# Patient Record
Sex: Female | Born: 1965 | Race: Black or African American | Hispanic: No | Marital: Single | Smoking: Former smoker
Health system: Southern US, Community
[De-identification: ages and names within clinical notes are randomized; demographics above are authoritative.]

## PROBLEM LIST (undated history)

## (undated) DIAGNOSIS — G4730 Sleep apnea, unspecified: Secondary | ICD-10-CM

## (undated) DIAGNOSIS — I272 Pulmonary hypertension, unspecified: Secondary | ICD-10-CM

## (undated) DIAGNOSIS — Z803 Family history of malignant neoplasm of breast: Secondary | ICD-10-CM

## (undated) DIAGNOSIS — I10 Essential (primary) hypertension: Secondary | ICD-10-CM

## (undated) DIAGNOSIS — F32A Depression, unspecified: Secondary | ICD-10-CM

## (undated) DIAGNOSIS — C801 Malignant (primary) neoplasm, unspecified: Secondary | ICD-10-CM

## (undated) DIAGNOSIS — F419 Anxiety disorder, unspecified: Secondary | ICD-10-CM

## (undated) DIAGNOSIS — J449 Chronic obstructive pulmonary disease, unspecified: Secondary | ICD-10-CM

## (undated) DIAGNOSIS — S83249A Other tear of medial meniscus, current injury, unspecified knee, initial encounter: Secondary | ICD-10-CM

## (undated) DIAGNOSIS — Z87828 Personal history of other (healed) physical injury and trauma: Secondary | ICD-10-CM

## (undated) DIAGNOSIS — D8685 Sarcoid myocarditis: Secondary | ICD-10-CM

## (undated) DIAGNOSIS — M1990 Unspecified osteoarthritis, unspecified site: Secondary | ICD-10-CM

## (undated) DIAGNOSIS — R0600 Dyspnea, unspecified: Secondary | ICD-10-CM

## (undated) DIAGNOSIS — Z780 Asymptomatic menopausal state: Secondary | ICD-10-CM

## (undated) DIAGNOSIS — Z800 Family history of malignant neoplasm of digestive organs: Secondary | ICD-10-CM

## (undated) DIAGNOSIS — I509 Heart failure, unspecified: Secondary | ICD-10-CM

## (undated) DIAGNOSIS — M797 Fibromyalgia: Secondary | ICD-10-CM

## (undated) DIAGNOSIS — F319 Bipolar disorder, unspecified: Secondary | ICD-10-CM

## (undated) DIAGNOSIS — I219 Acute myocardial infarction, unspecified: Secondary | ICD-10-CM

## (undated) HISTORY — DX: Pulmonary hypertension, unspecified: I27.20

## (undated) HISTORY — DX: Anxiety disorder, unspecified: F41.9

## (undated) HISTORY — PX: OTHER SURGICAL HISTORY: SHX169

## (undated) HISTORY — DX: Family history of malignant neoplasm of breast: Z80.3

## (undated) HISTORY — DX: Bipolar disorder, unspecified: F31.9

## (undated) HISTORY — DX: Depression, unspecified: F32.A

## (undated) HISTORY — DX: Essential (primary) hypertension: I10

## (undated) HISTORY — PX: TUBAL LIGATION: SHX77

## (undated) HISTORY — DX: Sarcoid myocarditis: D86.85

## (undated) HISTORY — DX: Family history of malignant neoplasm of digestive organs: Z80.0

## (undated) HISTORY — DX: Other tear of medial meniscus, current injury, unspecified knee, initial encounter: S83.249A

## (undated) HISTORY — PX: LUNG BIOPSY: SHX232

## (undated) HISTORY — DX: Fibromyalgia: M79.7

## (undated) HISTORY — PX: CARDIAC CATHETERIZATION: SHX172

## (undated) HISTORY — DX: Asymptomatic menopausal state: Z78.0

## (undated) HISTORY — DX: Personal history of other (healed) physical injury and trauma: Z87.828

## (undated) HISTORY — PX: UPPER GI ENDOSCOPY: SHX6162

---

## 2010-01-07 DIAGNOSIS — I272 Pulmonary hypertension, unspecified: Secondary | ICD-10-CM | POA: Insufficient documentation

## 2016-02-16 DIAGNOSIS — F319 Bipolar disorder, unspecified: Secondary | ICD-10-CM | POA: Insufficient documentation

## 2020-06-07 DIAGNOSIS — R002 Palpitations: Secondary | ICD-10-CM | POA: Diagnosis not present

## 2020-06-07 DIAGNOSIS — Z95818 Presence of other cardiac implants and grafts: Secondary | ICD-10-CM | POA: Diagnosis not present

## 2020-06-16 DIAGNOSIS — I272 Pulmonary hypertension, unspecified: Secondary | ICD-10-CM | POA: Diagnosis not present

## 2020-06-16 DIAGNOSIS — Z862 Personal history of diseases of the blood and blood-forming organs and certain disorders involving the immune mechanism: Secondary | ICD-10-CM | POA: Diagnosis not present

## 2020-06-16 DIAGNOSIS — M069 Rheumatoid arthritis, unspecified: Secondary | ICD-10-CM | POA: Diagnosis not present

## 2020-06-16 DIAGNOSIS — E559 Vitamin D deficiency, unspecified: Secondary | ICD-10-CM | POA: Diagnosis not present

## 2020-06-16 DIAGNOSIS — Z1159 Encounter for screening for other viral diseases: Secondary | ICD-10-CM | POA: Diagnosis not present

## 2020-06-16 DIAGNOSIS — Z87898 Personal history of other specified conditions: Secondary | ICD-10-CM | POA: Diagnosis not present

## 2020-06-16 DIAGNOSIS — Z79899 Other long term (current) drug therapy: Secondary | ICD-10-CM | POA: Diagnosis not present

## 2020-06-16 DIAGNOSIS — Z0000 Encounter for general adult medical examination without abnormal findings: Secondary | ICD-10-CM | POA: Diagnosis not present

## 2020-08-03 ENCOUNTER — Telehealth: Payer: Self-pay

## 2020-08-03 NOTE — Telephone Encounter (Signed)
Progress notes on file sent from: [facility], phone no. [phone]. Copy of referral order sent to scheduling.

## 2020-08-09 ENCOUNTER — Institutional Professional Consult (permissible substitution): Payer: Medicare HMO | Admitting: Internal Medicine

## 2020-08-09 DIAGNOSIS — R002 Palpitations: Secondary | ICD-10-CM | POA: Diagnosis not present

## 2020-08-09 DIAGNOSIS — Z95818 Presence of other cardiac implants and grafts: Secondary | ICD-10-CM | POA: Diagnosis not present

## 2020-08-11 DIAGNOSIS — Z113 Encounter for screening for infections with a predominantly sexual mode of transmission: Secondary | ICD-10-CM | POA: Diagnosis not present

## 2020-08-11 DIAGNOSIS — Z1211 Encounter for screening for malignant neoplasm of colon: Secondary | ICD-10-CM | POA: Diagnosis not present

## 2020-08-11 DIAGNOSIS — R6889 Other general symptoms and signs: Secondary | ICD-10-CM | POA: Diagnosis not present

## 2020-08-11 DIAGNOSIS — Z01411 Encounter for gynecological examination (general) (routine) with abnormal findings: Secondary | ICD-10-CM | POA: Diagnosis not present

## 2020-08-11 DIAGNOSIS — Z6841 Body Mass Index (BMI) 40.0 and over, adult: Secondary | ICD-10-CM | POA: Diagnosis not present

## 2020-08-11 DIAGNOSIS — Z124 Encounter for screening for malignant neoplasm of cervix: Secondary | ICD-10-CM | POA: Diagnosis not present

## 2020-08-11 DIAGNOSIS — Z1231 Encounter for screening mammogram for malignant neoplasm of breast: Secondary | ICD-10-CM | POA: Diagnosis not present

## 2020-08-11 DIAGNOSIS — Z01419 Encounter for gynecological examination (general) (routine) without abnormal findings: Secondary | ICD-10-CM | POA: Diagnosis not present

## 2020-08-16 DIAGNOSIS — G4730 Sleep apnea, unspecified: Secondary | ICD-10-CM | POA: Diagnosis not present

## 2020-08-16 DIAGNOSIS — M5459 Other low back pain: Secondary | ICD-10-CM | POA: Diagnosis not present

## 2020-08-16 DIAGNOSIS — M25569 Pain in unspecified knee: Secondary | ICD-10-CM | POA: Diagnosis not present

## 2020-08-16 DIAGNOSIS — M2550 Pain in unspecified joint: Secondary | ICD-10-CM | POA: Diagnosis not present

## 2020-08-16 DIAGNOSIS — M797 Fibromyalgia: Secondary | ICD-10-CM | POA: Diagnosis not present

## 2020-08-16 DIAGNOSIS — D869 Sarcoidosis, unspecified: Secondary | ICD-10-CM | POA: Diagnosis not present

## 2020-08-16 DIAGNOSIS — M542 Cervicalgia: Secondary | ICD-10-CM | POA: Diagnosis not present

## 2020-08-16 DIAGNOSIS — R2689 Other abnormalities of gait and mobility: Secondary | ICD-10-CM | POA: Diagnosis not present

## 2020-08-16 DIAGNOSIS — J449 Chronic obstructive pulmonary disease, unspecified: Secondary | ICD-10-CM | POA: Diagnosis not present

## 2020-08-24 DIAGNOSIS — R2689 Other abnormalities of gait and mobility: Secondary | ICD-10-CM | POA: Diagnosis not present

## 2020-08-24 DIAGNOSIS — J449 Chronic obstructive pulmonary disease, unspecified: Secondary | ICD-10-CM | POA: Diagnosis not present

## 2020-09-01 ENCOUNTER — Institutional Professional Consult (permissible substitution): Payer: Medicare HMO | Admitting: Pulmonary Disease

## 2020-09-01 NOTE — Progress Notes (Deleted)
Synopsis: Referred in July 2022 for Sarcoidosis, self-referral  Subjective:   PATIENT ID: Julie Cruz GENDER: female DOB: August 09, 1965, MRN: [id number]   HPI  No chief complaint on file.   Julie Cruz is a 55 year old woman,   No past medical history on file.   No family history on file.   Social History   Socioeconomic History   Marital status: Not on file    Spouse name: Not on file   Number of children: Not on file   Years of education: Not on file   Highest education level: Not on file  Occupational History   Not on file  Tobacco Use   Smoking status: Not on file   Smokeless tobacco: Not on file  Substance and Sexual Activity   Alcohol use: Not on file   Drug use: Not on file   Sexual activity: Not on file  Other Topics Concern   Not on file  Social History Narrative   Not on file   Social Determinants of Health   Financial Resource Strain: Not on file  Food Insecurity: Not on file  Transportation Needs: Not on file  Physical Activity: Not on file  Stress: Not on file  Social Connections: Not on file  Intimate Partner Violence: Not on file     Not on File   No outpatient medications prior to visit.   No facility-administered medications prior to visit.    Review of Systems  Constitutional:  Negative for chills, fever, malaise/fatigue and weight loss.  HENT:  Negative for congestion, sinus pain and sore throat.   Eyes: Negative.   Respiratory:  Negative for cough, hemoptysis, sputum production, shortness of breath and wheezing.   Cardiovascular:  Negative for chest pain, palpitations, orthopnea, claudication and leg swelling.  Gastrointestinal:  Negative for abdominal pain, heartburn, nausea and vomiting.  Genitourinary: Negative.   Musculoskeletal:  Negative for joint pain and myalgias.  Skin:  Negative for rash.  Neurological:  Negative for weakness.  Endo/Heme/Allergies: Negative.   Psychiatric/Behavioral: Negative.        Objective:  There were no vitals filed for this visit.   Physical Exam Constitutional:      General: She is not in acute distress.    Appearance: She is not ill-appearing.  HENT:     Head: Normocephalic and atraumatic.  Eyes:     General: No scleral icterus.    Conjunctiva/sclera: Conjunctivae normal.     Pupils: Pupils are equal, round, and reactive to light.  Cardiovascular:     Rate and Rhythm: Normal rate and regular rhythm.     Pulses: Normal pulses.     Heart sounds: Normal heart sounds. No murmur heard. Pulmonary:     Effort: Pulmonary effort is normal.     Breath sounds: Normal breath sounds. No wheezing, rhonchi or rales.  Abdominal:     General: Bowel sounds are normal.     Palpations: Abdomen is soft.  Musculoskeletal:     Right lower leg: No edema.     Left lower leg: No edema.  Lymphadenopathy:     Cervical: No cervical adenopathy.  Skin:    General: Skin is warm and dry.  Neurological:     General: No focal deficit present.     Mental Status: She is alert.  Psychiatric:        Mood and Affect: Mood normal.        Behavior: Behavior normal.  Thought Content: Thought content normal.        Judgment: Judgment normal.      CBC No results found for: WBC, RBC, HGB, HCT, PLT, MCV, MCH, MCHC, RDW, LYMPHSABS, MONOABS, EOSABS, BASOSABS   Chest imaging:  PFT: No flowsheet data found.  Labs:  Path:  Echo:  Heart Catheterization:       Assessment & Plan:   No diagnosis found.  Discussion: ***   No current outpatient medications on file.

## 2020-09-09 DIAGNOSIS — Z95818 Presence of other cardiac implants and grafts: Secondary | ICD-10-CM | POA: Diagnosis not present

## 2020-09-09 DIAGNOSIS — R002 Palpitations: Secondary | ICD-10-CM | POA: Diagnosis not present

## 2020-09-14 ENCOUNTER — Other Ambulatory Visit: Payer: Self-pay

## 2020-09-15 ENCOUNTER — Ambulatory Visit: Payer: Medicare HMO | Admitting: Cardiology

## 2020-09-15 ENCOUNTER — Encounter: Payer: Self-pay | Admitting: Cardiology

## 2020-09-15 ENCOUNTER — Other Ambulatory Visit: Payer: Self-pay

## 2020-09-15 VITALS — BP 118/80 | HR 83 | Ht 71.0 in | Wt 366.0 lb

## 2020-09-15 DIAGNOSIS — R0609 Other forms of dyspnea: Secondary | ICD-10-CM

## 2020-09-15 DIAGNOSIS — D869 Sarcoidosis, unspecified: Secondary | ICD-10-CM | POA: Diagnosis not present

## 2020-09-15 DIAGNOSIS — R55 Syncope and collapse: Secondary | ICD-10-CM | POA: Diagnosis not present

## 2020-09-15 DIAGNOSIS — Z95818 Presence of other cardiac implants and grafts: Secondary | ICD-10-CM | POA: Insufficient documentation

## 2020-09-15 DIAGNOSIS — R0600 Dyspnea, unspecified: Secondary | ICD-10-CM

## 2020-09-15 NOTE — Patient Instructions (Signed)
Medication Instructions:  Your physician recommends that you continue on your current medications as directed. Please refer to the Current Medication list given to you today.  *If you need a refill on your cardiac medications before your next appointment, please call your pharmacy*   Lab Work: None If you have labs (blood work) drawn today and your tests are completely normal, you will receive your results only by: MyChart Message (if you have MyChart) OR A paper copy in the mail If you have any lab test that is abnormal or we need to change your treatment, we will call you to review the results.   Testing/Procedures: Your physician has requested that you have an echocardiogram. Echocardiography is a painless test that uses sound waves to create images of your heart. It provides your doctor with information about the size and shape of your heart and how well your heart's chambers and valves are working. This procedure takes approximately one hour. There are no restrictions for this procedure.    Follow-Up: At [facility], you and your health needs are our priority.  As part of our continuing mission to provide you with exceptional heart care, we have created designated Provider Care Teams.  These Care Teams include your primary Cardiologist (physician) and Advanced Practice Providers (APPs -  Physician Assistants and Nurse Practitioners) who all work together to provide you with the care you need, when you need it.  We recommend signing up for the patient portal called "MyChart".  Sign up information is provided on this After Visit Summary.  MyChart is used to connect with patients for Virtual Visits (Telemedicine).  Patients are able to view lab/test results, encounter notes, upcoming appointments, etc.  Non-urgent messages can be sent to your provider as well.   To learn more about what you can do with MyChart, go to [email or web address].    Your next appointment:   2  month(s)  The format for your next appointment:   In Person  Provider:   Robert Krasowski, MD   Other Instructions  Echocardiogram An echocardiogram is a test that uses sound waves (ultrasound) to produce images of the heart. Images from an echocardiogram can provide important information about: Heart size and shape. The size and thickness and movement of your heart's walls. Heart muscle function and strength. Heart valve function or if you have stenosis. Stenosis is when the heart valves are too narrow. If blood is flowing backward through the heart valves (regurgitation). A tumor or infectious growth around the heart valves. Areas of heart muscle that are not working well because of poor blood flow or injury from a heart attack. Aneurysm detection. An aneurysm is a weak or damaged part of an artery wall. The wall bulges out from the normal force of blood pumping through the body. Tell a health care provider about: Any allergies you have. All medicines you are taking, including vitamins, herbs, eye drops, creams, and over-the-counter medicines. Any blood disorders you have. Any surgeries you have had. Any medical conditions you have. Whether you are pregnant or may be pregnant. What are the risks? Generally, this is a safe test. However, problems may occur, including an allergic reaction to dye (contrast) that may be used during the test. What happens before the test? No specific preparation is needed. You may eat and drink normally. What happens during the test?  You will take off your clothes from the waist up and put on a hospital gown. Electrodes or electrocardiogram (ECG)patches may be placed   on your chest. The electrodes or patches are then connected to a device that monitors your heart rate and rhythm. You will lie down on a table for an ultrasound exam. A gel will be applied to your chest to help sound waves pass through your skin. A handheld device, called a  transducer, will be pressed against your chest and moved over your heart. The transducer produces sound waves that travel to your heart and bounce back (or "echo" back) to the transducer. These sound waves will be captured in real-time and changed into images of your heart that can be viewed on a video monitor. The images will be recorded on a computer and reviewed by your health care provider. You may be asked to change positions or hold your breath for a short time. This makes it easier to get different views or better views of your heart. In some cases, you may receive contrast through an IV in one of your veins. This can improve the quality of the pictures from your heart. The procedure may vary among health care providers and hospitals. What can I expect after the test? You may return to your normal, everyday life, including diet, activities, and medicines, unless your health care provider tells you not to do that. Follow these instructions at home: It is up to you to get the results of your test. Ask your health care provider, or the department that is doing the test, when your results will be ready. Keep all follow-up visits. This is important. Summary An echocardiogram is a test that uses sound waves (ultrasound) to produce images of the heart. Images from an echocardiogram can provide important information about the size and shape of your heart, heart muscle function, heart valve function, and other possible heart problems. You do not need to do anything to prepare before this test. You may eat and drink normally. After the echocardiogram is completed, you may return to your normal, everyday life, unless your health care provider tells you not to do that. This information is not intended to replace advice given to you by your health care provider. Make sure you discuss any questions you have with your health care provider. Document Revised: 10/07/2019 Document Reviewed: 10/07/2019 Elsevier  Patient Education  2022 Elsevier Inc.   

## 2020-09-15 NOTE — Progress Notes (Signed)
Cardiology Consultation:    Date:  09/15/2020   ID:  Julie Cruz, DOB 08-19-1965, MRN [id number]  PCP:  Leilani Able, MD  Cardiologist:  Gypsy Balsam, MD   Referring MD: Eliezer Lofts, MD   Chief Complaint  Patient presents with   Loss of Consciousness   Chest Pain    History of Present Illness:    Julie Cruz is a 55 y.o. female who is being seen today for the evaluation of sarcoidosis, cardiac involvement at the request of Eliezer Lofts, MD. she is a lady who recently relocated from [place].  She was taking care of by [facility].  Her diagnoses include sarcoidosis, there is a pulmonary involvement as well as eye involvement of this disease.  She has been followed by cardiology from [place] I do have a pet scan from 2020 showing no activity within the heart, I do have also report of cardiac catheterization from 2019 showing no obstructive disease.  She does have implantable loop recorder because of episode of syncope.  It looks like this is a [employer] device.  She would like to be established as a patient in my practice.  She did have a lot of complaints she complained of being tired exhausted short of breath.  She complained of having multiple episode of syncope she said for years she has been passing out and this is one of the reason why implantable loop recorder has been inserted however it looks like so far no significant arrhythmia identified.  She said syncope typically happen when she tried to get up quickly and walk.  It never happened when she laid down does not happen too often when she is sitting down.  She never injured herself, she usually feels that this sensation is coming takes couple seconds to a minute before eventually she ended up passing.  She described also to have atypical chest pain not related to exercise sharp stabbing. She does not exercise on the regular basis She does not smoke She is not on any special diet  Past  Medical History:  Diagnosis Date   Acute medial meniscus tear    Anxiety    Bipolar 1 disorder (HCC)    Depression    Fibromyalgia    H/O spinal cord injury    T, L ans CSpine   Hypertension    Menopause    Pulmonary hypertension (HCC)    Sarcoid, cardiac       Current Medications: Current Meds  Medication Sig   acetaminophen (TYLENOL) 650 MG CR tablet Take 650 mg by mouth every 8 (eight) hours as needed for pain.   albuterol (VENTOLIN HFA) 108 (90 Base) MCG/ACT inhaler Inhale 2 puffs into the lungs every 6 (six) hours as needed for wheezing or shortness of breath.   aspirin 325 MG tablet Take 325 mg by mouth daily.   Brimonidine Tartrate (ALPHAGAN P OP) Apply 5 mLs to eye daily at 6 (six) AM.   clonazePAM (KLONOPIN) 0.5 MG tablet Take 0.5 mg by mouth daily.   cyclobenzaprine (FLEXERIL) 10 MG tablet Take 10 mg by mouth 3 (three) times daily as needed for muscle spasms.   hydrochlorothiazide (HYDRODIURIL) 50 MG tablet Take 50 mg by mouth daily.   lisinopril (ZESTRIL) 40 MG tablet Take 40 mg by mouth daily.   methocarbamol (ROBAXIN) 750 MG tablet Take 750 mg by mouth every 8 (eight) hours as needed for muscle spasms.   naproxen (NAPROSYN) 500  MG tablet Take 500 mg by mouth daily.   pantoprazole (PROTONIX) 40 MG tablet Take 40 mg by mouth daily.   potassium chloride (KLOR-CON) 10 MEQ tablet Take 20 mEq by mouth daily.   prednisoLONE acetate (PRED FORTE) 1 % ophthalmic suspension Place 1 drop into both eyes as directed. 1-2 daily   sertraline (ZOLOFT) 100 MG tablet Take 200 mg by mouth daily.   traZODone (DESYREL) 50 MG tablet Take 50 mg by mouth at bedtime.   verapamil (VERELAN PM) 360 MG 24 hr capsule Take 360 mg by mouth at bedtime.     Allergies:   Patient has no known allergies.   Social History   Socioeconomic History   Marital status: Single    Spouse name: Not on file   Number of children: Not on file   Years of education: Not on file   Highest education level: Not  on file  Occupational History   Not on file  Tobacco Use   Smoking status: Former    Types: Cigarettes   Smokeless tobacco: Never  Vaping Use   Vaping Use: Never used  Substance and Sexual Activity   Alcohol use: Yes    Alcohol/week: 2.0 standard drinks    Types: 2 Glasses of wine per week    Comment: Daily   Drug use: Never   Sexual activity: Not Currently  Other Topics Concern   Not on file  Social History Narrative   Not on file   Social Determinants of Health   Financial Resource Strain: Not on file  Food Insecurity: Not on file  Transportation Needs: Not on file  Physical Activity: Not on file  Stress: Not on file  Social Connections: Not on file     Family History: The patient's family history includes Asthma in her mother; Cancer in her father; Depression in her brother and mother; HIV/AIDS in her brother; Heart attack in her brother; Hyperlipidemia in her mother; Rheum arthritis in her mother; Thyroid disease in her mother. ROS:   Please see the history of present illness.    All 14 point review of systems negative except as described per history of present illness.  EKGs/Labs/Other Studies Reviewed:    The following studies were reviewed today: I did review her PET scan that she presented to me done in 2020 showing no activity within the heart. I did review cardiac catheterization done in 2019 showed nonobstructive disease  EKG:  EKG is  ordered today.  The ekg ordered today demonstrates normal sinus rhythm normal P interval QT interval corrected is 493 which is prolonged.  Recent Labs: No results found for requested labs within last 8760 hours.  Recent Lipid Panel No results found for: CHOL, TRIG, HDL, CHOLHDL, VLDL, LDLCALC, LDLDIRECT  Physical Exam:    VS:  BP 118/80 (BP Location: Right Arm, Patient Position: Sitting)   Pulse 83   Ht 5\' 11"  (1.803 m)   Wt (!) 366 lb (166 kg)   SpO2 93%   BMI 51.05 kg/m     Wt Readings from Last 3 Encounters:   09/15/20 (!) 366 lb (166 kg)     GEN:  Well nourished, well developed in no acute distress HEENT: Normal NECK: No JVD; No carotid bruits LYMPHATICS: No lymphadenopathy CARDIAC: RRR, no murmurs, no rubs, no gallops RESPIRATORY:  Clear to auscultation without rales, wheezing or rhonchi  ABDOMEN: Soft, non-tender, non-distended MUSCULOSKELETAL:  No edema; No deformity  SKIN: Warm and dry NEUROLOGIC:  Alert and oriented x  3 PSYCHIATRIC:  Normal affect   ASSESSMENT:    1. DOE (dyspnea on exertion)   2. Sarcoidosis   3. Status post placement of implantable loop recorder   4. Syncope and collapse   5. Dyspnea on exertion   6. Morbid obesity (HCC)    PLAN:    In order of problems listed above:  Dyspnea on exertion multifactorial, pulmonary sarcoidosis, morbid obesity, but immobility all contribute to this problem.  I will ask her to have an echocardiogram to assess left ventricle ejection fraction. Sarcoidosis.  She is being scheduled to follow-up with pulmonary.  She also got some eye involvement she does have involvement with eye doctor.  She also carries diagnosis of sleep apnea.  That should be addressed by pulmonary team. Multiple syncope she does have implantable loop recorder so far did not reveal any significant arrhythmia however this is according to the patient I will contact her cardiologist from [facility] and try to get her record, will also schedule her to see our EP team for interrogation of her device.  This is already 53 years old device so she may be running out of battery.  I am concerned about her prolongation of QT interval noted on the EKG.  She takes some medications that can contribute to prolongation of QT and that include trazodone, Zoloft.  Those medication can cause prolongation of QT interval in certain situations.  Luckily again no arrhythmia is identified but of course types of syncope are very concerning. Morbid obesity: A problem.  She understand that she  is trying to work on losing weight   Medication Adjustments/Labs and Tests Ordered: Current medicines are reviewed at length with the patient today.  Concerns regarding medicines are outlined above.  Orders Placed This Encounter  Procedures   EKG 12-Lead   ECHOCARDIOGRAM COMPLETE   No orders of the defined types were placed in this encounter.   Signed, Georgeanna Lea, MD, [facility]. 09/15/2020 12:59 PM    [facility]

## 2020-09-22 DIAGNOSIS — R0602 Shortness of breath: Secondary | ICD-10-CM | POA: Diagnosis not present

## 2020-09-22 DIAGNOSIS — R6889 Other general symptoms and signs: Secondary | ICD-10-CM | POA: Diagnosis not present

## 2020-09-22 DIAGNOSIS — D869 Sarcoidosis, unspecified: Secondary | ICD-10-CM | POA: Diagnosis not present

## 2020-09-22 DIAGNOSIS — Z1211 Encounter for screening for malignant neoplasm of colon: Secondary | ICD-10-CM | POA: Diagnosis not present

## 2020-09-22 DIAGNOSIS — R079 Chest pain, unspecified: Secondary | ICD-10-CM | POA: Diagnosis not present

## 2020-09-22 DIAGNOSIS — K219 Gastro-esophageal reflux disease without esophagitis: Secondary | ICD-10-CM | POA: Diagnosis not present

## 2020-09-23 DIAGNOSIS — J449 Chronic obstructive pulmonary disease, unspecified: Secondary | ICD-10-CM | POA: Diagnosis not present

## 2020-09-23 DIAGNOSIS — R2689 Other abnormalities of gait and mobility: Secondary | ICD-10-CM | POA: Diagnosis not present

## 2020-09-28 ENCOUNTER — Other Ambulatory Visit: Payer: Self-pay

## 2020-09-28 ENCOUNTER — Ambulatory Visit (HOSPITAL_COMMUNITY): Payer: Medicare HMO | Attending: Cardiology

## 2020-09-28 DIAGNOSIS — R0600 Dyspnea, unspecified: Secondary | ICD-10-CM | POA: Insufficient documentation

## 2020-09-28 DIAGNOSIS — R6889 Other general symptoms and signs: Secondary | ICD-10-CM | POA: Diagnosis not present

## 2020-09-28 DIAGNOSIS — R0609 Other forms of dyspnea: Secondary | ICD-10-CM

## 2020-09-28 LAB — ECHOCARDIOGRAM COMPLETE
Area-P 1/2: 3.48 cm2
S' Lateral: 2.3 cm

## 2020-09-28 MED ORDER — PERFLUTREN LIPID MICROSPHERE
1.0000 mL | INTRAVENOUS | Status: AC | PRN
  Administered 2020-09-28: 3 mL via INTRAVENOUS

## 2020-09-29 ENCOUNTER — Other Ambulatory Visit: Payer: Self-pay | Admitting: Gastroenterology

## 2020-09-29 ENCOUNTER — Telehealth: Payer: Self-pay

## 2020-09-29 NOTE — Telephone Encounter (Signed)
   [facility] Pre-operative Risk Assessment    Patient Name: Julie Cruz  DOB: Dec 01, 1965 MRN: [id number]  HEARTCARE STAFF:  - IMPORTANT!!!!!! Under Visit Info/Reason for Call, type in Other and utilize the format Clearance MM/DD/YY or Clearance TBD. Do not use dashes or single digits. - Please review there is not already an duplicate clearance open for this procedure. - If request is for dental extraction, please clarify the # of teeth to be extracted. - If the patient is currently at the dentist's office, call Pre-Op Callback Staff (MA/nurse) to input urgent request.  - If the patient is not currently in the dentist office, please route to the Pre-Op pool.  Request for surgical clearance:  What type of surgery is being performed?  COLONOSCOPY / When is this surgery scheduled?  10/15/2020  What type of clearance is required (medical clearance vs. Pharmacy clearance to hold med vs. Both)? Both  Are there any medications that need to be held prior to surgery and how long? NA  Practice name and name of physician performing surgery? [facility] [place] Dr Beryle Beams, MD  What is the office phone number? [phone]   7.   What is the office fax number? [phone]  8.   Anesthesia type (None, local, MAC, general) ? Propofol   Toni Arthurs 09/29/2020, 2:16 PM  _________________________________________________________________   (provider comments below)

## 2020-09-29 NOTE — Telephone Encounter (Signed)
   Name: Julie Cruz  DOB: 11-Feb-1966  MRN: [id number]   Primary Cardiologist: None  Chart reviewed as part of pre-operative protocol coverage. Patient was contacted 09/29/2020 in reference to pre-operative risk assessment for pending surgery as outlined below.  BERNIECE ABID was last seen on 09/15/2020 by Dr. Bing Matter.  Since that day, LACHRISHA ZIEBARTH has been stable without significant chest pain.  Her baseline dyspnea on exertion is unchanged.  She has a history of sarcoidosis.  Previous cardiac catheterization in 2019 did not reveal significant coronary artery disease.  Recent echocardiogram performed on 09/28/2020 was also reassuring.  Therefore, based on ACC/AHA guidelines, the patient would be at acceptable risk for the planned procedure without further cardiovascular testing.   The patient was advised that if she develops new symptoms prior to surgery to contact our office to arrange for a follow-up visit, and she verbalized understanding.  I will route this recommendation to the requesting party via Epic fax function and remove from pre-op pool. Please call with questions.  [place], [place] 09/29/2020, 4:05 PM

## 2020-09-29 NOTE — Telephone Encounter (Signed)
Information to request medical records from Dr. Bethann Punches Electrophysiologist and Pulmonologist Dr. Miquel Dunn give to Julie Cruz to complete the request   EP doctor phone: [phone] fax#[phone] att [name]  Pulmonologist phone: [phone]  fax# [phone] att [name]

## 2020-10-06 ENCOUNTER — Ambulatory Visit (INDEPENDENT_AMBULATORY_CARE_PROVIDER_SITE_OTHER): Payer: Medicare HMO | Admitting: Pulmonary Disease

## 2020-10-06 ENCOUNTER — Other Ambulatory Visit: Payer: Self-pay

## 2020-10-06 ENCOUNTER — Encounter: Payer: Self-pay | Admitting: Pulmonary Disease

## 2020-10-06 ENCOUNTER — Telehealth: Payer: Self-pay | Admitting: Pulmonary Disease

## 2020-10-06 VITALS — BP 128/86 | HR 85 | Ht 71.0 in | Wt 362.0 lb

## 2020-10-06 DIAGNOSIS — D869 Sarcoidosis, unspecified: Secondary | ICD-10-CM | POA: Diagnosis not present

## 2020-10-06 DIAGNOSIS — R0602 Shortness of breath: Secondary | ICD-10-CM | POA: Diagnosis not present

## 2020-10-06 MED ORDER — ALBUTEROL SULFATE HFA 108 (90 BASE) MCG/ACT IN AERS
2.0000 | INHALATION_SPRAY | Freq: Four times a day (QID) | RESPIRATORY_TRACT | 3 refills | Status: DC | PRN
Start: 2020-10-06 — End: 2023-03-07

## 2020-10-06 NOTE — Progress Notes (Signed)
f  Synopsis: Referred in August 2022 for Sarcoidosis   Subjective:   PATIENT ID: Julie Cruz GENDER: female DOB: 1965-04-17, MRN: [id number]   HPI  Chief Complaint  Patient presents with   Consult    Self referral for sarcoidosis. Was diagnosed in 2007 in [place]. States she has noticed some increased SOB especially with exertion. Also has a history of OSA. Has a cpap machine but has not used it since it was under recall.     Julie Cruz is a 55 year old woman, former smoker with hypertension, pulmonary hypertension, fibromyalgia and sarcoidosis who is referred to pulmonary clinic for evaluation of sarcoidosis.  She reports being diagnosed with sarcoidosis at age 55 via lung biopsy in 2007 at [facility] in [place].  She was initially on prednisone for treatment but then due to side effects has stopped this therapy and has been weary of using methotrexate so she has not required systemic therapy over the last few years.  She denies progressive dyspnea since deciding not to have systemic treatment of her sarcoidosis. She is currently using albuterol twice daily for shortness of breath.  She was previously on Advair but did not like this inhaler.   She has history of obstructive sleep apnea and has a CPAP machine but has not used it in months as it is one of the machines included in the recent recall.  She has gained 20 to 40 pounds over the last couple of years.  Past Medical History:  Diagnosis Date   Acute medial meniscus tear    Anxiety    Bipolar 1 disorder (HCC)    Depression    Fibromyalgia    H/O spinal cord injury    T, L ans CSpine   Hypertension    Menopause    Pulmonary hypertension (HCC)    Sarcoid, cardiac      Family History  Problem Relation Age of Onset   Hyperlipidemia Mother    Asthma Mother    Thyroid disease Mother    Depression Mother    Rheum arthritis Mother    Cancer Father    Heart attack Brother    Depression Brother     HIV/AIDS Brother      Social History   Socioeconomic History   Marital status: Single    Spouse name: Not on file   Number of children: Not on file   Years of education: Not on file   Highest education level: Not on file  Occupational History   Not on file  Tobacco Use   Smoking status: Former    Types: Cigarettes   Smokeless tobacco: Never  Vaping Use   Vaping Use: Never used  Substance and Sexual Activity   Alcohol use: Yes    Alcohol/week: 2.0 standard drinks    Types: 2 Glasses of wine per week    Comment: Daily   Drug use: Never   Sexual activity: Not Currently  Other Topics Concern   Not on file  Social History Narrative   Not on file   Social Determinants of Health   Financial Resource Strain: Not on file  Food Insecurity: Not on file  Transportation Needs: Not on file  Physical Activity: Not on file  Stress: Not on file  Social Connections: Not on file  Intimate Partner Violence: Not on file     No Known Allergies   Outpatient Medications Prior to Visit  Medication Sig Dispense Refill   acetaminophen (TYLENOL) 650  MG CR tablet Take 650 mg by mouth every 8 (eight) hours as needed for pain.     albuterol (VENTOLIN HFA) 108 (90 Base) MCG/ACT inhaler Inhale 2 puffs into the lungs every 6 (six) hours as needed for wheezing or shortness of breath.     aspirin 325 MG tablet Take 325 mg by mouth daily.     Brimonidine Tartrate (ALPHAGAN P OP) Apply 5 mLs to eye daily at 6 (six) AM.     clonazePAM (KLONOPIN) 0.5 MG tablet Take 0.5 mg by mouth daily.     cyclobenzaprine (FLEXERIL) 10 MG tablet Take 10 mg by mouth 3 (three) times daily as needed for muscle spasms.     hydrochlorothiazide (HYDRODIURIL) 50 MG tablet Take 50 mg by mouth daily.     ketorolac (TORADOL) 30 MG/ML injection Inject 30 mg into the vein once.     lisinopril (ZESTRIL) 40 MG tablet Take 40 mg by mouth daily.     methocarbamol (ROBAXIN) 750 MG tablet Take 750 mg by mouth every 8 (eight) hours as  needed for muscle spasms.     naproxen (NAPROSYN) 500 MG tablet Take 500 mg by mouth daily.     pantoprazole (PROTONIX) 40 MG tablet Take 40 mg by mouth daily.     potassium chloride (KLOR-CON) 10 MEQ tablet Take 20 mEq by mouth daily.     prednisoLONE acetate (PRED FORTE) 1 % ophthalmic suspension Place 1 drop into both eyes as directed. 1-2 daily     sertraline (ZOLOFT) 100 MG tablet Take 200 mg by mouth daily.     traZODone (DESYREL) 50 MG tablet Take 50 mg by mouth at bedtime.     verapamil (VERELAN PM) 360 MG 24 hr capsule Take 360 mg by mouth at bedtime.     No facility-administered medications prior to visit.    Review of Systems  Constitutional:  Negative for chills, fever, malaise/fatigue and weight loss.  HENT:  Negative for congestion, sinus pain and sore throat.   Eyes: Negative.   Respiratory:  Positive for cough and shortness of breath. Negative for hemoptysis, sputum production and wheezing.   Cardiovascular:  Negative for chest pain, palpitations, orthopnea, claudication and leg swelling.  Gastrointestinal:  Negative for abdominal pain, heartburn, nausea and vomiting.  Genitourinary: Negative.   Musculoskeletal:  Positive for back pain and joint pain. Negative for myalgias.  Skin:  Negative for rash.  Neurological:  Negative for weakness.  Endo/Heme/Allergies: Negative.   Psychiatric/Behavioral: Negative.     Objective:   Vitals:   10/06/20 1132  BP: 128/86  Pulse: 85  SpO2: 98%  Weight: (!) 362 lb (164.2 kg)  Height: 5\' 11"  (1.803 m)   Physical Exam Constitutional:      General: She is not in acute distress.    Appearance: She is obese. She is not ill-appearing.  HENT:     Head: Normocephalic and atraumatic.     Nose: Nose normal.     Mouth/Throat:     Mouth: Mucous membranes are moist.  Eyes:     General: No scleral icterus.    Conjunctiva/sclera: Conjunctivae normal.     Pupils: Pupils are equal, round, and reactive to light.  Cardiovascular:      Rate and Rhythm: Normal rate and regular rhythm.     Pulses: Normal pulses.     Heart sounds: Normal heart sounds. No murmur heard. Pulmonary:     Effort: Pulmonary effort is normal.     Breath sounds: Decreased breath  sounds present. No wheezing, rhonchi or rales.  Abdominal:     General: Bowel sounds are normal.     Palpations: Abdomen is soft.  Musculoskeletal:     Right lower leg: No edema.     Left lower leg: No edema.  Lymphadenopathy:     Cervical: No cervical adenopathy.  Skin:    General: Skin is warm and dry.  Neurological:     General: No focal deficit present.     Mental Status: She is alert.  Psychiatric:        Mood and Affect: Mood normal.        Behavior: Behavior normal.        Thought Content: Thought content normal.        Judgment: Judgment normal.    CBC No results found for: WBC, RBC, HGB, HCT, PLT, MCV, MCH, MCHC, RDW, LYMPHSABS, MONOABS, EOSABS, BASOSABS   Chest imaging: MRI Cardiac 02/07/2019 - report reviewed from records from [place] Multiple conglomerated lymph nodes within the superior prevascular, right paratracheal and bilateral hilar, precarinal and subcarinal lymph nodes measuring 3.8 x 2.8cm para tracheal nodes. Bilateral multiple subpectoral and axillary lymph nodes measuring up to 1.1 x 0.8cm. New bilateral hypermetabolic diffuse groundglass and subcentimeter nodular opacities.   PFT: No flowsheet data found.  Echo 09/28/20: EF 70-75%. Mild LVH. Grade I diastolic dysfunction. RV systolic function is normal. RV size is normal. LA is mildly dilated.   EKG 09/15/20: Normal sinus rhythm, PR 160, QRS 94, Qtc 493  LHC 04/24/2017 - from [place] records LVEDP mildly elevated LV systolic function is normal  Assessment & Plan:   Sarcoidosis - Plan: Pulmonary Function Test, CT Chest High Resolution, Ambulatory referral to Ophthalmology, CANCELED: CT Chest High Resolution  Shortness of breath - Plan: CT Chest High Resolution  Discussion: Julie Cruz is a 55 year old woman, former smoker with hypertension, pulmonary hypertension, fibromyalgia and sarcoidosis who is referred to pulmonary clinic for evaluation of sarcoidosis.  She has reported history of sarcoidosis with confirmed lung biopsy from 2007 at [facility] in [place].  We will request records from her former pulmonologist.  On cardiac MRI from 2020 she does have notable paratracheal, hilar, precarinal and subcarinal along with axillary lymph nodes that are enlarged.  She also had bilateral diffuse groundglass and subcentimeter nodular opacities noted.  From 09/15/2020 showed normal sinus rhythm with normal intervals.  We will check a high-resolution CT scan based on her previous cardiac MRI findings.  We will also schedule her for pulmonary function testing.  We will refer her to ophthalmology for further assessment of her vision.  No plans for systemic therapy of her sarcoidosis at this time.  She can continue albuterol as needed.  Will refer her to our sleep medicine team for further evaluation and management of her obstructive sleep apnea.  We will refer her to medical weight loss management clinic for assistance with her obesity.  Follow-up in 3 months.  Melody Comas, MD [facility] Office: [phone]   Current Outpatient Medications:    acetaminophen (TYLENOL) 650 MG CR tablet, Take 650 mg by mouth every 8 (eight) hours as needed for pain., Disp: , Rfl:    albuterol (VENTOLIN HFA) 108 (90 Base) MCG/ACT inhaler, Inhale 2 puffs into the lungs every 6 (six) hours as needed for wheezing or shortness of breath., Disp: , Rfl:    aspirin 325 MG tablet, Take 325 mg by mouth daily., Disp: , Rfl:  Brimonidine Tartrate (ALPHAGAN P OP), Apply 5 mLs to eye daily at 6 (six) AM., Disp: , Rfl:    clonazePAM (KLONOPIN) 0.5 MG tablet, Take 0.5 mg by mouth daily., Disp: , Rfl:    cyclobenzaprine (FLEXERIL) 10 MG tablet, Take 10 mg by mouth 3  (three) times daily as needed for muscle spasms., Disp: , Rfl:    hydrochlorothiazide (HYDRODIURIL) 50 MG tablet, Take 50 mg by mouth daily., Disp: , Rfl:    ketorolac (TORADOL) 30 MG/ML injection, Inject 30 mg into the vein once., Disp: , Rfl:    lisinopril (ZESTRIL) 40 MG tablet, Take 40 mg by mouth daily., Disp: , Rfl:    methocarbamol (ROBAXIN) 750 MG tablet, Take 750 mg by mouth every 8 (eight) hours as needed for muscle spasms., Disp: , Rfl:    naproxen (NAPROSYN) 500 MG tablet, Take 500 mg by mouth daily., Disp: , Rfl:    pantoprazole (PROTONIX) 40 MG tablet, Take 40 mg by mouth daily., Disp: , Rfl:    potassium chloride (KLOR-CON) 10 MEQ tablet, Take 20 mEq by mouth daily., Disp: , Rfl:    prednisoLONE acetate (PRED FORTE) 1 % ophthalmic suspension, Place 1 drop into both eyes as directed. 1-2 daily, Disp: , Rfl:    sertraline (ZOLOFT) 100 MG tablet, Take 200 mg by mouth daily., Disp: , Rfl:    traZODone (DESYREL) 50 MG tablet, Take 50 mg by mouth at bedtime., Disp: , Rfl:    verapamil (VERELAN PM) 360 MG 24 hr capsule, Take 360 mg by mouth at bedtime., Disp: , Rfl:

## 2020-10-06 NOTE — Telephone Encounter (Signed)
Call made to patient, confirmed DOB. Patient states she was here today and requested that we send in refill of albuterol inhaler. Confirmed pharmacy, refill sent. Patient states she is going to find out what the nebulizer medication she use to use is and she will contact us once she has this information.   Nothing further needed at this time.

## 2020-10-06 NOTE — Patient Instructions (Addendum)
We will place a referral to our medical weight loss management clinic  We will check pulmonary function testing and a CT chest scan to further evaluate your shortness of breath.  We will request records from your former pulmonologist.   Continue albuterol as needed.  We will have you schedule an appointment with our sleep specialists for follow up of your sleep apnea.   We will refer you to ophthalmology

## 2020-10-07 ENCOUNTER — Encounter: Payer: Self-pay | Admitting: Pulmonary Disease

## 2020-10-07 ENCOUNTER — Telehealth: Payer: Self-pay | Admitting: Pulmonary Disease

## 2020-10-07 NOTE — Telephone Encounter (Signed)
Pt stated that for the medication; Duoneb 2.5 MG/ 3 mL solution she said that the pulmonologist from [facility], [facility]; Dr. Sherilyn Banker; must be attention [facility]; fax number [phone]. She signed a release form and she said that the nurse is taking care of it she said that the records should be able to received.   Pls regard; [phone]

## 2020-10-07 NOTE — Telephone Encounter (Signed)
JD please advise if you are ok with sending in the Duoneb 2.5/16ml that the pt was given by her pulmonologist in [place].  She is needing this refilled but it was not mentioned in her last OV note and it is not on her med list.     CP do you have her release form that she said she filled out and signed at her last OV?  Thanks

## 2020-10-08 ENCOUNTER — Telehealth: Payer: Self-pay | Admitting: Cardiology

## 2020-10-08 NOTE — Telephone Encounter (Signed)
Patient is requesting for results for her last test to be mail to her. Please advise

## 2020-10-08 NOTE — Telephone Encounter (Signed)
Ok to send in prescription for duoneb nebulizer solution.  Thanks, Cletis Athens

## 2020-10-08 NOTE — Telephone Encounter (Signed)
Spoke to patient just now and let her know that we could print these for her if she were to come by and sign a release for these or she could get on MyChart and print them herself. She states she will get on MyChart today.    Encouraged patient to call back with any questions or concerns.

## 2020-10-11 ENCOUNTER — Encounter (HOSPITAL_COMMUNITY): Payer: Self-pay | Admitting: Gastroenterology

## 2020-10-11 DIAGNOSIS — R002 Palpitations: Secondary | ICD-10-CM | POA: Diagnosis not present

## 2020-10-11 DIAGNOSIS — Z95818 Presence of other cardiac implants and grafts: Secondary | ICD-10-CM | POA: Diagnosis not present

## 2020-10-11 MED ORDER — IPRATROPIUM-ALBUTEROL 0.5-2.5 (3) MG/3ML IN SOLN: 3.0000 mL | Freq: Four times a day (QID) | RESPIRATORY_TRACT | 6 refills | Status: DC | PRN

## 2020-10-11 NOTE — Telephone Encounter (Signed)
Duoneb has been sent to the pharmacy.  Nothing further is needed.

## 2020-10-14 ENCOUNTER — Other Ambulatory Visit: Payer: Medicare HMO

## 2020-10-14 NOTE — Anesthesia Preprocedure Evaluation (Addendum)
Anesthesia Evaluation  Patient identified by MRN, date of birth, ID band Patient awake    Reviewed: Allergy & Precautions, NPO status , Patient's Chart, lab work & pertinent test results  Airway Mallampati: III  TM Distance: >3 FB Neck ROM: Full    Dental  (+) Teeth Intact   Pulmonary asthma ,  COPD inhaler, former smoker,    Pulmonary exam normal        Cardiovascular hypertension, Pt. on medications  Rhythm:Regular Rate:Normal  sarcoidosis   Neuro/Psych Anxiety Depression Bipolar Disorder negative neurological ROS     GI/Hepatic Neg liver ROS, GERD  Medicated,screening   Endo/Other  negative endocrine ROS  Renal/GU negative Renal ROS  negative genitourinary   Musculoskeletal  (+) Fibromyalgia -  Abdominal (+) + obese,  Abdomen: soft.    Peds  Hematology negative hematology ROS (+)   Anesthesia Other Findings   Reproductive/Obstetrics                            Anesthesia Physical Anesthesia Plan  ASA: 3  Anesthesia Plan: MAC   Post-op Pain Management:    Induction: Intravenous  PONV Risk Score and Plan: Ondansetron and Propofol infusion  Airway Management Planned: Simple Face Mask, Natural Airway and Nasal Cannula  Additional Equipment: None  Intra-op Plan:   Post-operative Plan:   Informed Consent: I have reviewed the patients History and Physical, chart, labs and discussed the procedure including the risks, benefits and alternatives for the proposed anesthesia with the patient or authorized representative who has indicated his/her understanding and acceptance.     Dental advisory given  Plan Discussed with: CRNA  Anesthesia Plan Comments:        Anesthesia Quick Evaluation

## 2020-10-15 ENCOUNTER — Encounter (HOSPITAL_COMMUNITY): Payer: Self-pay | Admitting: Gastroenterology

## 2020-10-15 ENCOUNTER — Ambulatory Visit (HOSPITAL_COMMUNITY): Payer: Medicare HMO | Admitting: Anesthesiology

## 2020-10-15 ENCOUNTER — Other Ambulatory Visit: Payer: Self-pay

## 2020-10-15 ENCOUNTER — Encounter (HOSPITAL_COMMUNITY)
Admission: RE | Disposition: A | Discharge status: Home / Self Care | Payer: Self-pay | Source: Home / Self Care | Attending: Gastroenterology

## 2020-10-15 ENCOUNTER — Ambulatory Visit (HOSPITAL_COMMUNITY)
Admission: RE | Admit: 2020-10-15 | Discharge: 2020-10-15 | Disposition: A | Discharge status: Home / Self Care | Payer: Medicare HMO | Attending: Gastroenterology | Admitting: Gastroenterology

## 2020-10-15 DIAGNOSIS — Z1211 Encounter for screening for malignant neoplasm of colon: Secondary | ICD-10-CM | POA: Diagnosis not present

## 2020-10-15 DIAGNOSIS — I10 Essential (primary) hypertension: Secondary | ICD-10-CM | POA: Diagnosis not present

## 2020-10-15 DIAGNOSIS — I252 Old myocardial infarction: Secondary | ICD-10-CM | POA: Insufficient documentation

## 2020-10-15 DIAGNOSIS — F418 Other specified anxiety disorders: Secondary | ICD-10-CM | POA: Diagnosis not present

## 2020-10-15 DIAGNOSIS — K219 Gastro-esophageal reflux disease without esophagitis: Secondary | ICD-10-CM | POA: Insufficient documentation

## 2020-10-15 DIAGNOSIS — Z87891 Personal history of nicotine dependence: Secondary | ICD-10-CM | POA: Insufficient documentation

## 2020-10-15 DIAGNOSIS — Z79899 Other long term (current) drug therapy: Secondary | ICD-10-CM | POA: Insufficient documentation

## 2020-10-15 DIAGNOSIS — F319 Bipolar disorder, unspecified: Secondary | ICD-10-CM | POA: Diagnosis not present

## 2020-10-15 HISTORY — PX: COLONOSCOPY WITH PROPOFOL: SHX5780

## 2020-10-15 SURGERY — COLONOSCOPY WITH PROPOFOL
Anesthesia: Monitor Anesthesia Care

## 2020-10-15 MED ORDER — DEXMEDETOMIDINE (PRECEDEX) IN NS 20 MCG/5ML (4 MCG/ML) IV SYRINGE
PREFILLED_SYRINGE | INTRAVENOUS | Status: AC
  Filled 2020-10-15: qty 5

## 2020-10-15 MED ORDER — PROPOFOL 500 MG/50ML IV EMUL
INTRAVENOUS | Status: DC | PRN
  Administered 2020-10-15: 150 ug/kg/min via INTRAVENOUS

## 2020-10-15 MED ORDER — PROPOFOL 1000 MG/100ML IV EMUL
INTRAVENOUS | Status: AC
  Filled 2020-10-15: qty 100

## 2020-10-15 MED ORDER — LACTATED RINGERS IV SOLN
INTRAVENOUS | Status: AC | PRN
  Administered 2020-10-15: 1000 mL via INTRAVENOUS

## 2020-10-15 MED ORDER — GLYCOPYRROLATE 0.2 MG/ML IJ SOLN
INTRAMUSCULAR | Status: DC | PRN
  Administered 2020-10-15 (×2): .1 mg via INTRAVENOUS

## 2020-10-15 MED ORDER — LIDOCAINE HCL (CARDIAC) PF 100 MG/5ML IV SOSY
PREFILLED_SYRINGE | INTRAVENOUS | Status: DC | PRN
  Administered 2020-10-15: 100 mg via INTRAVENOUS

## 2020-10-15 SURGICAL SUPPLY — 21 items

## 2020-10-15 NOTE — Transfer of Care (Signed)
Immediate Anesthesia Transfer of Care Note  Patient: Julie Cruz  Procedure(s) Performed: COLONOSCOPY WITH PROPOFOL  Patient Location: PACU  Anesthesia Type:MAC  Level of Consciousness: drowsy and patient cooperative  Airway & Oxygen Therapy: Patient Spontanous Breathing and Patient connected to face mask oxygen  Post-op Assessment: Report given to RN and Post -op Vital signs reviewed and stable  Post vital signs: Reviewed and stable  Last Vitals:  Vitals Value Taken Time  BP    Temp    Pulse 97 10/15/20 0813  Resp 17 10/15/20 0813  SpO2 100 % 10/15/20 0813  Vitals shown include unvalidated device data.  Last Pain:  Vitals:   10/15/20 0720  TempSrc: Temporal  PainSc: 5          Complications: No notable events documented.

## 2020-10-15 NOTE — H&P (Signed)
Julie Cruz HPI: At this time the patient denies any problems with nausea, vomiting, fevers, chills, abdominal pain, diarrhea, hematochezia, melena, or dysphagia. The patient denies any known family history of colon cancers.  At times she will have some constipation.  She uses pantoprazole for her GERD.  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  A loop recorder was implanted in the past and she is followed by Cardiology.  On a routine basis she will have chest pain and  SOB.   Past Medical History:  Diagnosis Date   Acute medial meniscus tear    Anxiety    Bipolar 1 disorder (HCC)    Depression    Fibromyalgia    H/O spinal cord injury    T, L ans CSpine   Hypertension    Menopause    Pulmonary hypertension (HCC)    Sarcoid, cardiac     Past Surgical History:  Procedure Laterality Date   C Section     Catherization      x2   Etopic  pregnacy     LUNG BIOPSY     TUBAL LIGATION      Family History  Problem Relation Age of Onset   Hyperlipidemia Mother    Asthma Mother    Thyroid disease Mother    Depression Mother    Rheum arthritis Mother    Cancer Father    Heart attack Brother    Depression Brother    HIV/AIDS Brother     Social History:  reports that she has quit smoking. Her smoking use included cigarettes. She has never used smokeless tobacco. She reports current alcohol use of about 2.0 standard drinks per week. She reports that she does not use drugs.  Allergies: No Known Allergies  Medications: Scheduled: Continuous:  No results found for this or any previous visit (from the past 24 hour(s)).   No results found.  ROS:  As stated above in the HPI otherwise negative.  Height 5\' 11"  (1.803 m), weight (!) 162.4 kg.    PE: Gen: NAD, Alert and Oriented HEENT:  [place]/AT, EOMI Neck: Supple, no LAD Lungs: CTA Bilaterally CV: RRR without M/G/R ABD: Soft, NTND, +BS Ext: No C/C/E  Assessment/Plan: 1) Screening colonoscopy.  Julie Cruz  D 10/15/2020, 7:06 AM

## 2020-10-15 NOTE — Anesthesia Postprocedure Evaluation (Signed)
Anesthesia Post Note  Patient: Julie Cruz  Procedure(s) Performed: COLONOSCOPY WITH PROPOFOL     Patient location during evaluation: Endoscopy Anesthesia Type: MAC Level of consciousness: awake and alert Pain management: pain level controlled Vital Signs Assessment: post-procedure vital signs reviewed and stable Respiratory status: spontaneous breathing, nonlabored ventilation, respiratory function stable and patient connected to nasal cannula oxygen Cardiovascular status: stable and blood pressure returned to baseline Postop Assessment: no apparent nausea or vomiting Anesthetic complications: no   No notable events documented.  Last Vitals:  Vitals:   10/15/20 0820 10/15/20 0830  BP: 108/61 129/81  Pulse: 94 86  Resp: (!) 26 14  Temp:    SpO2: 96% 94%    Last Pain:  Vitals:   10/15/20 0830  TempSrc:   PainSc: 0-No pain                 [name] P Kiri Hinderliter

## 2020-10-15 NOTE — Op Note (Signed)
[facility] Patient Name: Julie Cruz Procedure Date: 10/15/2020 MRN: [id number] Attending MD: Jeani Hawking , MD Date of Birth: Nov 07, 1965 CSN: [id number] Age: 55 Admit Type: Outpatient Procedure:                Colonoscopy Indications:              Screening for colorectal malignant neoplasm Providers:                Jeani Hawking, MD, Tillie Fantasia, RN, Michele Mcalpine Technician Referring MD:              Medicines:                Propofol per Anesthesia Complications:            No immediate complications. Estimated Blood Loss:     Estimated blood loss: none. Procedure:                Pre-Anesthesia Assessment:                           - Prior to the procedure, a History and Physical                            was performed, and patient medications and                            allergies were reviewed. The patient's tolerance of                            previous anesthesia was also reviewed. The risks                            and benefits of the procedure and the sedation                            options and risks were discussed with the patient.                            All questions were answered, and informed consent                            was obtained. Prior Anticoagulants: The patient has                            taken no previous anticoagulant or antiplatelet                            agents. ASA Grade Assessment: III - A patient with                            severe systemic disease. After reviewing the risks  and benefits, the patient was deemed in                            satisfactory condition to undergo the procedure.                           - Sedation was administered by an anesthesia                            professional. Deep sedation was attained.                           After obtaining informed consent, the colonoscope                            was passed under  direct vision. Throughout the                            procedure, the patient's blood pressure, pulse, and                            oxygen saturations were monitored continuously. The                            CF-HQ190L ([id number]) Olympus colonoscope was                            introduced through the anus and advanced to the the                            cecum, identified by appendiceal orifice and                            ileocecal valve. The patient tolerated the                            procedure well. The quality of the bowel                            preparation was good. The ileocecal valve,                            appendiceal orifice, and rectum were photographed.                            The colonoscopy was somewhat difficult due to                            significant looping. Successful completion of the                            procedure was aided by using manual pressure and  straightening and shortening the scope to obtain                            bowel loop reduction. Scope In: 7:46:36 AM Scope Out: 8:08:14 AM Scope Withdrawal Time: 0 hours 13 minutes 43 seconds  Total Procedure Duration: 0 hours 21 minutes 38 seconds  Findings:      The entire examined colon appeared normal. Impression:               - The entire examined colon is normal.                           - No specimens collected. Moderate Sedation:      Not Applicable - Patient had care per Anesthesia. Recommendation:           - Patient has a contact number available for                            emergencies. The signs and symptoms of potential                            delayed complications were discussed with the                            patient. Return to normal activities tomorrow.                            Written discharge instructions were provided to the                            patient.                           - Resume previous diet.                            - Continue present medications.                           - Repeat colonoscopy in 10 years for screening                            purposes. Procedure Code(s):        --- Professional ---                           [phone], Colonoscopy, flexible; diagnostic, including                            collection of specimen(s) by brushing or washing,                            when performed (separate procedure) Diagnosis Code(s):        --- Professional ---                           Z12.11, Encounter for screening for malignant  neoplasm of colon CPT copyright 2019 [employer] Association. All rights reserved. The codes documented in this report are preliminary and upon coder review may  be revised to meet current compliance requirements. Jeani Hawking, MD Jeani Hawking, MD 10/15/2020 8:19:49 AM This report has been signed electronically. Number of Addenda: 0

## 2020-10-15 NOTE — Discharge Instructions (Signed)

## 2020-10-16 ENCOUNTER — Telehealth (HOSPITAL_COMMUNITY): Payer: Self-pay | Admitting: Physician Assistant

## 2020-10-16 NOTE — Telephone Encounter (Signed)
Dry cough and wheezing since yesterday's colonoscopy.  Denies pain, nausea pre or post procedure.  Received propofol for sedation.  Is not in severe distress.   She has a pulmonologist  so I  suggested she place call to pulmonary team for advice.

## 2020-10-18 ENCOUNTER — Encounter (HOSPITAL_COMMUNITY): Payer: Self-pay | Admitting: Gastroenterology

## 2020-10-19 ENCOUNTER — Other Ambulatory Visit: Payer: Self-pay

## 2020-10-19 ENCOUNTER — Other Ambulatory Visit: Payer: Medicare HMO

## 2020-10-19 ENCOUNTER — Ambulatory Visit (INDEPENDENT_AMBULATORY_CARE_PROVIDER_SITE_OTHER)
Admission: RE | Admit: 2020-10-19 | Discharge: 2020-10-19 | Disposition: A | Discharge status: Home / Self Care | Payer: Medicare HMO | Source: Ambulatory Visit | Attending: Pulmonary Disease | Admitting: Pulmonary Disease

## 2020-10-19 DIAGNOSIS — D869 Sarcoidosis, unspecified: Secondary | ICD-10-CM | POA: Diagnosis not present

## 2020-10-19 DIAGNOSIS — R0602 Shortness of breath: Secondary | ICD-10-CM

## 2020-10-19 DIAGNOSIS — R6889 Other general symptoms and signs: Secondary | ICD-10-CM | POA: Diagnosis not present

## 2020-10-24 DIAGNOSIS — R2689 Other abnormalities of gait and mobility: Secondary | ICD-10-CM | POA: Diagnosis not present

## 2020-10-24 DIAGNOSIS — J449 Chronic obstructive pulmonary disease, unspecified: Secondary | ICD-10-CM | POA: Diagnosis not present

## 2020-10-25 DIAGNOSIS — G4730 Sleep apnea, unspecified: Secondary | ICD-10-CM | POA: Diagnosis not present

## 2020-10-25 DIAGNOSIS — R635 Abnormal weight gain: Secondary | ICD-10-CM | POA: Diagnosis not present

## 2020-10-25 DIAGNOSIS — D869 Sarcoidosis, unspecified: Secondary | ICD-10-CM | POA: Diagnosis not present

## 2020-10-25 DIAGNOSIS — H409 Unspecified glaucoma: Secondary | ICD-10-CM | POA: Diagnosis not present

## 2020-10-25 DIAGNOSIS — J449 Chronic obstructive pulmonary disease, unspecified: Secondary | ICD-10-CM | POA: Diagnosis not present

## 2020-10-25 DIAGNOSIS — F419 Anxiety disorder, unspecified: Secondary | ICD-10-CM | POA: Diagnosis not present

## 2020-10-25 DIAGNOSIS — M797 Fibromyalgia: Secondary | ICD-10-CM | POA: Diagnosis not present

## 2020-10-25 DIAGNOSIS — Z13220 Encounter for screening for lipoid disorders: Secondary | ICD-10-CM | POA: Diagnosis not present

## 2020-10-25 DIAGNOSIS — M5459 Other low back pain: Secondary | ICD-10-CM | POA: Diagnosis not present

## 2020-10-25 DIAGNOSIS — F329 Major depressive disorder, single episode, unspecified: Secondary | ICD-10-CM | POA: Diagnosis not present

## 2020-10-25 DIAGNOSIS — Z13228 Encounter for screening for other metabolic disorders: Secondary | ICD-10-CM | POA: Diagnosis not present

## 2020-11-10 ENCOUNTER — Telehealth: Payer: Self-pay | Admitting: Cardiology

## 2020-11-10 ENCOUNTER — Telehealth: Payer: Self-pay | Admitting: Pulmonary Disease

## 2020-11-10 DIAGNOSIS — Z95818 Presence of other cardiac implants and grafts: Secondary | ICD-10-CM

## 2020-11-10 NOTE — Telephone Encounter (Signed)
Patient called and was requesting a referral from Dr. Tomie China to one of our EP to monitor her pacemaker transmissions

## 2020-11-10 NOTE — Telephone Encounter (Signed)
Will forward to [facility] to follow up on this.

## 2020-11-10 NOTE — Telephone Encounter (Signed)
Patient calling to get results from CT scan.   Dr. Francine Graven please advise  A separate T/O has been sent to [facility] to follow up on the patients opthalmology referral.

## 2020-11-11 ENCOUNTER — Ambulatory Visit (INDEPENDENT_AMBULATORY_CARE_PROVIDER_SITE_OTHER): Payer: Medicare HMO

## 2020-11-11 DIAGNOSIS — R55 Syncope and collapse: Secondary | ICD-10-CM

## 2020-11-11 LAB — CUP PACEART REMOTE DEVICE CHECK
Date Time Interrogation Session: 20220914232718
Implantable Pulse Generator Implant Date: 20190226

## 2020-11-11 MED ORDER — BUDESONIDE-FORMOTEROL FUMARATE 160-4.5 MCG/ACT IN AERO: 2.0000 | INHALATION_SPRAY | Freq: Two times a day (BID) | RESPIRATORY_TRACT | 6 refills | Status: DC

## 2020-11-11 NOTE — Telephone Encounter (Signed)
I called and spoke with patient regarding Dr. Francine Graven recs on CT. Patient verbalized understanding and wants to try Symbicort. Patient has tried Advair and did not like how it made her feel. She also asked about referral to eye doctor and looks like it went to Dr. Lanna Poche. I informed patient that some offices are behind on referral and she can try to call their office to make an appt. She also asked about referral to weight loss clinic. I did not see a referral in for that so I went ahead and sent one in. I informed patient to call if she did not like symbicort and can try something else. Patient verbalized understanding, nothing further needed.

## 2020-11-11 NOTE — Telephone Encounter (Addendum)
[facility] & spoke to [place].  She states was just given list of patient's to call from supervisor & she wasn't given demographics but she is going to ask for them but asked if I would go ahead and fax to her.  Her fax # is [phone].  She states she will call pt today.  I faxed demographics and insurance cards.  Nothing further needed.

## 2020-11-11 NOTE — Telephone Encounter (Signed)
Patient's CT Chest scan is consistent with her previous findings on chest imaging with enlarged mediastinal lypmh nodes related to her diagnosis of sarcoidosis.   There are also findings of air trapping on the expiration phase of the scan which is indicative of small airways disease such as asthma.   Please send in prescription for ICS/LABA inhaler such as symbicort 160-4.59mcg 2 puffs twice daily, dulera 200-1mcg 2 puffs twice daily, adivair 230-23mcg 2 puffs twice daily or Breo ellipta 1 puff daily. Whichever is covered by her insurance. We will monitor for any improvement in her breathing symptoms.  Thanks, Melody Comas, MD [facility] Office: [phone]   See Amion for personal pager PCCM on call pager [phone] until 7pm. Please call Elink 7p-7a. [phone]

## 2020-11-15 NOTE — Telephone Encounter (Signed)
Spoke to the patient just now and let her know Dr. Krasowski's recommendations. She verbalizes understanding and thanks me for the call back.  

## 2020-11-16 ENCOUNTER — Ambulatory Visit: Payer: Medicare HMO | Admitting: Pulmonary Disease

## 2020-11-18 NOTE — Progress Notes (Signed)
Carelink Summary Report / Loop Recorder 

## 2020-11-24 DIAGNOSIS — R2689 Other abnormalities of gait and mobility: Secondary | ICD-10-CM | POA: Diagnosis not present

## 2020-11-24 DIAGNOSIS — J449 Chronic obstructive pulmonary disease, unspecified: Secondary | ICD-10-CM | POA: Diagnosis not present

## 2020-12-13 ENCOUNTER — Ambulatory Visit (INDEPENDENT_AMBULATORY_CARE_PROVIDER_SITE_OTHER): Payer: Medicare HMO

## 2020-12-13 DIAGNOSIS — R55 Syncope and collapse: Secondary | ICD-10-CM | POA: Diagnosis not present

## 2020-12-15 ENCOUNTER — Encounter: Payer: Self-pay | Admitting: Pulmonary Disease

## 2020-12-15 ENCOUNTER — Ambulatory Visit (INDEPENDENT_AMBULATORY_CARE_PROVIDER_SITE_OTHER): Payer: Medicare HMO | Admitting: Pulmonary Disease

## 2020-12-15 ENCOUNTER — Other Ambulatory Visit: Payer: Self-pay

## 2020-12-15 VITALS — BP 110/96 | HR 92 | Temp 98.3°F | Ht 70.0 in | Wt 365.2 lb

## 2020-12-15 DIAGNOSIS — D869 Sarcoidosis, unspecified: Secondary | ICD-10-CM | POA: Diagnosis not present

## 2020-12-15 DIAGNOSIS — G4733 Obstructive sleep apnea (adult) (pediatric): Secondary | ICD-10-CM

## 2020-12-15 DIAGNOSIS — Z23 Encounter for immunization: Secondary | ICD-10-CM

## 2020-12-15 DIAGNOSIS — R6889 Other general symptoms and signs: Secondary | ICD-10-CM | POA: Diagnosis not present

## 2020-12-15 LAB — PULMONARY FUNCTION TEST
DL/VA % pred: 128 %
DL/VA: 5.23 ml/min/mmHg/L
DLCO unc % pred: 81 %
DLCO unc: 21.12 ml/min/mmHg
FEF 25-75 Post: 1.07 L/sec
FEF 25-75 Pre: 1.25 L/sec
FEF2575-%Change-Post: -13 %
FEF2575-%Pred-Post: 37 %
FEF2575-%Pred-Pre: 43 %
FEV1-%Change-Post: -4 %
FEV1-%Pred-Post: 47 %
FEV1-%Pred-Pre: 49 %
FEV1-Post: 1.39 L
FEV1-Pre: 1.45 L
FEV1FVC-%Change-Post: 4 %
FEV1FVC-%Pred-Pre: 97 %
FEV6-%Change-Post: -12 %
FEV6-%Pred-Post: 44 %
FEV6-%Pred-Pre: 51 %
FEV6-Post: 1.6 L
FEV6-Pre: 1.83 L
FEV6FVC-%Change-Post: -4 %
FEV6FVC-%Pred-Post: 98 %
FEV6FVC-%Pred-Pre: 102 %
FVC-%Change-Post: -8 %
FVC-%Pred-Post: 45 %
FVC-%Pred-Pre: 50 %
FVC-Post: 1.68 L
FVC-Pre: 1.83 L
Post FEV1/FVC ratio: 83 %
Post FEV6/FVC ratio: 96 %
Pre FEV1/FVC ratio: 79 %
Pre FEV6/FVC Ratio: 100 %
RV % pred: 92 %
RV: 2.04 L
TLC % pred: 76 %
TLC: 4.68 L

## 2020-12-15 LAB — CUP PACEART REMOTE DEVICE CHECK
Date Time Interrogation Session: 20221017232855
Implantable Pulse Generator Implant Date: 20190226

## 2020-12-15 NOTE — Progress Notes (Signed)
PFT done today. 

## 2020-12-15 NOTE — Patient Instructions (Signed)
We will give you the pneumonia shot today  Schedule for in lab split-night study for obstructive sleep apnea  Record release for previous sleep study  I will see you in 2 to 3 months  You may reinitiate your machine use  Call with significant concerns  Try and develop a regular routine to your sleep habits

## 2020-12-15 NOTE — Progress Notes (Signed)
Julie Cruz    [id number]    1965/09/05  Primary Care Physician:Reese, Zadie Cleverly, MD  Referring Physician: Lin Landsman, [place],  [place] [id number]  Chief complaint:   Patient being seen for obstructive sleep apnea  HPI:  Diagnosis obstructive sleep apnea-likely severe a few years back She stopped using CPAP with the recall  She does have daytime sleepiness Snoring, dryness of her mouth in the morning  Sleep habit is poor Very variable sleep time goes to bed between 1 AM and 5 AM Usually requires trazodone to help her sleep Few awakenings Very variable waking up time  Admits to dryness of the mouth in the mornings Admits to occasional headaches Occasional night sweats Memory is poor  Both parents snored  She was very compliant with CPAP use and was benefiting from CPAP use Was not using any special cleaners, was not using so clean or any other devices Soapy water and hangs it out to dry   History of sarcoidosis, history of pulmonary hypertension History of fibromyalgia Reformed smoker  Had biopsy done in 2007 Was treated with methotrexate in the past  Has gained about 20 to 30 pounds  History of bipolar disorder  Outpatient Encounter Medications as of 12/15/2020  Medication Sig   acetaminophen (TYLENOL) 650 MG CR tablet Take 650 mg by mouth every 8 (eight) hours as needed for pain.   brimonidine (ALPHAGAN P) 0.1 % SOLN Place 1 drop into both eyes in the morning and at bedtime.   clonazePAM (KLONOPIN) 0.5 MG tablet Take 0.5 mg by mouth daily.   cyclobenzaprine (FLEXERIL) 10 MG tablet Take 10-20 mg by mouth 3 (three) times daily as needed for muscle spasms.   hydrochlorothiazide (HYDRODIURIL) 50 MG tablet Take 50 mg by mouth in the morning.   ipratropium-albuterol (DUONEB) 0.5-2.5 (3) MG/3ML SOLN Take 3 mLs by nebulization every 6 (six) hours as needed.   lisinopril (ZESTRIL) 40 MG tablet Take 40 mg by mouth in the morning.    Menthol (ICY HOT) 5 % PTCH Place 1 patch onto the skin daily as needed (pain.).   Menthol, Topical Analgesic, (ICY HOT EX) Apply 1 application topically 3 (three) times daily as needed (pain.).   methocarbamol (ROBAXIN) 750 MG tablet Take 750 mg by mouth every 8 (eight) hours as needed for muscle spasms.   naproxen (NAPROSYN) 500 MG tablet Take 1,000 mg by mouth daily as needed (pain.).   pantoprazole (PROTONIX) 40 MG tablet Take 40 mg by mouth in the morning.   potassium chloride (KLOR-CON) 10 MEQ tablet Take 20 mEq by mouth in the morning.   prednisoLONE acetate (PRED FORTE) 1 % ophthalmic suspension Place 1 drop into both eyes See admin instructions. Instill 1 drop into right eye scheduled twice daily for inflammation/pain & instil 1 drop into left eye up to twice daily if needed for redness/inflammation/pain.   sertraline (ZOLOFT) 100 MG tablet Take 200 mg by mouth in the morning.   traZODone (DESYREL) 50 MG tablet Take 50 mg by mouth at bedtime.   verapamil (VERELAN PM) 360 MG 24 hr capsule Take 360 mg by mouth in the morning.   albuterol (VENTOLIN HFA) 108 (90 Base) MCG/ACT inhaler Inhale 2 puffs into the lungs every 6 (six) hours as needed for wheezing or shortness of breath. (Patient taking differently: Inhale 2 puffs into the lungs See admin instructions. Inhale 2 puffs scheduled twice daily & inhale every 6 hours if  needed for wheezing/shortness of breath)   aspirin 325 MG tablet Take 325 mg by mouth daily as needed (pain/chest tightness). (Patient not taking: Reported on 12/15/2020)   budesonide-formoterol (SYMBICORT) 160-4.5 MCG/ACT inhaler Inhale 2 puffs into the lungs in the morning and at bedtime. (Patient not taking: Reported on 12/15/2020)   ketorolac (TORADOL) 10 MG tablet    SUPREP BOWEL PREP KIT 17.5-3.13-1.6 GM/177ML SOLN Take 354 mLs by mouth as directed. (Patient not taking: Reported on 12/15/2020)   No facility-administered encounter medications on file as of 12/15/2020.     Allergies as of 12/15/2020   (No Known Allergies)    Past Medical History:  Diagnosis Date   Acute medial meniscus tear    Anxiety    Bipolar 1 disorder (HCC)    Depression    Fibromyalgia    H/O spinal cord injury    T, L ans CSpine   Hypertension    Menopause    Pulmonary hypertension ([place])    Sarcoid, cardiac     Past Surgical History:  Procedure Laterality Date   C Section     Catherization      x2   COLONOSCOPY WITH PROPOFOL N/A 10/15/2020   Procedure: COLONOSCOPY WITH PROPOFOL;  Surgeon: Carol Ada, MD;  Location: [facility];  Service: Endoscopy;  Laterality: N/A;   Etopic  pregnacy     LUNG BIOPSY     TUBAL LIGATION      Family History  Problem Relation Age of Onset   Hyperlipidemia Mother    Asthma Mother    Thyroid disease Mother    Depression Mother    Rheum arthritis Mother    Cancer Father    Heart attack Brother    Depression Brother    HIV/AIDS Brother     Social History   Socioeconomic History   Marital status: Single    Spouse name: Not on file   Number of children: Not on file   Years of education: Not on file   Highest education level: Not on file  Occupational History   Not on file  Tobacco Use   Smoking status: Former    Types: Cigarettes   Smokeless tobacco: Never  Vaping Use   Vaping Use: Never used  Substance and Sexual Activity   Alcohol use: Yes    Alcohol/week: 2.0 standard drinks    Types: 2 Glasses of wine per week    Comment: Daily   Drug use: Never   Sexual activity: Not Currently  Other Topics Concern   Not on file  Social History Narrative   Not on file   Social Determinants of Health   Financial Resource Strain: Not on file  Food Insecurity: Not on file  Transportation Needs: Not on file  Physical Activity: Not on file  Stress: Not on file  Social Connections: Not on file  Intimate Partner Violence: Not on file    Review of Systems  Constitutional:  Positive for fatigue.  Respiratory:   Positive for apnea.   Psychiatric/Behavioral:  Positive for sleep disturbance.    Vitals:   12/15/20 1613  BP: (!) 110/96  Pulse: 92  Temp: 98.3 F (36.8 C)  SpO2: 95%     Physical Exam Constitutional:      Appearance: She is obese.  HENT:     Head: Normocephalic and atraumatic.     Nose: No congestion.     Mouth/Throat:     Mouth: Mucous membranes are moist.     Comments: Mallampati  4, crowded oropharynx, macroglossia Eyes:     Pupils: Pupils are equal, round, and reactive to light.  Cardiovascular:     Rate and Rhythm: Normal rate and regular rhythm.     Heart sounds: No murmur heard.   No friction rub.  Pulmonary:     Effort: No respiratory distress.     Breath sounds: No stridor. No wheezing or rhonchi.  Musculoskeletal:     Cervical back: No rigidity or tenderness.  Neurological:     Mental Status: She is alert.  Psychiatric:        Mood and Affect: Mood normal.   No flowsheet data found.   Data Reviewed: Previous study not available  Assessment:  History of obstructive sleep apnea -Not currently on CPAP therapy  I did discuss resuming CPAP therapy with her current device -She feels it was not well-tolerated the last few times she tried to use it  She will be scheduled for a split-night study to assess severity of sleep disordered breathing as she has gained significant weight since her last study  Importance of better sleep routine discussed  History of sarcoidosis  History of pulmonary hypertension  Plan/Recommendations: Schedule patient for an in lab split-night study  Graded exercise as tolerated  Encouraged weight loss efforts  Tentative follow-up in about 3 months  We will try and obtain some records regarding previous study  20 valent pneumovax administration today   Sherrilyn Rist MD [facility] 12/15/2020, 4:38 PM  CC: Lin Landsman, MD

## 2020-12-22 NOTE — Progress Notes (Signed)
Carelink Summary Report / Loop Recorder 

## 2020-12-24 ENCOUNTER — Telehealth: Payer: Self-pay

## 2020-12-24 DIAGNOSIS — R2689 Other abnormalities of gait and mobility: Secondary | ICD-10-CM | POA: Diagnosis not present

## 2020-12-24 DIAGNOSIS — J449 Chronic obstructive pulmonary disease, unspecified: Secondary | ICD-10-CM | POA: Diagnosis not present

## 2020-12-24 NOTE — Telephone Encounter (Signed)
Linq alert received, ILR has reached RRT.    Spoke with pt, she has an appt on 01/11/21 to determine if new ILR is needed.    Per notes, 01/11/21 is a new patient consult, it is not clear that ILR replacement is going be discussed.  Will forward to Dr. Elberta Fortis to advise.    It appears ILR placed 04/24/17 for palpitations and syncope.

## 2020-12-30 DIAGNOSIS — R6889 Other general symptoms and signs: Secondary | ICD-10-CM | POA: Diagnosis not present

## 2021-01-04 ENCOUNTER — Ambulatory Visit: Payer: Medicare HMO | Admitting: Student

## 2021-01-11 ENCOUNTER — Encounter: Payer: Self-pay | Admitting: Cardiology

## 2021-01-11 ENCOUNTER — Ambulatory Visit: Payer: Medicare HMO | Admitting: Cardiology

## 2021-01-11 ENCOUNTER — Other Ambulatory Visit: Payer: Self-pay

## 2021-01-11 VITALS — BP 148/80 | HR 78 | Resp 18 | Ht 71.0 in | Wt 370.0 lb

## 2021-01-11 DIAGNOSIS — R55 Syncope and collapse: Secondary | ICD-10-CM | POA: Diagnosis not present

## 2021-01-11 DIAGNOSIS — R6889 Other general symptoms and signs: Secondary | ICD-10-CM | POA: Diagnosis not present

## 2021-01-11 DIAGNOSIS — R0602 Shortness of breath: Secondary | ICD-10-CM

## 2021-01-11 LAB — TROPONIN T: Troponin T (Highly Sensitive): 8 ng/L (ref 0–14)

## 2021-01-11 NOTE — Patient Instructions (Addendum)
Medication Instructions:  Your physician recommends that you continue on your current medications as directed. Please refer to the Current Medication list given to you today.  *If you need a refill on your cardiac medications before your next appointment, please call your pharmacy*   Lab Work: Today: BNP, BMET & troponin If you have labs (blood work) drawn today and your tests are completely normal, you will receive your results only by: MyChart Message (if you have MyChart) OR A paper copy in the mail If you have any lab test that is abnormal or we need to change your treatment, we will call you to review the results.   Testing/Procedures: None ordered   Follow-Up: At [facility], you and your health needs are our priority.  As part of our continuing mission to provide you with exceptional heart care, we have created designated Provider Care Teams.  These Care Teams include your primary Cardiologist (physician) and Advanced Practice Providers (APPs -  Physician Assistants and Nurse Practitioners) who all work together to provide you with the care you need, when you need it.   Your next appointment:   To be  determined after have reviewed records from [facility]  The format for your next appointment:   In Person  Provider:   Loman Brooklyn, MD  You have been referred to medical weight loss management   Thank you for choosing [facility]!!   Dory Horn, RN [phone]   Other Instructions

## 2021-01-11 NOTE — Progress Notes (Signed)
Electrophysiology Office Note   Date:  01/11/2021   ID:  Julie Julie, DOB 1965/04/01, MRN [id number]  PCP:  Lin Landsman, MD  Cardiologist:  Agustin Cree Primary Electrophysiologist:  Daisia Slomski Meredith Leeds, MD    Chief Complaint: Julie Julie monitor   History of Present Illness: Julie Julie is a 55 y.o. female who is being seen today for the evaluation of palpitations at the request of Park Liter, MD. Presenting today for electrophysiology evaluation.  She has a history of sarcoidosis with cardiac involvement, hypertension, pulmonary hypertension, bipolar 1.  She relocated from [facility].  Her cardiologist was at [facility].  She had a PET scan in 2020 that showed no cardiac activity.  She had a heart catheterization in 2019 with no obstructive disease.  She had a Linq monitor implanted due to an episode of syncope.  She presents today with a myriad of complaints.  Approximately 1 week ago, she developed significant shortness of breath and pain in her left chest.  She states that taking an extra verapamil, lisinopril, and drinking a glass of red wine would improve this.  She also stated that she had quite a bit of anxiety around that time.  Her main issue at that time was shortness of breath.  She also has a Linq monitor implanted.  The device is reached RRT.  She would potentially like it explanted and is asking for another device to be implanted.  Unfortunately, we do not have much information on the indication for the device.   Past Medical History:  Diagnosis Date   Acute medial meniscus tear    Anxiety    Bipolar 1 disorder (HCC)    Depression    Fibromyalgia    H/O spinal cord injury    T, L ans CSpine   Hypertension    Menopause    Pulmonary hypertension (HCC)    Sarcoid, cardiac    Past Surgical History:  Procedure Laterality Date   C Section     Catherization      x2   COLONOSCOPY WITH PROPOFOL N/A 10/15/2020   Procedure: COLONOSCOPY WITH PROPOFOL;   Surgeon: Carol Ada, MD;  Location: [facility];  Service: Endoscopy;  Laterality: N/A;   Etopic  pregnacy     LUNG BIOPSY     TUBAL LIGATION       Current Outpatient Medications  Medication Sig Dispense Refill   acetaminophen (TYLENOL) 650 MG CR tablet Take 650 mg by mouth every 8 (eight) hours as needed for pain.     albuterol (VENTOLIN HFA) 108 (90 Base) MCG/ACT inhaler Inhale 2 puffs into the lungs every 6 (six) hours as needed for wheezing or shortness of breath. (Patient taking differently: Inhale 2 puffs into the lungs See admin instructions. Inhale 2 puffs scheduled twice daily & inhale every 6 hours if needed for wheezing/shortness of breath) 18 g 3   aspirin 325 MG tablet Take 325 mg by mouth daily as needed (pain/chest tightness).     brimonidine (ALPHAGAN P) 0.1 % SOLN Place 1 drop into both eyes in the morning and at bedtime.     budesonide-formoterol (SYMBICORT) 160-4.5 MCG/ACT inhaler Inhale 2 puffs into the lungs in the morning and at bedtime. 1 each 6   clonazePAM (KLONOPIN) 0.5 MG tablet Take 0.5 mg by mouth daily.     cyclobenzaprine (FLEXERIL) 10 MG tablet Take 10-20 mg by mouth 3 (three) times daily as needed for muscle spasms.     hydrochlorothiazide (HYDRODIURIL)  50 MG tablet Take 50 mg by mouth in the morning.     ipratropium-albuterol (DUONEB) 0.5-2.5 (3) MG/3ML SOLN Take 3 mLs by nebulization every 6 (six) hours as needed. 360 mL 6   ketorolac (TORADOL) 10 MG tablet      lisinopril (ZESTRIL) 40 MG tablet Take 40 mg by mouth in the morning.     Menthol (ICY HOT) 5 % PTCH Place 1 patch onto the skin daily as needed (pain.).     Menthol, Topical Analgesic, (ICY HOT EX) Apply 1 application topically 3 (three) times daily as needed (pain.).     methocarbamol (ROBAXIN) 750 MG tablet Take 750 mg by mouth every 8 (eight) hours as needed for muscle spasms. PRN     naproxen (NAPROSYN) 500 MG tablet Take 1,000 mg by mouth daily as needed (pain.).     pantoprazole (PROTONIX)  40 MG tablet Take 40 mg by mouth in the morning.     potassium chloride (KLOR-CON) 10 MEQ tablet Take 20 mEq by mouth in the morning.     prednisoLONE acetate (PRED FORTE) 1 % ophthalmic suspension Place 1 drop into both eyes See admin instructions. Instill 1 drop into right eye scheduled twice daily for inflammation/pain & instil 1 drop into left eye up to twice daily if needed for redness/inflammation/pain.     sertraline (ZOLOFT) 100 MG tablet Take 200 mg by mouth in the morning.     SUPREP BOWEL PREP KIT 17.5-3.13-1.6 GM/177ML SOLN Take 354 mLs by mouth as directed.     traZODone (DESYREL) 50 MG tablet Take 50 mg by mouth at bedtime.     verapamil (VERELAN PM) 360 MG 24 hr capsule Take 360 mg by mouth in the morning.     No current facility-administered medications for this visit.    Allergies:   Patient has no known allergies.   Social History:  The patient  reports that she has quit smoking. Her smoking use included cigarettes. She has never used smokeless tobacco. She reports current alcohol use of about 2.0 standard drinks per week. She reports that she does not use drugs.   Family History:  The patient's family history includes Asthma in her mother; Cancer in her father; Depression in her brother and mother; HIV/AIDS in her brother; Heart attack in her brother; Hyperlipidemia in her mother; Rheum arthritis in her mother; Thyroid disease in her mother.    ROS:  Please see the history of present illness.   Otherwise, review of systems is positive for none.   All other systems are reviewed and negative.    PHYSICAL EXAM: VS:  BP (!) 148/80   Pulse 78   Resp 18   Ht $R'5\' 11"'iv$  (1.803 m)   Wt (!) 370 lb (167.8 kg)   SpO2 96%   BMI 51.60 kg/m  , BMI Body mass index is 51.6 kg/m. GEN: Well nourished, well developed, in no acute distress  HEENT: normal  Neck: no JVD, carotid bruits, or masses Cardiac: RRR; no murmurs, rubs, or gallops,no edema  Respiratory:  clear to auscultation  bilaterally, normal work of breathing GI: soft, nontender, nondistended, + BS MS: no deformity or atrophy  Skin: warm and dry, device pocket is well healed Neuro:  Strength and sensation are intact Psych: euthymic mood, full affect  EKG:  EKG is ordered today. Personal review of the ekg ordered shows sinus rhythm, rate 78  Device interrogation is reviewed today in detail.  See PaceArt for details.   Recent  Labs: No results found for requested labs within last 8760 hours.    Lipid Panel  No results found for: CHOL, TRIG, HDL, CHOLHDL, VLDL, LDLCALC, LDLDIRECT   Wt Readings from Last 3 Encounters:  01/11/21 (!) 370 lb (167.8 kg)  12/15/20 (!) 365 lb 3.2 oz (165.7 kg)  10/12/20 (!) 358 lb (162.4 kg)      Other studies Reviewed: Additional studies/ records that were reviewed today include: TTE 09/28/20  Review of the above records today demonstrates:   1. Left ventricular ejection fraction, by estimation, is 70 to 75%. The  left ventricle has hyperdynamic function. The left ventricle has no  regional wall motion abnormalities. There is mild left ventricular  hypertrophy. Left ventricular diastolic  parameters are consistent with Grade I diastolic dysfunction (impaired  relaxation). There is midcavity systolic obliteration.   2. Right ventricular systolic function is normal. The right ventricular  size is normal.   3. Left atrial size was mildly dilated.   4. The mitral valve is normal in structure. No evidence of mitral valve  regurgitation. No evidence of mitral stenosis.   5. The aortic valve is grossly normal. Aortic valve regurgitation is not  visualized. No aortic stenosis is present.   6. Aortic dilatation noted. There is borderline dilatation of the aortic  root, measuring 39 mm.   7. The inferior vena cava is normal in size with greater than 50%  respiratory variability, suggesting right atrial pressure of 3 mmHg.    ASSESSMENT AND PLAN:  1.  Syncope: Status post  [employer] Linq monitor.  Battery has been at RRT since October 2022.  We Consandra Laske work on getting records from [facility] as to the indication of the device implant.  Patient is wanting potentially a reimplantation, though I am unsure if that is completely necessary.  We Ronnae Kaser wait for her further information prior to making a decision.  2.  Sarcoidosis: She has follow-up scheduled with pulmonary medicine.  3.  Morbid obesity: Patient is trying to lose weight.  Diet and exercise encouraged  4.  Obstructive sleep apnea: CPAP compliance.  5.  Shortness of breath: Likely multifactorial.  It is unclear to me as a cause of her shortness of breath.  Anzal Bartnick order a BMP, BNP, and troponin to rule out coronary issues.   Case discussed with primary cardiology  Current medicines are reviewed at length with the patient today.   The patient does not have concerns regarding her medicines.  The following changes were made today:  none  Labs/ tests ordered today include:  Orders Placed This Encounter  Procedures   Pro b natriuretic peptide (BNP)   Basic metabolic panel   Troponin T   Amb Ref to Medical Weight Management   EKG 12-Lead     Disposition:   FU with Jasmeen Fritsch 6 months  Signed, Garnetta Fedrick Meredith Leeds, MD  01/11/2021 12:02 PM     [place] [address] [phone] (office) [phone] (fax)

## 2021-01-12 LAB — BASIC METABOLIC PANEL
BUN/Creatinine Ratio: 16 (ref 9–23)
BUN: 15 mg/dL (ref 6–24)
CO2: 28 mmol/L (ref 20–29)
Calcium: 9.3 mg/dL (ref 8.7–10.2)
Chloride: 99 mmol/L (ref 96–106)
Creatinine, Ser: 0.96 mg/dL (ref 0.57–1.00)
Glucose: 103 mg/dL — ABNORMAL HIGH (ref 70–99)
Potassium: 4 mmol/L (ref 3.5–5.2)
Sodium: 141 mmol/L (ref 134–144)
eGFR: 71 mL/min/{1.73_m2} (ref >59)

## 2021-01-12 LAB — PRO B NATRIURETIC PEPTIDE: NT-Pro BNP: 26 pg/mL (ref 0–249)

## 2021-01-13 ENCOUNTER — Ambulatory Visit (INDEPENDENT_AMBULATORY_CARE_PROVIDER_SITE_OTHER): Payer: Medicare HMO

## 2021-01-13 DIAGNOSIS — R55 Syncope and collapse: Secondary | ICD-10-CM

## 2021-01-17 LAB — CUP PACEART REMOTE DEVICE CHECK
Date Time Interrogation Session: 20221119223205
Implantable Pulse Generator Implant Date: 20190226

## 2021-01-18 ENCOUNTER — Telehealth: Payer: Self-pay | Admitting: Cardiology

## 2021-01-18 NOTE — Telephone Encounter (Signed)
Returned pt call.  Pt informed of lab result. Informed that I have not received any records from previous EP physician. She will reach out to their office and thanks me for return call

## 2021-01-18 NOTE — Telephone Encounter (Signed)
Pt is following up on the Lab work from 12/11/20   Pt's arm is still purple from when she had the blood work.   Pt is following up on Medical Release paperwork to [facility]. [address] Dr. Bethann Punches

## 2021-01-24 NOTE — Progress Notes (Signed)
Carelink Summary Report / Loop Recorder 

## 2021-02-01 ENCOUNTER — Encounter (HOSPITAL_BASED_OUTPATIENT_CLINIC_OR_DEPARTMENT_OTHER): Payer: Medicare HMO | Admitting: Pulmonary Disease

## 2021-02-08 DIAGNOSIS — J069 Acute upper respiratory infection, unspecified: Secondary | ICD-10-CM | POA: Diagnosis not present

## 2021-02-08 DIAGNOSIS — D869 Sarcoidosis, unspecified: Secondary | ICD-10-CM | POA: Diagnosis not present

## 2021-02-08 DIAGNOSIS — Z20828 Contact with and (suspected) exposure to other viral communicable diseases: Secondary | ICD-10-CM | POA: Diagnosis not present

## 2021-02-08 DIAGNOSIS — R6889 Other general symptoms and signs: Secondary | ICD-10-CM | POA: Diagnosis not present

## 2021-02-08 DIAGNOSIS — J449 Chronic obstructive pulmonary disease, unspecified: Secondary | ICD-10-CM | POA: Diagnosis not present

## 2021-02-08 DIAGNOSIS — R635 Abnormal weight gain: Secondary | ICD-10-CM | POA: Diagnosis not present

## 2021-02-08 DIAGNOSIS — G4730 Sleep apnea, unspecified: Secondary | ICD-10-CM | POA: Diagnosis not present

## 2021-02-16 ENCOUNTER — Telehealth: Payer: Self-pay | Admitting: Cardiology

## 2021-02-16 ENCOUNTER — Telehealth: Payer: Self-pay | Admitting: Pulmonary Disease

## 2021-02-16 DIAGNOSIS — U071 COVID-19: Secondary | ICD-10-CM

## 2021-02-16 MED ORDER — PREDNISONE 10 MG PO TABS: ORAL_TABLET | ORAL | 0 refills | Status: AC

## 2021-02-16 NOTE — Telephone Encounter (Signed)
Spoke to the patient just now and let her know that she needs to contact her PCP in regards to COVID-19/Congestion. She is unhappy as she says her PCP cant do a chest x-ray in the office. I advised that this still needed to be addressed with them. I advised that she could also go to urgent care for the x-ray if she would like. She verbalizes understanding.

## 2021-02-16 NOTE — Telephone Encounter (Signed)
Called and spoke with pt who states she has had a cough since 12/7 when she later found out that she had covid. Pt said she was tested for covid 12/13 and the results came back positive 12/15. Pt said that she has had a cough ever since.  Asked pt if she had tried any OTC meds and pt said that she has been doing a lot of home remedies to see if it would help with her cough but said due to her chronic sarcoid and all the meds she is on, she does not know of any OTC meds that she can take.  Pt said that she has been using  her symbicort inhaler as prescribed, has had to use her rescue inhaler at least 3 times a day. Pt has not had to use her nebulizer any yet but said she was going to use it tonight to see if it would help.  Asked pt if she has had a temp and she said that she has not actually checked her temp does not feel like she has one but said that she has been sweating a lot recently.  Along with pt's cough which is causing a burning sensation in her lungs, she said that she also has had worsening back pain.  Pt wants to know what could be recommended and if she might need to get either a cxr or have a CT performed. Dr. Francine Graven, please advise.

## 2021-02-16 NOTE — Addendum Note (Signed)
Addended by: Melody Comas on: 02/16/2021 04:40 PM   Modules accepted: Orders

## 2021-02-16 NOTE — Telephone Encounter (Signed)
Called and spoke with pt letting her know the recs per JD. While speaking with pt about the prednisone taper that had been sent in, she started questioning why the pred taper had been sent in as she said her MD in [place] used to prescribe methotrexate.  Stated to her due to her having the cough and other symptoms since she had covid is why JD prescribed the pred taper. Stated to her this taper is not something that she would be on long-term as when she got to the last pill, she would be done with the Rx. Stated to her if she wanted to discuss methotrexate with JD at an OV with him that she could and she verbalized understanding. Pt wanted to go ahead and have an OV scheduled so have scheduled pt an OV with JD 12/29. Nothing further needed.

## 2021-02-16 NOTE — Telephone Encounter (Signed)
Patient states she was recently diagnosed with COVID on 12/15.  She has really bad congestion, she feels it all in her chest. She would like to be seen next week, but I couldn't find anything until March, she wants to be seen before then.

## 2021-02-16 NOTE — Telephone Encounter (Signed)
Sent in steroid taper  40mg  x 3 days 30mg  x 3 days 20mg  x 3 days 10mg  x 3 days  Thanks, JD

## 2021-02-24 ENCOUNTER — Encounter: Payer: Self-pay | Admitting: Pulmonary Disease

## 2021-02-24 ENCOUNTER — Ambulatory Visit (INDEPENDENT_AMBULATORY_CARE_PROVIDER_SITE_OTHER): Payer: Medicare HMO | Admitting: Pulmonary Disease

## 2021-02-24 ENCOUNTER — Other Ambulatory Visit: Payer: Self-pay

## 2021-02-24 DIAGNOSIS — R6889 Other general symptoms and signs: Secondary | ICD-10-CM | POA: Diagnosis not present

## 2021-02-24 NOTE — Progress Notes (Signed)
f  Synopsis: Referred in August 2022 for Sarcoidosis   Subjective:   PATIENT ID: Julie Cruz GENDER: female DOB: [birth year], MRN: [id number]  HPI  Chief Complaint  Patient presents with   Follow-up    F/U after having COVID about 2 weeks ago. Has had a productive cough ever since.    Julie Cruz is a 55 year old woman, former smoker with hypertension, pulmonary hypertension, fibromyalgia and sarcoidosis who returns to pulmonary clinic for follow up of sarcoidosis.  She tested positive for covid 19 on 12/13 and was placed on 12 day prednisone taper on 12/21 for her symptoms of cough and dyspnea. She is much improved at this time. She is using albuterol as needed for her wheezing or dyspnea and using it on a daily basis. She has not started the Symbicort inhaler that was sent in for her after the HRCT Chest was performed showing small airways disease.   PFTs from 10/22 show a restrictive defect with normal diffusion capacity. She is scheduled for split night sleep study 03/15/20. She has ophthalmology visit next week.   She brought in papers from [place]. [place] where she was prescribed methotrexate for sarcoidosis, uveitis and polyarthropathy. She was seen by ophthalmology and rheumatology.   OV 10/06/20 She reports being diagnosed with sarcoidosis at age 55 via lung biopsy in 2007 at [facility] in [place].  She was initially on prednisone for treatment but then due to side effects has stopped this therapy and has been weary of using methotrexate so she has not required systemic therapy over the last few years.  She denies progressive dyspnea since deciding not to have systemic treatment of her sarcoidosis. She is currently using albuterol twice daily for shortness of breath.  She was previously on Advair but did not like this inhaler.   She has history of obstructive sleep apnea and has a CPAP machine but has not used it in months as it is one of the machines included in the  recent recall.  She has gained 20 to 40 pounds over the last couple of years.  Past Medical History:  Diagnosis Date   Acute medial meniscus tear    Anxiety    Bipolar 1 disorder (HCC)    Depression    Fibromyalgia    H/O spinal cord injury    T, L ans CSpine   Hypertension    Menopause    Pulmonary hypertension (HCC)    Sarcoid, cardiac      Family History  Problem Relation Age of Onset   Hyperlipidemia Mother    Asthma Mother    Thyroid disease Mother    Depression Mother    Rheum arthritis Mother    Cancer Father    Heart attack Brother    Depression Brother    HIV/AIDS Brother      Social History   Socioeconomic History   Marital status: Single    Spouse name: Not on file   Number of children: Not on file   Years of education: Not on file   Highest education level: Not on file  Occupational History   Not on file  Tobacco Use   Smoking status: Former    Types: Cigarettes   Smokeless tobacco: Never  Vaping Use   Vaping Use: Never used  Substance and Sexual Activity   Alcohol use: Yes    Alcohol/week: 2.0 standard drinks    Types: 2 Glasses of wine per week    Comment: Daily  Drug use: Never   Sexual activity: Not Currently  Other Topics Concern   Not on file  Social History Narrative   Not on file   Social Determinants of Health   Financial Resource Strain: Not on file  Food Insecurity: Not on file  Transportation Needs: Not on file  Physical Activity: Not on file  Stress: Not on file  Social Connections: Not on file  Intimate Partner Violence: Not on file     No Known Allergies   Outpatient Medications Prior to Visit  Medication Sig Dispense Refill   acetaminophen (TYLENOL) 650 MG CR tablet Take 650 mg by mouth every 8 (eight) hours as needed for pain.     aspirin 325 MG tablet Take 325 mg by mouth daily as needed (pain/chest tightness).     brimonidine (ALPHAGAN P) 0.1 % SOLN Place 1 drop into both eyes in the morning and at bedtime.      clonazePAM (KLONOPIN) 0.5 MG tablet Take 0.5 mg by mouth daily.     cyclobenzaprine (FLEXERIL) 10 MG tablet Take 10-20 mg by mouth 3 (three) times daily as needed for muscle spasms.     hydrochlorothiazide (HYDRODIURIL) 50 MG tablet Take 50 mg by mouth in the morning.     ipratropium-albuterol (DUONEB) 0.5-2.5 (3) MG/3ML SOLN Take 3 mLs by nebulization every 6 (six) hours as needed. 360 mL 6   ketorolac (TORADOL) 10 MG tablet      lisinopril (ZESTRIL) 40 MG tablet Take 40 mg by mouth in the morning.     Menthol (ICY HOT) 5 % PTCH Place 1 patch onto the skin daily as needed (pain.).     Menthol, Topical Analgesic, (ICY HOT EX) Apply 1 application topically 3 (three) times daily as needed (pain.).     methocarbamol (ROBAXIN) 750 MG tablet Take 750 mg by mouth every 8 (eight) hours as needed for muscle spasms. PRN     naproxen (NAPROSYN) 500 MG tablet Take 1,000 mg by mouth daily as needed (pain.).     pantoprazole (PROTONIX) 40 MG tablet Take 40 mg by mouth in the morning.     potassium chloride (KLOR-CON) 10 MEQ tablet Take 20 mEq by mouth in the morning.     prednisoLONE acetate (PRED FORTE) 1 % ophthalmic suspension Place 1 drop into both eyes See admin instructions. Instill 1 drop into right eye scheduled twice daily for inflammation/pain & instil 1 drop into left eye up to twice daily if needed for redness/inflammation/pain.     predniSONE (DELTASONE) 10 MG tablet Take 4 tablets (40 mg total) by mouth daily with breakfast for 3 days, THEN 3 tablets (30 mg total) daily with breakfast for 3 days, THEN 2 tablets (20 mg total) daily with breakfast for 3 days, THEN 1 tablet (10 mg total) daily with breakfast for 3 days. 30 tablet 0   sertraline (ZOLOFT) 100 MG tablet Take 200 mg by mouth in the morning.     SUPREP BOWEL PREP KIT 17.5-3.13-1.6 GM/177ML SOLN Take 354 mLs by mouth as directed.     traZODone (DESYREL) 50 MG tablet Take 50 mg by mouth at bedtime.     verapamil (VERELAN PM) 360 MG 24 hr  capsule Take 360 mg by mouth in the morning.     budesonide-formoterol (SYMBICORT) 160-4.5 MCG/ACT inhaler Inhale 2 puffs into the lungs in the morning and at bedtime. 1 each 6   albuterol (VENTOLIN HFA) 108 (90 Base) MCG/ACT inhaler Inhale 2 puffs into the lungs every  6 (six) hours as needed for wheezing or shortness of breath. (Patient taking differently: Inhale 2 puffs into the lungs See admin instructions. Inhale 2 puffs scheduled twice daily & inhale every 6 hours if needed for wheezing/shortness of breath) 18 g 3   No facility-administered medications prior to visit.    Review of Systems  Constitutional:  Negative for chills, fever, malaise/fatigue and weight loss.  HENT:  Negative for congestion, sinus pain and sore throat.   Eyes:        +changes in vision  Respiratory:  Positive for cough and shortness of breath. Negative for hemoptysis, sputum production and wheezing.   Cardiovascular:  Negative for chest pain, palpitations, orthopnea, claudication and leg swelling.  Gastrointestinal:  Negative for abdominal pain, heartburn, nausea and vomiting.  Genitourinary: Negative.   Musculoskeletal:  Positive for back pain and joint pain. Negative for myalgias.  Skin:  Negative for rash.  Neurological:  Negative for weakness.  Endo/Heme/Allergies: Negative.   Psychiatric/Behavioral: Negative.     Objective:   Vitals:   02/24/21 1210  BP: 130/74  Pulse: 83  SpO2: 97%  Weight: (!) 375 lb (170.1 kg)  Height: $Remove'5\' 11"'dVYgYKk$  (1.803 m)   Physical Exam Constitutional:      General: She is not in acute distress.    Appearance: She is obese. She is not ill-appearing.  HENT:     Head: Normocephalic and atraumatic.     Nose: Nose normal.     Mouth/Throat:     Mouth: Mucous membranes are moist.  Eyes:     General: No scleral icterus.    Conjunctiva/sclera: Conjunctivae normal.     Pupils: Pupils are equal, round, and reactive to light.  Cardiovascular:     Rate and Rhythm: Normal rate and  regular rhythm.     Pulses: Normal pulses.     Heart sounds: Normal heart sounds. No murmur heard. Pulmonary:     Effort: Pulmonary effort is normal.     Breath sounds: Decreased breath sounds present. No wheezing, rhonchi or rales.  Abdominal:     General: Bowel sounds are normal.     Palpations: Abdomen is soft.  Musculoskeletal:     Right lower leg: No edema.     Left lower leg: No edema.  Lymphadenopathy:     Cervical: No cervical adenopathy.  Skin:    General: Skin is warm and dry.  Neurological:     General: No focal deficit present.     Mental Status: She is alert.  Psychiatric:        Mood and Affect: Mood normal.        Behavior: Behavior normal.        Thought Content: Thought content normal.        Judgment: Judgment normal.    CBC No results found for: WBC, RBC, HGB, HCT, PLT, MCV, MCH, MCHC, RDW, LYMPHSABS, MONOABS, EOSABS, BASOSABS  Chest imaging: HRCT Chest 10/19/20 1. Examination of the lungs is somewhat limited by body habitus and related photopenia. Within this limitation, there is mild, bandlike bibasilar scarring and or partial atelectasis without specific findings of pulmonary parenchymal sarcoidosis or other fibrotic interstitial lung disease. 2. Numerous prominent mediastinal and hilar lymph nodes, nonspecific although potentially in keeping with nodal sarcoidosis. 3. Lobular air trapping on expiratory phase imaging, suggestive of small airways disease. 4. Enlargement of the main pulmonary artery, as can be seen in pulmonary hypertension.  MRI Cardiac 02/07/2019 - report reviewed from records from [place] Multiple conglomerated  lymph nodes within the superior prevascular, right paratracheal and bilateral hilar, precarinal and subcarinal lymph nodes measuring 3.8 x 2.8cm para tracheal nodes. Bilateral multiple subpectoral and axillary lymph nodes measuring up to 1.1 x 0.8cm. New bilateral hypermetabolic diffuse groundglass and subcentimeter nodular  opacities.   PFT: PFT Results Latest Ref Rng & Units 12/15/2020  FVC-Pre L 1.83  FVC-Predicted Pre % 50  FVC-Post L 1.68  FVC-Predicted Post % 45  Pre FEV1/FVC % % 79  Post FEV1/FCV % % 83  FEV1-Pre L 1.45  FEV1-Predicted Pre % 49  FEV1-Post L 1.39  DLCO uncorrected ml/min/mmHg 21.12  DLCO UNC% % 81  DLVA Predicted % 128  TLC L 4.68  TLC % Predicted % 76  RV % Predicted % 92  PFT 2022: Mild restrictive defect present  Echo 09/28/20: EF 70-75%. Mild LVH. Grade I diastolic dysfunction. RV systolic function is normal. RV size is normal. LA is mildly dilated.   EKG 09/15/20: Normal sinus rhythm, PR 160, QRS 94, Qtc 493  LHC 04/24/2017 - from [place] records LVEDP mildly elevated LV systolic function is normal  Assessment & Plan:   Morbid obesity ([place]) - Plan: Amb ref to Medical Nutrition Therapy-MNT  Discussion: Julie Cruz is a 55 year old woman, former smoker with hypertension, pulmonary hypertension, fibromyalgia and sarcoidosis who returns to pulmonary clinic for follow up of sarcoidosis.  She has history of sarcoidosis with confirmed lung biopsy from 2007 at [facility] in [place]. She was previously treated with high dose steroids and then recommended to start methotrexate for involvement of uveitis and polyarthropathy by rheumatology. She did not start this treatment due to concerns of side effects.   She has ophthalmology visit in the next week and we will follow up evaluation and possible need for methotrexate based on their findings.  HRCT Chest with prominent hilar and mediastinal lymph nodes but no significant parenchymal involvement of sarcoid.   She is to use symbicort inhaler 2 puffs twice daily and albuterol as needed for her reactive airways symptoms.   We will refer her to a nutritionist for further recommendations on weight loss.   She is being followed by Dr. Ander Slade in Sleep clinic and has split night test coming up on 03/15/21.  Follow-up in 4  months.  Freda Jackson, MD [facility] Office: [phone]   Current Outpatient Medications:    acetaminophen (TYLENOL) 650 MG CR tablet, Take 650 mg by mouth every 8 (eight) hours as needed for pain., Disp: , Rfl:    aspirin 325 MG tablet, Take 325 mg by mouth daily as needed (pain/chest tightness)., Disp: , Rfl:    brimonidine (ALPHAGAN P) 0.1 % SOLN, Place 1 drop into both eyes in the morning and at bedtime., Disp: , Rfl:    clonazePAM (KLONOPIN) 0.5 MG tablet, Take 0.5 mg by mouth daily., Disp: , Rfl:    cyclobenzaprine (FLEXERIL) 10 MG tablet, Take 10-20 mg by mouth 3 (three) times daily as needed for muscle spasms., Disp: , Rfl:    hydrochlorothiazide (HYDRODIURIL) 50 MG tablet, Take 50 mg by mouth in the morning., Disp: , Rfl:    ipratropium-albuterol (DUONEB) 0.5-2.5 (3) MG/3ML SOLN, Take 3 mLs by nebulization every 6 (six) hours as needed., Disp: 360 mL, Rfl: 6   ketorolac (TORADOL) 10 MG tablet, , Disp: , Rfl:    lisinopril (ZESTRIL) 40 MG tablet, Take 40 mg by mouth in the morning., Disp: , Rfl:    Menthol (ICY  HOT) 5 % PTCH, Place 1 patch onto the skin daily as needed (pain.)., Disp: , Rfl:    Menthol, Topical Analgesic, (ICY HOT EX), Apply 1 application topically 3 (three) times daily as needed (pain.)., Disp: , Rfl:    methocarbamol (ROBAXIN) 750 MG tablet, Take 750 mg by mouth every 8 (eight) hours as needed for muscle spasms. PRN, Disp: , Rfl:    naproxen (NAPROSYN) 500 MG tablet, Take 1,000 mg by mouth daily as needed (pain.)., Disp: , Rfl:    pantoprazole (PROTONIX) 40 MG tablet, Take 40 mg by mouth in the morning., Disp: , Rfl:    potassium chloride (KLOR-CON) 10 MEQ tablet, Take 20 mEq by mouth in the morning., Disp: , Rfl:    prednisoLONE acetate (PRED FORTE) 1 % ophthalmic suspension, Place 1 drop into both eyes See admin instructions. Instill 1 drop into right eye scheduled twice daily for inflammation/pain & instil 1 drop into left eye up to  twice daily if needed for redness/inflammation/pain., Disp: , Rfl:    predniSONE (DELTASONE) 10 MG tablet, Take 4 tablets (40 mg total) by mouth daily with breakfast for 3 days, THEN 3 tablets (30 mg total) daily with breakfast for 3 days, THEN 2 tablets (20 mg total) daily with breakfast for 3 days, THEN 1 tablet (10 mg total) daily with breakfast for 3 days., Disp: 30 tablet, Rfl: 0   sertraline (ZOLOFT) 100 MG tablet, Take 200 mg by mouth in the morning., Disp: , Rfl:    SUPREP BOWEL PREP KIT 17.5-3.13-1.6 GM/177ML SOLN, Take 354 mLs by mouth as directed., Disp: , Rfl:    traZODone (DESYREL) 50 MG tablet, Take 50 mg by mouth at bedtime., Disp: , Rfl:    verapamil (VERELAN PM) 360 MG 24 hr capsule, Take 360 mg by mouth in the morning., Disp: , Rfl:    albuterol (VENTOLIN HFA) 108 (90 Base) MCG/ACT inhaler, Inhale 2 puffs into the lungs every 6 (six) hours as needed for wheezing or shortness of breath. (Patient taking differently: Inhale 2 puffs into the lungs See admin instructions. Inhale 2 puffs scheduled twice daily & inhale every 6 hours if needed for wheezing/shortness of breath), Disp: 18 g, Rfl: 3

## 2021-02-24 NOTE — Patient Instructions (Signed)
Please let us know what the ophthalmologist recommends regarding your vision and involvement of sarcoidosis.   We can place referral to rheumatology if needed.  Use symbicort 2 puffs twice daily  Finish steroid taper after covid 19 infection  Use albuterol inhaler 1-2 puffs every 4-6 hours as needed.

## 2021-03-01 DIAGNOSIS — H40023 Open angle with borderline findings, high risk, bilateral: Secondary | ICD-10-CM | POA: Diagnosis not present

## 2021-03-01 DIAGNOSIS — H35011 Changes in retinal vascular appearance, right eye: Secondary | ICD-10-CM | POA: Diagnosis not present

## 2021-03-01 DIAGNOSIS — D869 Sarcoidosis, unspecified: Secondary | ICD-10-CM | POA: Diagnosis not present

## 2021-03-01 DIAGNOSIS — H04123 Dry eye syndrome of bilateral lacrimal glands: Secondary | ICD-10-CM | POA: Diagnosis not present

## 2021-03-01 DIAGNOSIS — R6889 Other general symptoms and signs: Secondary | ICD-10-CM | POA: Diagnosis not present

## 2021-03-01 DIAGNOSIS — F319 Bipolar disorder, unspecified: Secondary | ICD-10-CM | POA: Diagnosis not present

## 2021-03-08 DIAGNOSIS — H409 Unspecified glaucoma: Secondary | ICD-10-CM | POA: Diagnosis not present

## 2021-03-08 DIAGNOSIS — D869 Sarcoidosis, unspecified: Secondary | ICD-10-CM | POA: Diagnosis not present

## 2021-03-08 DIAGNOSIS — G4730 Sleep apnea, unspecified: Secondary | ICD-10-CM | POA: Diagnosis not present

## 2021-03-08 DIAGNOSIS — R2689 Other abnormalities of gait and mobility: Secondary | ICD-10-CM | POA: Diagnosis not present

## 2021-03-08 DIAGNOSIS — M797 Fibromyalgia: Secondary | ICD-10-CM | POA: Diagnosis not present

## 2021-03-15 ENCOUNTER — Ambulatory Visit (HOSPITAL_BASED_OUTPATIENT_CLINIC_OR_DEPARTMENT_OTHER): Payer: Medicare HMO | Attending: Pulmonary Disease | Admitting: Pulmonary Disease

## 2021-03-15 ENCOUNTER — Other Ambulatory Visit: Payer: Self-pay

## 2021-03-15 DIAGNOSIS — R0683 Snoring: Secondary | ICD-10-CM | POA: Diagnosis not present

## 2021-03-15 DIAGNOSIS — I493 Ventricular premature depolarization: Secondary | ICD-10-CM | POA: Diagnosis not present

## 2021-03-15 DIAGNOSIS — G4733 Obstructive sleep apnea (adult) (pediatric): Secondary | ICD-10-CM | POA: Insufficient documentation

## 2021-03-15 DIAGNOSIS — R6889 Other general symptoms and signs: Secondary | ICD-10-CM | POA: Diagnosis not present

## 2021-03-16 DIAGNOSIS — R6889 Other general symptoms and signs: Secondary | ICD-10-CM | POA: Diagnosis not present

## 2021-03-20 ENCOUNTER — Telehealth: Payer: Self-pay | Admitting: Pulmonary Disease

## 2021-03-20 DIAGNOSIS — G4733 Obstructive sleep apnea (adult) (pediatric): Secondary | ICD-10-CM

## 2021-03-20 NOTE — Telephone Encounter (Signed)
Call patient  Sleep study result  Date of study: 03/15/2021  Impression: Moderate obstructive sleep apnea with moderate oxygen desaturations  Recommendation: DME referral  Recommend BiPAP therapy for moderate obstructive sleep apnea  - Trial of BiPAP therapy on 21/17 cm H2O with a Medium size Fisher&Paykel Full Face Mask Simplus mask and heated humidification with 2L oxygen piped in to system - Follow-up overnight oximetry in 2 to 4 weeks with BiPAP and oxygen supplementation in place to ascertain adequate oxygenation  Encourage weight loss measures  Follow-up in the office 4 to 6 weeks following initiation of treatment

## 2021-03-20 NOTE — Procedures (Signed)
POLYSOMNOGRAPHY  Last, First: Julie DavidsonHeadley, Julie Cruz MRN: [id number] Gender: Female Age (years): 89 Weight (lbs): 362 DOB: [birth year] BMI: 50 Primary Care: No PCP Epworth Score: 10 Referring: Tomma LightningAdewale A Sho Salguero MD Technician: Shelah LewandowskyGregory, Kenyon Interpreting: Tomma LightningAdewale A Marquel Pottenger MD Study Type: Split Night BiPAP Ordered Study Type: Split Night CPAP Study date: 03/15/2021 Location: [place] CLINICAL INFORMATION Julie Cruz is a 56 year old Female and was referred to the sleep center for evaluation of G47.33 OSA: Adult and Pediatric (327.23). Indications include Excessive Daytime Sleepiness, Fatigue, Hypertension, Morning Headaches, Obesity, OSA, Sleep walking/talking/parasomnias, Snoring, Witnessed Apneas.  MEDICATIONS Patient self administered medications include: TRAZODONE. Medications administered during study include No sleep medicine administered.  SLEEP STUDY TECHNIQUE The patient underwent an attended overnight level one polysomnography titration to assess the effects of BIPAP therapy. The following variables were monitored: EEG (C4-A1, C3-A2, O1-A2, O2-A1), EOG, submental and leg EMG, ECG, oxyhemoglobin saturation by pulse oximetry, thoracic and abdominal respiratory effort belts, nasal/oral airflow by pressure sensor, body position sensor and snoring sensor. BIPAP pressure was titrated to eliminate apneas, hypopneas and oxygen desaturation.Hypopneas were scored per AASM definition IB (4% desaturation)  The NPSG portion of the study ended at 1:31:49 AM. The BiPAP titration was initiated at 1:36:14 AM AM with the BIPAP portion of the study ending at 5:24:05 AM.  TECHNICIAN COMMENTS Comments added by Technician: None Comments added by Scorer: N/A SLEEP ARCHITECTURE The recording time for the entire night was 396.9 minutes.  The diagnostic portion was initiated at 10:47:13 PM and terminated at 1:31:49 AM. The time in bed was 164.6 minutes. EEG confirmed total sleep time was 141.5  minutes yielding a sleep efficiency of 86.0%%. Sleep onset after lights out was 16.9 minutes with a REM latency of N/A minutes. The patient spent 11.0%% of the night in stage N1 sleep, 89.0% % in stage N2 sleep, 0.0%% in stage N3 and 0% in REM. The Arousal Index was 12.3/hour.  The titration portion was initiated at 1:36:14 AM and terminated at 5:24:05 AM. The time in bed was 227.8 minutes. EEG confirmed total sleep time was 209.0 minutes yielding a sleep efficiency of 91.7%%. Sleep onset after BiPAP initiation was 0.9 minutes with a REM latency of 71.5 minutes. The patient spent 7.9%% of the night in stage N1 sleep, 77.8%% in stage N2 sleep, 0.0%% in stage N3 and 14.4% in REM. The Arousal Index was 12.9/hour. RESPIRATORY PARAMETERS During the diagnostic portion, there were a total of 74 respiratory disturbances recorded; 1 apneas ( 1 obstructive, 0 mixed, 0 central), 59 hypopneas and 14 RERAs. The apnea/hypopnea index 25.4 was events/hour and the RDI was 31.4 events/hour. The central sleep apnea index was 0.0 events/hour. The REM AHI was N/A/h and NREM AHI was 25.4/h. The REM RDI was 0.0/h and NREM RDI was 31.4/h. The supine AHI was 24.3/h, and the non supine AHI was 37.2/h; supine during 90.9%% of sleep. The supine RDI was 28.5/h, and the non supine RDI was 60.38/h. Respiratory disturbances were associated with oxygen desaturation down to a nadir of 77.0 % during sleep. The mean oxygen saturation during the study was 85.7%. The cumulative time under 88% oxygen saturation was 134.7 minutes.  During the titration portion, the apnea/hypopnea index (AHI) was 15.5 events/hour and the RDI was 16.9 events/hour. The central sleep apnea index was events/hour. The most appropriate setting of BiPAP was IPAP/EPAP 25/21 cm H2O. At this setting, the sleep efficiency was 99% and the patient was supine for 100%. The AHI was 0 events per hour(with  0 central events). Oxygen nadir was 88.0 . LEG MOVEMENT DATA The periodic  limb movement index was 0.0/hour with an associated arousal index of /hour. CARDIAC DATA The underlying cardiac rhythm was most consistent with sinus rhythm. Mean heart rate was 71.3 during diagnostic portion and 69.7 during titration portion of study. Additional rhythm abnormalities include PVCs.  IMPRESSIONS - Electrocardiographic data showed presence of PVCs. - The patient snored with moderate snoring volume. - Moderate Obstructive Sleep apnea(OSA) Optimal pressure attained. - No Significant Central Sleep Apnea (CSA) - Severe Oxygen Desaturation - No significant periodic leg movements(PLMs) during sleep. However, no significant associated arousals. - Normal sleep efficiency, normal primary sleep latency, long REM sleep latency and no slow wave latency.  DIAGNOSIS - Obstructive Sleep Apnea (G47.33)  RECOMMENDATIONS - Trial of BiPAP therapy on 21/17 cm H2O with a Medium size Fisher&Paykel Full Face Mask Simplus mask and heated humidification with 2L oxygen piped in to system - Follow-up overnight oximetry in 2 to 4 weeks with BiPAP and oxygen supplementation in place to ascertain adequate oxygenation - Avoid alcohol, sedatives and other CNS depressants that may worsen sleep apnea and disrupt normal sleep architecture. - Sleep hygiene should be reviewed to assess factors that may improve sleep quality. - Weight management and regular exercise should be initiated or continued. - Follow-up as previously scheduled  [Electronically signed] 03/20/2021 12:56 PM  Virl Diamond MD NPI: [id number]

## 2021-03-23 NOTE — Telephone Encounter (Signed)
I have called the patient and she is agreeable to the Bipap and she was concerned about doing the ONO 4 weeks after the set up for Bipap. Per the patient she is agreeable to start the new orders.

## 2021-03-24 ENCOUNTER — Telehealth: Payer: Self-pay | Admitting: Cardiology

## 2021-03-24 NOTE — Telephone Encounter (Signed)
Patient states that she calling in to see when is the chip for her machine going to get implanted in her. Please advise

## 2021-03-25 NOTE — Telephone Encounter (Signed)
Spoke with the pt  She states that she got a call an hour ago from [place] that they are setting her up for BIPAP  She is aware that she needs ONO done 4 wks after set up  She states that she wants to let JD know that she has noticed tingling and numbness in her hands and right leg  I advised her that I will let MD be aware, and that she should give her PCP a call to let them know  Pt verbalized understanding Sending to Dr Erin Fulling as Juluis Rainier

## 2021-03-25 NOTE — Telephone Encounter (Signed)
Spoke with patient.  She is referring to Linq implant.  The one she has is at [facility].  At last OV 01/11/21, it was discussed about whether the device needs to be replaced or just removed.  Per pt Dr. Elberta Fortis indicated he would like to review [facility] records in order to determine if device should be replaced.  Pt is wondering where we are in that process.  Advised I would forward message to Dr. Elberta Fortis and his nurse.

## 2021-03-25 NOTE — Telephone Encounter (Signed)
Will follow up with medical records (Requested records on 01/11/2021)

## 2021-03-30 ENCOUNTER — Telehealth: Payer: Self-pay | Admitting: Pulmonary Disease

## 2021-03-30 DIAGNOSIS — D869 Sarcoidosis, unspecified: Secondary | ICD-10-CM

## 2021-03-30 NOTE — Telephone Encounter (Signed)
Called [facility] and they were unable to tell me what the pt is needing  They advised that she may need to place order for new supplies    Called the pt and she states all she is needing is order for neb filters  I have placed order for this and she states nothing further needed

## 2021-03-31 ENCOUNTER — Telehealth: Payer: Self-pay | Admitting: Pulmonary Disease

## 2021-03-31 DIAGNOSIS — J683 Other acute and subacute respiratory conditions due to chemicals, gases, fumes and vapors: Secondary | ICD-10-CM

## 2021-03-31 NOTE — Telephone Encounter (Signed)
Reactive airways disease will do.  JD

## 2021-03-31 NOTE — Telephone Encounter (Signed)
Order was placed 03/30/21 for the nebulizer and supplies with diagnosis sarcoidosis. [facility] needs a different diagnosis instead of the sarcoidosis diagnosis. Dr. Francine Gravenewald, please advise on this. I have the order pended that we need to send to [facility] once we have a different diagnosis.

## 2021-03-31 NOTE — Telephone Encounter (Signed)
New order sent with reactive airways dz

## 2021-04-05 ENCOUNTER — Telehealth: Payer: Self-pay | Admitting: Pulmonary Disease

## 2021-04-05 DIAGNOSIS — G4733 Obstructive sleep apnea (adult) (pediatric): Secondary | ICD-10-CM

## 2021-04-05 NOTE — Telephone Encounter (Signed)
Order for O2 has been placed. [facility] is aware.   Nothing further needed.

## 2021-04-05 NOTE — Telephone Encounter (Signed)
Called and spoke with Julie Cruz at [facility]. She stated that they received the order for the bipap but not for the 2L to be bled into the bipap.   Dr. Francine Graven, are you ok with signing the order for O2 since AO will not be available for a few weeks?

## 2021-04-05 NOTE — Telephone Encounter (Signed)
Yes, that is ok to place O2 order under my name.  Thanks, JD

## 2021-04-07 DIAGNOSIS — R6889 Other general symptoms and signs: Secondary | ICD-10-CM | POA: Diagnosis not present

## 2021-04-07 DIAGNOSIS — H40023 Open angle with borderline findings, high risk, bilateral: Secondary | ICD-10-CM | POA: Diagnosis not present

## 2021-04-07 DIAGNOSIS — H04123 Dry eye syndrome of bilateral lacrimal glands: Secondary | ICD-10-CM | POA: Diagnosis not present

## 2021-04-08 ENCOUNTER — Telehealth: Payer: Self-pay | Admitting: Cardiology

## 2021-04-08 NOTE — Telephone Encounter (Signed)
Patient c/o Palpitations:  High priority if patient c/o lightheadedness, shortness of breath, or chest pain  How long have you had palpitations/irregular HR/ Afib? Are you having the symptoms now? 2 weeks ago and no  Are you currently experiencing lightheadedness, SOB or CP? When she gets up she has to catch herself if not her head is spinning and on and underneath her left breast chest pain  Do you have a history of afib (atrial fibrillation) or irregular heart rhythm? Not sure  Have you checked your BP or HR? (document readings if available): no  Are you experiencing any other symptoms? 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

## 2021-04-08 NOTE — Telephone Encounter (Signed)
Pt has has increase in palpitations since she came down with covid in early December of last year. Occasional sharp pains under her arm. HA but contributes not having her cpap machine yet She was also given strong steroids for wheezing.  She would like to know where we are with things and have we received records.  Pt aware we have not but that I would personally call/deal with getting these records myself next week.  Aware I will follow up with her to inform her of where we are with getting records. Aware that it will be couple weeks before [facility] can review/advise. Patient verbalized understanding and agreeable to plan.

## 2021-04-11 NOTE — Telephone Encounter (Signed)
Pt aware I spoke with Medical Records at [facility], [place]/Dr. Bethann Punches office [phone]). Pt aware our MR will emial her release form to resign and we can request records again. [facility] stated they should be able to get [name] the records this week possibly. Our medical records is emailing pt release today.

## 2021-04-12 ENCOUNTER — Other Ambulatory Visit: Payer: Self-pay | Admitting: Cardiology

## 2021-04-12 ENCOUNTER — Encounter: Payer: Self-pay | Admitting: Pulmonary Disease

## 2021-04-12 ENCOUNTER — Telehealth: Payer: Self-pay | Admitting: *Deleted

## 2021-04-12 ENCOUNTER — Ambulatory Visit (INDEPENDENT_AMBULATORY_CARE_PROVIDER_SITE_OTHER): Payer: Medicare HMO | Admitting: Pulmonary Disease

## 2021-04-12 ENCOUNTER — Other Ambulatory Visit: Payer: Self-pay

## 2021-04-12 VITALS — BP 130/80 | HR 82 | Ht 71.0 in | Wt 357.0 lb

## 2021-04-12 DIAGNOSIS — D869 Sarcoidosis, unspecified: Secondary | ICD-10-CM | POA: Diagnosis not present

## 2021-04-12 DIAGNOSIS — J683 Other acute and subacute respiratory conditions due to chemicals, gases, fumes and vapors: Secondary | ICD-10-CM

## 2021-04-12 DIAGNOSIS — G4733 Obstructive sleep apnea (adult) (pediatric): Secondary | ICD-10-CM | POA: Diagnosis not present

## 2021-04-12 DIAGNOSIS — I272 Pulmonary hypertension, unspecified: Secondary | ICD-10-CM | POA: Diagnosis not present

## 2021-04-12 DIAGNOSIS — F319 Bipolar disorder, unspecified: Secondary | ICD-10-CM | POA: Diagnosis not present

## 2021-04-12 MED ORDER — STIOLTO RESPIMAT 2.5-2.5 MCG/ACT IN AERS: 2.0000 | INHALATION_SPRAY | Freq: Every day | RESPIRATORY_TRACT | 0 refills | Status: DC

## 2021-04-12 NOTE — Telephone Encounter (Signed)
I have the pt on the line reaching out in regards to an email you sent... please advise

## 2021-04-12 NOTE — Progress Notes (Signed)
f  Synopsis: Referred in August 2022 for Sarcoidosis   Subjective:   PATIENT ID: Julie Cruz GENDER: female DOB: 1965/05/26, MRN: [id number]  HPI  Chief Complaint  Patient presents with   Follow-up    F/U after having COVID in December 2022. Increased SOB, wheezing and fatigue.    [name] is a 56 year old woman, former smoker with hypertension, pulmonary hypertension, fibromyalgia and sarcoidosis who returns to pulmonary clinic for follow up of sarcoidosis.  She has not started her symbicort inhaler as she is concerned about the side effects. She is using albuterol 2 to 4 times per day for shortness of breath, cough or wheezing. She recently had ophthalmology evaluation and they did not note inflammatory involvement related to sarcoidosis. She does have vision loss in her right eye due to retina issues and she is also being treated for glaucoma.   She was contacted by a nutritionist after last visit but this is not covered by her insurance. She has been referred to the weight loss clinis.  OV 02/24/21 She tested positive for covid 19 on 12/13 and was placed on 12 day prednisone taper on 12/21 for her symptoms of cough and dyspnea. She is much improved at this time. She is using albuterol as needed for her wheezing or dyspnea and using it on a daily basis. She has not started the Symbicort inhaler that was sent in for her after the HRCT Chest was performed showing small airways disease.   PFTs from 10/22 show a restrictive defect with normal diffusion capacity. She is scheduled for split night sleep study 03/15/20. She has ophthalmology visit next week.   She brought in papers from [place]. [place] where she was prescribed methotrexate for sarcoidosis, uveitis and polyarthropathy. She was seen by ophthalmology and rheumatology.   OV 10/06/20 She reports being diagnosed with sarcoidosis at age 63 via lung biopsy in 2007 at [facility] in [place].  She was initially on  prednisone for treatment but then due to side effects has stopped this therapy and has been weary of using methotrexate so she has not required systemic therapy over the last few years.  She denies progressive dyspnea since deciding not to have systemic treatment of her sarcoidosis. She is currently using albuterol twice daily for shortness of breath.  She was previously on Advair but did not like this inhaler.   She has history of obstructive sleep apnea and has a CPAP machine but has not used it in months as it is one of the machines included in the recent recall.  She has gained 20 to 40 pounds over the last couple of years.  Past Medical History:  Diagnosis Date   Acute medial meniscus tear    Anxiety    Bipolar 1 disorder (HCC)    Depression    Fibromyalgia    H/O spinal cord injury    T, L ans CSpine   Hypertension    Menopause    Pulmonary hypertension (HCC)    Sarcoid, cardiac      Family History  Problem Relation Age of Onset   Hyperlipidemia Mother    Asthma Mother    Thyroid disease Mother    Depression Mother    Rheum arthritis Mother    Cancer Father    Heart attack Brother    Depression Brother    HIV/AIDS Brother      Social History   Socioeconomic History   Marital status: Single  Spouse name: Not on file   Number of children: Not on file   Years of education: Not on file   Highest education level: Not on file  Occupational History   Not on file  Tobacco Use   Smoking status: Former    Types: Cigarettes   Smokeless tobacco: Never  Vaping Use   Vaping Use: Never used  Substance and Sexual Activity   Alcohol use: Yes    Alcohol/week: 2.0 standard drinks    Types: 2 Glasses of wine per week    Comment: Daily   Drug use: Never   Sexual activity: Not Currently  Other Topics Concern   Not on file  Social History Narrative   Not on file   Social Determinants of Health   Financial Resource Strain: Not on file  Food Insecurity: Not on file   Transportation Needs: Not on file  Physical Activity: Not on file  Stress: Not on file  Social Connections: Not on file  Intimate Partner Violence: Not on file     No Known Allergies   Outpatient Medications Prior to Visit  Medication Sig Dispense Refill   acetaminophen (TYLENOL) 650 MG CR tablet Take 650 mg by mouth every 8 (eight) hours as needed for pain.     aspirin 325 MG tablet Take 325 mg by mouth daily as needed (pain/chest tightness).     brimonidine (ALPHAGAN P) 0.1 % SOLN Place 1 drop into both eyes in the morning and at bedtime.     clonazePAM (KLONOPIN) 0.5 MG tablet Take 0.5 mg by mouth daily.     cyclobenzaprine (FLEXERIL) 10 MG tablet Take 10-20 mg by mouth 3 (three) times daily as needed for muscle spasms.     dorzolamide (TRUSOPT) 2 % ophthalmic solution      hydrochlorothiazide (HYDRODIURIL) 50 MG tablet Take 50 mg by mouth in the morning.     ipratropium-albuterol (DUONEB) 0.5-2.5 (3) MG/3ML SOLN Take 3 mLs by nebulization every 6 (six) hours as needed. 360 mL 6   ketorolac (TORADOL) 10 MG tablet      lisinopril (ZESTRIL) 40 MG tablet Take 40 mg by mouth in the morning.     Menthol (ICY HOT) 5 % PTCH Place 1 patch onto the skin daily as needed (pain.).     Menthol, Topical Analgesic, (ICY HOT EX) Apply 1 application topically 3 (three) times daily as needed (pain.).     methocarbamol (ROBAXIN) 750 MG tablet Take 750 mg by mouth every 8 (eight) hours as needed for muscle spasms. PRN     naproxen (NAPROSYN) 500 MG tablet Take 1,000 mg by mouth daily as needed (pain.).     pantoprazole (PROTONIX) 40 MG tablet Take 40 mg by mouth in the morning.     potassium chloride (KLOR-CON) 10 MEQ tablet Take 20 mEq by mouth in the morning.     prednisoLONE acetate (PRED FORTE) 1 % ophthalmic suspension Place 1 drop into both eyes See admin instructions. Instill 1 drop into right eye scheduled twice daily for inflammation/pain & instil 1 drop into left eye up to twice daily if needed  for redness/inflammation/pain.     sertraline (ZOLOFT) 100 MG tablet Take 200 mg by mouth in the morning.     traZODone (DESYREL) 50 MG tablet Take 50 mg by mouth at bedtime.     verapamil (VERELAN PM) 360 MG 24 hr capsule Take 360 mg by mouth in the morning.     albuterol (VENTOLIN HFA) 108 (90 Base)  MCG/ACT inhaler Inhale 2 puffs into the lungs every 6 (six) hours as needed for wheezing or shortness of breath. (Patient taking differently: Inhale 2 puffs into the lungs See admin instructions. Inhale 2 puffs scheduled twice daily & inhale every 6 hours if needed for wheezing/shortness of breath) 18 g 3   brimonidine (ALPHAGAN) 0.2 % ophthalmic solution      SUPREP BOWEL PREP KIT 17.5-3.13-1.6 GM/177ML SOLN Take 354 mLs by mouth as directed.     No facility-administered medications prior to visit.    Review of Systems  Constitutional:  Negative for chills, fever, malaise/fatigue and weight loss.  HENT:  Negative for congestion, sinus pain and sore throat.   Eyes:        +changes in vision  Respiratory:  Positive for cough and shortness of breath. Negative for hemoptysis, sputum production and wheezing.   Cardiovascular:  Negative for chest pain, palpitations, orthopnea, claudication and leg swelling.  Gastrointestinal:  Negative for abdominal pain, heartburn, nausea and vomiting.  Genitourinary: Negative.   Musculoskeletal:  Positive for back pain and joint pain. Negative for myalgias.  Skin:  Negative for rash.  Neurological:  Negative for weakness.  Endo/Heme/Allergies: Negative.   Psychiatric/Behavioral: Negative.     Objective:   Vitals:   04/12/21 1506  BP: 130/80  Pulse: 82  SpO2: 95%  Weight: (!) 357 lb (161.9 kg)  Height: $Remove'5\' 11"'jaYSUQN$  (1.803 m)   Physical Exam Constitutional:      General: She is not in acute distress.    Appearance: She is obese. She is not ill-appearing.  HENT:     Head: Normocephalic and atraumatic.     Nose: Nose normal.     Mouth/Throat:     Mouth:  Mucous membranes are moist.  Eyes:     General: No scleral icterus.    Conjunctiva/sclera: Conjunctivae normal.     Pupils: Pupils are equal, round, and reactive to light.  Cardiovascular:     Rate and Rhythm: Normal rate and regular rhythm.     Pulses: Normal pulses.     Heart sounds: Normal heart sounds. No murmur heard. Pulmonary:     Effort: Pulmonary effort is normal.     Breath sounds: Decreased breath sounds present. No wheezing, rhonchi or rales.  Abdominal:     General: Bowel sounds are normal.     Palpations: Abdomen is soft.  Musculoskeletal:     Right lower leg: No edema.     Left lower leg: No edema.  Lymphadenopathy:     Cervical: No cervical adenopathy.  Skin:    General: Skin is warm and dry.  Neurological:     General: No focal deficit present.     Mental Status: She is alert.  Psychiatric:        Mood and Affect: Mood normal.        Behavior: Behavior normal.        Thought Content: Thought content normal.        Judgment: Judgment normal.    CBC No results found for: WBC, RBC, HGB, HCT, PLT, MCV, MCH, MCHC, RDW, LYMPHSABS, MONOABS, EOSABS, BASOSABS  Chest imaging: HRCT Chest 10/19/20 1. Examination of the lungs is somewhat limited by body habitus and related photopenia. Within this limitation, there is mild, bandlike bibasilar scarring and or partial atelectasis without specific findings of pulmonary parenchymal sarcoidosis or other fibrotic interstitial lung disease. 2. Numerous prominent mediastinal and hilar lymph nodes, nonspecific although potentially in keeping with nodal sarcoidosis. 3. Lobular  air trapping on expiratory phase imaging, suggestive of small airways disease. 4. Enlargement of the main pulmonary artery, as can be seen in pulmonary hypertension.  MRI Cardiac 02/07/2019 - report reviewed from records from [place] Multiple conglomerated lymph nodes within the superior prevascular, right paratracheal and bilateral hilar, precarinal and  subcarinal lymph nodes measuring 3.8 x 2.8cm para tracheal nodes. Bilateral multiple subpectoral and axillary lymph nodes measuring up to 1.1 x 0.8cm. New bilateral hypermetabolic diffuse groundglass and subcentimeter nodular opacities.   PFT: PFT Results Latest Ref Rng & Units 12/15/2020  FVC-Pre L 1.83  FVC-Predicted Pre % 50  FVC-Post L 1.68  FVC-Predicted Post % 45  Pre FEV1/FVC % % 79  Post FEV1/FCV % % 83  FEV1-Pre L 1.45  FEV1-Predicted Pre % 49  FEV1-Post L 1.39  DLCO uncorrected ml/min/mmHg 21.12  DLCO UNC% % 81  DLVA Predicted % 128  TLC L 4.68  TLC % Predicted % 76  RV % Predicted % 92  PFT 2022: Mild restrictive defect present  Echo 09/28/20: EF 70-75%. Mild LVH. Grade I diastolic dysfunction. RV systolic function is normal. RV size is normal. LA is mildly dilated.   EKG 09/15/20: Normal sinus rhythm, PR 160, QRS 94, Qtc 493  LHC 04/24/2017 - from [place] records LVEDP mildly elevated LV systolic function is normal  Assessment & Plan:   Sarcoidosis  Morbid obesity (HCC)  Reactive airways dysfunction syndrome (HCC)  OSA (obstructive sleep apnea)  Discussion: Julie Cruz is a 56 year old woman, former smoker with hypertension, pulmonary hypertension, fibromyalgia and sarcoidosis who returns to pulmonary clinic for follow up of sarcoidosis.  She has history of sarcoidosis with confirmed lung biopsy from 2007 at [facility] in [place]. She was previously treated with high dose steroids and then recommended to start methotrexate for involvement of uveitis and polyarthropathy by rheumatology. She did not start this treatment due to concerns of side effects. HRCTChest  10/19/20 shows prominent hilar and mediastinal lymph nodes but no significant parenchymal involvement of sarcoidosis.   She has glaucoma based on recent ophthalmology evaluation and no uveitis or inflammation of her eyes related to sarcoidosis.   Given the new diagnosis of glaucoma she is to  use stiolto 2 puffs daily for her reactive airways disease and we will avoid ICS inhalers.  She is on bipap therapy for her sleep apnea and is awaiting supplemental oxygen to use with the machine.   She is waiting for appointment with the weight management clinic. Unable to be seen by a nutritionist due to her [employer].  She has ophthalmology visit in the next week and we will follow up evaluation and possible need for methotrexate based on their findings.  Follow-up in 6 months.  Melody Comas, MD [facility] Office: [phone]   Current Outpatient Medications:    acetaminophen (TYLENOL) 650 MG CR tablet, Take 650 mg by mouth every 8 (eight) hours as needed for pain., Disp: , Rfl:    aspirin 325 MG tablet, Take 325 mg by mouth daily as needed (pain/chest tightness)., Disp: , Rfl:    brimonidine (ALPHAGAN P) 0.1 % SOLN, Place 1 drop into both eyes in the morning and at bedtime., Disp: , Rfl:    clonazePAM (KLONOPIN) 0.5 MG tablet, Take 0.5 mg by mouth daily., Disp: , Rfl:    cyclobenzaprine (FLEXERIL) 10 MG tablet, Take 10-20 mg by mouth 3 (three) times daily as needed for muscle spasms., Disp: , Rfl:  dorzolamide (TRUSOPT) 2 % ophthalmic solution, , Disp: , Rfl:    hydrochlorothiazide (HYDRODIURIL) 50 MG tablet, Take 50 mg by mouth in the morning., Disp: , Rfl:    ipratropium-albuterol (DUONEB) 0.5-2.5 (3) MG/3ML SOLN, Take 3 mLs by nebulization every 6 (six) hours as needed., Disp: 360 mL, Rfl: 6   ketorolac (TORADOL) 10 MG tablet, , Disp: , Rfl:    lisinopril (ZESTRIL) 40 MG tablet, Take 40 mg by mouth in the morning., Disp: , Rfl:    Menthol (ICY HOT) 5 % PTCH, Place 1 patch onto the skin daily as needed (pain.)., Disp: , Rfl:    Menthol, Topical Analgesic, (ICY HOT EX), Apply 1 application topically 3 (three) times daily as needed (pain.)., Disp: , Rfl:    methocarbamol (ROBAXIN) 750 MG tablet, Take 750 mg by mouth every 8 (eight) hours as needed  for muscle spasms. PRN, Disp: , Rfl:    naproxen (NAPROSYN) 500 MG tablet, Take 1,000 mg by mouth daily as needed (pain.)., Disp: , Rfl:    pantoprazole (PROTONIX) 40 MG tablet, Take 40 mg by mouth in the morning., Disp: , Rfl:    potassium chloride (KLOR-CON) 10 MEQ tablet, Take 20 mEq by mouth in the morning., Disp: , Rfl:    prednisoLONE acetate (PRED FORTE) 1 % ophthalmic suspension, Place 1 drop into both eyes See admin instructions. Instill 1 drop into right eye scheduled twice daily for inflammation/pain & instil 1 drop into left eye up to twice daily if needed for redness/inflammation/pain., Disp: , Rfl:    sertraline (ZOLOFT) 100 MG tablet, Take 200 mg by mouth in the morning., Disp: , Rfl:    traZODone (DESYREL) 50 MG tablet, Take 50 mg by mouth at bedtime., Disp: , Rfl:    verapamil (VERELAN PM) 360 MG 24 hr capsule, Take 360 mg by mouth in the morning., Disp: , Rfl:    albuterol (VENTOLIN HFA) 108 (90 Base) MCG/ACT inhaler, Inhale 2 puffs into the lungs every 6 (six) hours as needed for wheezing or shortness of breath. (Patient taking differently: Inhale 2 puffs into the lungs See admin instructions. Inhale 2 puffs scheduled twice daily & inhale every 6 hours if needed for wheezing/shortness of breath), Disp: 18 g, Rfl: 3

## 2021-04-12 NOTE — Patient Instructions (Addendum)
We will start you on stiolto 2 puffs daily for obstructive lung disease  Continue on albuterol 1-2 puffs every 4-6 hours as needed  We will check on the nutrition referral we placed at last visit  Start bipap therapy when your oxygen arrives

## 2021-04-12 NOTE — Progress Notes (Signed)
Patient seen in the office today and instructed on use of Stiolto.  Patient expressed understanding and demonstrated technique.  Julie LoronCherina Domonic [facility] CenterCMA 04/12/2021

## 2021-04-18 DIAGNOSIS — M5459 Other low back pain: Secondary | ICD-10-CM | POA: Diagnosis not present

## 2021-04-18 DIAGNOSIS — G4730 Sleep apnea, unspecified: Secondary | ICD-10-CM | POA: Diagnosis not present

## 2021-04-18 DIAGNOSIS — D869 Sarcoidosis, unspecified: Secondary | ICD-10-CM | POA: Diagnosis not present

## 2021-04-18 DIAGNOSIS — F419 Anxiety disorder, unspecified: Secondary | ICD-10-CM | POA: Diagnosis not present

## 2021-04-18 DIAGNOSIS — R2689 Other abnormalities of gait and mobility: Secondary | ICD-10-CM | POA: Diagnosis not present

## 2021-04-18 DIAGNOSIS — H409 Unspecified glaucoma: Secondary | ICD-10-CM | POA: Diagnosis not present

## 2021-04-18 DIAGNOSIS — M797 Fibromyalgia: Secondary | ICD-10-CM | POA: Diagnosis not present

## 2021-04-19 ENCOUNTER — Encounter: Payer: Self-pay | Admitting: Pulmonary Disease

## 2021-04-20 ENCOUNTER — Telehealth: Payer: Self-pay | Admitting: Pulmonary Disease

## 2021-04-20 NOTE — Telephone Encounter (Signed)
Spoke to patient.  Patient stated that she developed dizziness, headache and palpations after using Stiolto. She stopped Stiolto 2 days and sx subsided.  Today she fell asleep without bipap and oxygen on. She woke up gasping for air. Since she had increased sob. She is questioning if she can use oxygen during the day. She does not monitor oxygen levels. She wears 2L QHS bled into bipap.    She also would like to resume Advair.    Dr. Francine Graven, please advise. Thanks

## 2021-04-20 NOTE — Telephone Encounter (Signed)
Spoke to patient and relayed below message/recommendations.  She will talk with ophthalmologist tomorrow. She will call back if she wishes to proceed with walk test and Advair.  Nothing further needed at this time.

## 2021-04-20 NOTE — Telephone Encounter (Signed)
Pt c/o Shortness Of Breath: STAT if SOB developed within the last 24 hours or pt is noticeably SOB on the phone  1. Are you currently SOB (can you hear that pt is SOB on the phone)?  No   2. How long have you been experiencing SOB?  Patient has always been SOB, but states it is becoming progressively worse  3. Are you SOB when sitting or when up moving around?   4. Are you currently experiencing any other symptoms?  No, but patient states she just found out she has glaucoma in her right eye  Patient following up. States her main concern is medical records release form. She states when she is unable to open the email and would like to know if anything else can be done. She mentioned she has also had increased SOB.

## 2021-04-20 NOTE — Telephone Encounter (Addendum)
Spent 20 min on the phone with pt °She has not heard back from anyone about her email that she can't open for release form signature. °Aware I will forward this back to them, last send to medical records on 2/13, to return call patient. °Pt aware I will follow up by Monday to see if this has been completed. ° ° ° °EP side note:  °She does report that she is now on Bipap w/ O2 for sleep. °She has a pulmonary 6 min walk test next week. ° °

## 2021-04-20 NOTE — Telephone Encounter (Incomplete Revision)
Spent 20 min on the phone with pt She has not heard back from anyone about her email that she can't open for release form signature. Aware I will forward this back to them, last send to medical records on 2/13, to return call patient. Pt aware I will follow up by Monday to see if this has been completed.    EP side note:  She does report that she is now on Bipap w/ O2 for sleep. She has a pulmonary 6 min walk test next week.

## 2021-04-21 DIAGNOSIS — H401113 Primary open-angle glaucoma, right eye, severe stage: Secondary | ICD-10-CM | POA: Diagnosis not present

## 2021-04-21 DIAGNOSIS — H40022 Open angle with borderline findings, high risk, left eye: Secondary | ICD-10-CM | POA: Diagnosis not present

## 2021-04-21 DIAGNOSIS — H04123 Dry eye syndrome of bilateral lacrimal glands: Secondary | ICD-10-CM | POA: Diagnosis not present

## 2021-04-26 NOTE — Telephone Encounter (Signed)
Any updates on obtaining this paperwork this week? Thanks

## 2021-04-27 DIAGNOSIS — H524 Presbyopia: Secondary | ICD-10-CM | POA: Diagnosis not present

## 2021-04-27 DIAGNOSIS — H04123 Dry eye syndrome of bilateral lacrimal glands: Secondary | ICD-10-CM | POA: Diagnosis not present

## 2021-04-27 DIAGNOSIS — H40022 Open angle with borderline findings, high risk, left eye: Secondary | ICD-10-CM | POA: Diagnosis not present

## 2021-04-27 DIAGNOSIS — H401113 Primary open-angle glaucoma, right eye, severe stage: Secondary | ICD-10-CM | POA: Diagnosis not present

## 2021-04-27 DIAGNOSIS — R6889 Other general symptoms and signs: Secondary | ICD-10-CM | POA: Diagnosis not present

## 2021-05-02 NOTE — Telephone Encounter (Signed)
Patient called and said she will be getting a paper copy of her records from her MD in [place]. Once she gets those records she will bring them to our office  ?

## 2021-05-17 ENCOUNTER — Emergency Department (HOSPITAL_COMMUNITY): Payer: Medicare HMO

## 2021-05-17 ENCOUNTER — Emergency Department (HOSPITAL_COMMUNITY)
Admission: EM | Admit: 2021-05-17 | Discharge: 2021-05-17 | Disposition: A | Discharge status: Home / Self Care | Payer: Medicare HMO | Attending: Emergency Medicine | Admitting: Emergency Medicine

## 2021-05-17 ENCOUNTER — Other Ambulatory Visit: Payer: Self-pay

## 2021-05-17 ENCOUNTER — Encounter (HOSPITAL_COMMUNITY): Payer: Self-pay | Admitting: *Deleted

## 2021-05-17 DIAGNOSIS — H538 Other visual disturbances: Secondary | ICD-10-CM | POA: Diagnosis not present

## 2021-05-17 DIAGNOSIS — E876 Hypokalemia: Secondary | ICD-10-CM | POA: Diagnosis not present

## 2021-05-17 DIAGNOSIS — R0602 Shortness of breath: Secondary | ICD-10-CM | POA: Diagnosis not present

## 2021-05-17 DIAGNOSIS — Z8616 Personal history of COVID-19: Secondary | ICD-10-CM | POA: Insufficient documentation

## 2021-05-17 DIAGNOSIS — H5710 Ocular pain, unspecified eye: Secondary | ICD-10-CM | POA: Diagnosis not present

## 2021-05-17 DIAGNOSIS — R110 Nausea: Secondary | ICD-10-CM | POA: Diagnosis not present

## 2021-05-17 DIAGNOSIS — R079 Chest pain, unspecified: Secondary | ICD-10-CM | POA: Diagnosis not present

## 2021-05-17 DIAGNOSIS — R42 Dizziness and giddiness: Secondary | ICD-10-CM | POA: Diagnosis not present

## 2021-05-17 DIAGNOSIS — H547 Unspecified visual loss: Secondary | ICD-10-CM | POA: Diagnosis not present

## 2021-05-17 LAB — BASIC METABOLIC PANEL
Anion gap: 11 (ref 5–15)
BUN: 16 mg/dL (ref 6–20)
CO2: 29 mmol/L (ref 22–32)
Calcium: 9.6 mg/dL (ref 8.9–10.3)
Chloride: 97 mmol/L — ABNORMAL LOW (ref 98–111)
Creatinine, Ser: 1.19 mg/dL — ABNORMAL HIGH (ref 0.44–1.00)
GFR, Estimated: 54 mL/min — ABNORMAL LOW (ref >60)
Glucose, Bld: 96 mg/dL (ref 70–99)
Potassium: 3.2 mmol/L — ABNORMAL LOW (ref 3.5–5.1)
Sodium: 137 mmol/L (ref 135–145)

## 2021-05-17 LAB — CBC WITH DIFFERENTIAL/PLATELET
Abs Immature Granulocytes: 0.02 10*3/uL (ref 0.00–0.07)
Basophils Absolute: 0 10*3/uL (ref 0.0–0.1)
Basophils Relative: 1 %
Eosinophils Absolute: 0.2 10*3/uL (ref 0.0–0.5)
Eosinophils Relative: 3 %
HCT: 44.8 % (ref 36.0–46.0)
Hemoglobin: 14.6 g/dL (ref 12.0–15.0)
Immature Granulocytes: 0 %
Lymphocytes Relative: 57 %
Lymphs Abs: 4 10*3/uL (ref 0.7–4.0)
MCH: 30.9 pg (ref 26.0–34.0)
MCHC: 32.6 g/dL (ref 30.0–36.0)
MCV: 94.9 fL (ref 80.0–100.0)
Monocytes Absolute: 0.6 10*3/uL (ref 0.1–1.0)
Monocytes Relative: 9 %
Neutro Abs: 2.1 10*3/uL (ref 1.7–7.7)
Neutrophils Relative %: 30 %
Platelets: 163 10*3/uL (ref 150–400)
RBC: 4.72 MIL/uL (ref 3.87–5.11)
RDW: 14.3 % (ref 11.5–15.5)
WBC: 7 10*3/uL (ref 4.0–10.5)
nRBC: 0 % (ref 0.0–0.2)

## 2021-05-17 LAB — URINALYSIS, ROUTINE W REFLEX MICROSCOPIC
Bilirubin Urine: NEGATIVE
Glucose, UA: NEGATIVE mg/dL
Hgb urine dipstick: NEGATIVE
Ketones, ur: NEGATIVE mg/dL
Leukocytes,Ua: NEGATIVE
Nitrite: NEGATIVE
Protein, ur: NEGATIVE mg/dL
Specific Gravity, Urine: 1.004 — ABNORMAL LOW (ref 1.005–1.030)
pH: 5 (ref 5.0–8.0)

## 2021-05-17 LAB — TROPONIN I (HIGH SENSITIVITY)
Troponin I (High Sensitivity): 7 ng/L (ref <18)
Troponin I (High Sensitivity): 7 ng/L (ref <18)

## 2021-05-17 MED ORDER — NAPROXEN 250 MG PO TABS
500.0000 mg | ORAL_TABLET | Freq: Once | ORAL | Status: AC
  Administered 2021-05-17: 500 mg via ORAL
  Filled 2021-05-17: qty 2

## 2021-05-17 MED ORDER — TETRACAINE HCL 0.5 % OP SOLN
2.0000 [drp] | Freq: Once | OPHTHALMIC | Status: DC
  Filled 2021-05-17: qty 4

## 2021-05-17 MED ORDER — OXYCODONE HCL 5 MG PO TABS
5.0000 mg | ORAL_TABLET | Freq: Once | ORAL | Status: AC
  Administered 2021-05-17: 5 mg via ORAL
  Filled 2021-05-17: qty 1

## 2021-05-17 MED ORDER — FLUORESCEIN SODIUM 1 MG OP STRP: 1.0000 | ORAL_STRIP | Freq: Once | OPHTHALMIC | Status: DC

## 2021-05-17 MED ORDER — SODIUM CHLORIDE 0.9 % IV BOLUS
1000.0000 mL | Freq: Once | INTRAVENOUS | Status: AC
  Administered 2021-05-17: 1000 mL via INTRAVENOUS

## 2021-05-17 MED ORDER — POTASSIUM CHLORIDE CRYS ER 20 MEQ PO TBCR
40.0000 meq | EXTENDED_RELEASE_TABLET | Freq: Once | ORAL | Status: AC
  Administered 2021-05-17: 40 meq via ORAL
  Filled 2021-05-17: qty 2

## 2021-05-17 NOTE — ED Triage Notes (Signed)
Pt presents by EMS for multiple complaints including eye pain with vision loss (hx of similar however reports worsening vision loss), dizziness, felling off balance, sob, back pain. Hx of sarcoidosis  ?

## 2021-05-17 NOTE — ED Provider Triage Note (Signed)
Emergency Medicine Provider Triage Evaluation Note ? ?Julie Cruz , a 56 y.o. female  was evaluated in triage.  Pt with numerous complaints.  Eye pain with worsening vision x3 months, now feeling off balance, chest pain, SOB.  Also has back pain.  Denies fall/trauma.  Hx sarcoidosis, pulmonary HTN.  Denies fever or sick contacts.  Had covid in December. ? ?Review of Systems  ?Positive: As above ?Negative: As above ? ?Physical Exam  ?BP (!) 150/99 (BP Location: Right Arm)   Pulse 94   Temp 99.3 ?F (37.4 ?C) (Oral)   Resp (!) 25   SpO2 94%  ?Gen:   Awake, no distress   ?Resp:  Normal effort  ?MSK:   Moves extremities without difficulty  ?Other:   ? ?Medical Decision Making  ?Medically screening exam initiated at 3:20 AM.  Appropriate orders placed.  ELWYN IVERY was informed that the remainder of the evaluation will be completed by another provider, this initial triage assessment does not replace that evaluation, and the importance of remaining in the ED until their evaluation is complete. ? ?Multiple complaints-- eye pain and worsening vision x3 months, chest pain, SOB, feeling off balance, back pain.  EKG, labs, CXR, CT head. ?  ?Garlon Hatchet, PA-C ?05/17/21 0865 ? ?

## 2021-05-17 NOTE — ED Notes (Signed)
Pt's sclera is erythematous bilat. Pt has photophobia ? ?

## 2021-05-17 NOTE — Discharge Instructions (Signed)
Please follow-up with your ophthalmologist as soon as possible. ?

## 2021-05-17 NOTE — ED Provider Notes (Signed)
?[facility] ?Provider Note ? ? ?CSN: [id number] ?Arrival date & time: 05/17/21  0315 ? ?  ? ?History ?No chief complaint on file. ? ? ?Julie Cruz is a 56 y.o. female with a past medical history of sarcoidosis presenting today with a concern of blurry vision to her right eye.  Reports that she follows with ophthalmology however she needed to get in sooner and came here.  Says that for the past few years she has had troubles with her vision and eye pressure secondary to her sarcoidosis.  Reports it has been worse since she had COVID this past December. She says because her vision is nearly gone in her right eye she feels as though she is off balance.  This feeling has been present for the past few months and has not worsened acutely today.  She does not wear contact lenses, but does wear eyeglasses.  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  Ophthalmology said that there is nothing else to do on the right side however they will try to preserve function of the left optic nerve. ? ? ? ?Home Medications ?Prior to Admission medications   ?Medication Sig Start Date End Date Taking? Authorizing Provider  ?acetaminophen (TYLENOL) 650 MG CR tablet Take 650 mg by mouth every 8 (eight) hours as needed for pain.    [provider]  ?albuterol (VENTOLIN HFA) 108 (90 Base) MCG/ACT inhaler Inhale 2 puffs into the lungs every 6 (six) hours as needed for wheezing or shortness of breath. ?Patient taking differently: Inhale 2 puffs into the lungs See admin instructions. Inhale 2 puffs scheduled twice daily & inhale every 6 hours if needed for wheezing/shortness of breath 10/06/20 01/11/21  Freddi Starr, MD  ?aspirin 325 MG tablet Take 325 mg by mouth daily as needed (pain/chest tightness).    [provider]  ?brimonidine (ALPHAGAN P) 0.1 % SOLN Place 1 drop into both eyes in the morning and at bedtime.    [provider]  ?clonazePAM (KLONOPIN) 0.5 MG tablet Take 0.5 mg by mouth daily.    [provider]  ?cyclobenzaprine (FLEXERIL) 10 MG tablet Take 10-20 mg by mouth 3 (three) times daily as needed for muscle spasms.    [provider]  ?dorzolamide (TRUSOPT) 2 % ophthalmic solution  04/07/21   [provider]  ?hydrochlorothiazide (HYDRODIURIL) 50 MG tablet Take 50 mg by mouth in the morning. 06/09/20   [provider]  ?ipratropium-albuterol (DUONEB) 0.5-2.5 (3) MG/3ML SOLN Take 3 mLs by nebulization every 6 (six) hours as needed. 10/11/20   Freddi Starr, MD  ?ketorolac (TORADOL) 10 MG tablet  10/25/20   [provider]  ?lisinopril (ZESTRIL) 40 MG tablet Take 40 mg by mouth in the morning. 06/09/20   [provider]  ?LUMIGAN 0.01 % SOLN Place 1 drop into both eyes at bedtime. 05/11/21   [provider]  ?Menthol (ICY HOT) 5 % PTCH Place 1 patch onto the skin daily as needed (pain.).    [provider]  ?Menthol, Topical Analgesic, (ICY HOT EX) Apply 1 application topically 3 (three) times daily as needed (pain.).    [provider]  ?methocarbamol (ROBAXIN) 750 MG tablet Take 750 mg by mouth every 8 (eight) hours as needed for muscle spasms. PRN    [provider]  ?naproxen (NAPROSYN) 500 MG tablet Take 1,000 mg by mouth daily as  needed (pain.).    [provider]  ?oxyCODONE-acetaminophen (PERCOCET) 10-325 MG tablet Take 1 tablet by mouth 3 (three) times daily. 04/29/21   [provider]  ?pantoprazole (PROTONIX) 40 MG tablet Take 40 mg by mouth in the morning. 06/09/20   [provider]  ?potassium chloride (KLOR-CON) 10 MEQ tablet Take 20 mEq by mouth in the morning. 06/09/20   [provider]  ?prednisoLONE acetate (PRED FORTE) 1 % ophthalmic suspension Place 1 drop into both eyes See admin instructions. Instill 1 drop into right eye scheduled twice daily for inflammation/pain & instil 1  drop into left eye up to twice daily if needed for redness/inflammation/pain.    [provider]  ?sertraline (ZOLOFT) 100 MG tablet Take 200 mg by mouth in the morning. 06/09/20   [provider]  ?Tiotropium Bromide-Olodaterol (STIOLTO RESPIMAT) 2.5-2.5 MCG/ACT AERS Inhale 2 puffs into the lungs daily. 04/12/21   Freddi Starr, MD  ?traZODone (DESYREL) 50 MG tablet Take 50 mg by mouth at bedtime.    [provider]  ?verapamil (VERELAN PM) 360 MG 24 hr capsule Take 360 mg by mouth in the morning. 06/09/20   [provider]  ?   ? ?Allergies    ?Patient has no known allergies.   ? ?Review of Systems   ?Review of Systems ? ?Physical Exam ?Updated Vital Signs ?BP 108/68   Pulse 74   Temp 97.8 ?F (36.6 ?C) (Oral)   Resp 15   SpO2 95%  ?Physical Exam ?Vitals and nursing note reviewed.  ?Constitutional:   ?   Appearance: Normal appearance.  ?HENT:  ?   Head: Normocephalic and atraumatic.  ?Eyes:  ?   General: No scleral icterus. ?   Conjunctiva/sclera: Conjunctivae normal.  ?   Comments: Patient's left eye is reactive to light.  Right eye very minimally constricts to light.  Bilateral scleral erythema.  Visual fields of the left eye intact, unable to participate in right-sided visual field testing and reports everything is blurry.  ?Pulmonary:  ?   Effort: Pulmonary effort is normal. No respiratory distress.  ?Skin: ?   Findings: No rash.  ?Neurological:  ?   Mental Status: She is alert.  ?Psychiatric:     ?   Mood and Affect: Mood normal.  ? ? ?ED Results / Procedures / Treatments   ?Labs ?(all labs ordered are listed, but only abnormal results are displayed) ?Labs Reviewed  ?BASIC METABOLIC PANEL - Abnormal; Notable for the following components:  ?    Result Value  ? Potassium 3.2 (*)   ? Chloride 97 (*)   ? Creatinine, Ser 1.19 (*)   ? GFR, Estimated 54 (*)   ? All other components within normal limits  ?URINALYSIS, ROUTINE W REFLEX MICROSCOPIC - Abnormal; Notable for the  following components:  ? Color, Urine STRAW (*)   ? Specific Gravity, Urine 1.004 (*)   ? All other components within normal limits  ?CBC WITH DIFFERENTIAL/PLATELET  ?TROPONIN I (HIGH SENSITIVITY)  ?TROPONIN I (HIGH SENSITIVITY)  ? ? ?EKG ?EKG Interpretation ? ?Date/Time:  Tuesday May 17 2021 03:26:13 EDT ?Ventricular Rate:  88 ?PR Interval:  160 ?QRS Duration: 92 ?QT Interval:  346 ?QTC Calculation: 418 ?R Axis:   35 ?Text Interpretation: Sinus rhythm with occasional Premature ventricular complexes Otherwise normal ECG No previous ECGs available Confirmed by Elnora Morrison [phone]) on 05/17/2021 11:06:19 AM ? ?Radiology ?DG Chest 2 View ? ?Result Date: 05/17/2021 ?CLINICAL DATA:  Chest pain,  shortness of breath EXAM: CHEST - 2 VIEW COMPARISON:  None. FINDINGS: Lungs are clear.  No pleural effusion or pneumothorax. The heart is normal in size. Visualized osseous structures are within normal limits. IMPRESSION: Normal chest radiographs. Electronically Signed   By: Julian Hy M.D.   On: 05/17/2021 03:50  ? ?CT HEAD WO CONTRAST (5MM) ? ?Result Date: 05/17/2021 ?CLINICAL DATA:  Dizziness, nonspecific EXAM: CT HEAD WITHOUT CONTRAST TECHNIQUE: Contiguous axial images were obtained from the base of the skull through the vertex without intravenous contrast. RADIATION DOSE REDUCTION: This exam was performed according to the departmental dose-optimization program which includes automated exposure control, adjustment of the mA and/or kV according to patient size and/or use of iterative reconstruction technique. COMPARISON:  None. FINDINGS: Brain: No evidence of acute infarction, hemorrhage, hydrocephalus, extra-axial collection or mass lesion/mass effect. Partially empty sella, a nonspecific finding in isolation. Vascular: No hyperdense vessel or unexpected calcification. Skull: Normal. Negative for fracture or focal lesion. Sinuses/Orbits: No acute finding. IMPRESSION: No acute finding. Electronically Signed   By:  Jorje Guild M.D.   On: 05/17/2021 04:36   ? ?Procedures ?Procedures  ? ?Medications Ordered in ED ?Medications  ?sodium chloride 0.9 % bolus 1,000 mL (has no administration in time range)  ?tetracaine (PONTOCAINE) 0.5

## 2021-05-19 ENCOUNTER — Encounter: Payer: Self-pay | Admitting: Neurology

## 2021-05-23 DIAGNOSIS — M5459 Other low back pain: Secondary | ICD-10-CM | POA: Diagnosis not present

## 2021-05-24 DIAGNOSIS — H4042X2 Glaucoma secondary to eye inflammation, left eye, moderate stage: Secondary | ICD-10-CM | POA: Diagnosis not present

## 2021-05-24 DIAGNOSIS — D869 Sarcoidosis, unspecified: Secondary | ICD-10-CM | POA: Diagnosis not present

## 2021-05-24 DIAGNOSIS — H04123 Dry eye syndrome of bilateral lacrimal glands: Secondary | ICD-10-CM | POA: Diagnosis not present

## 2021-05-24 DIAGNOSIS — H4041X3 Glaucoma secondary to eye inflammation, right eye, severe stage: Secondary | ICD-10-CM | POA: Diagnosis not present

## 2021-05-24 DIAGNOSIS — H25813 Combined forms of age-related cataract, bilateral: Secondary | ICD-10-CM | POA: Diagnosis not present

## 2021-05-30 ENCOUNTER — Ambulatory Visit: Payer: Medicare HMO | Admitting: Pulmonary Disease

## 2021-06-01 DIAGNOSIS — J449 Chronic obstructive pulmonary disease, unspecified: Secondary | ICD-10-CM | POA: Diagnosis not present

## 2021-06-01 DIAGNOSIS — F329 Major depressive disorder, single episode, unspecified: Secondary | ICD-10-CM | POA: Diagnosis not present

## 2021-06-01 DIAGNOSIS — R2689 Other abnormalities of gait and mobility: Secondary | ICD-10-CM | POA: Diagnosis not present

## 2021-06-01 DIAGNOSIS — M797 Fibromyalgia: Secondary | ICD-10-CM | POA: Diagnosis not present

## 2021-06-01 DIAGNOSIS — D869 Sarcoidosis, unspecified: Secondary | ICD-10-CM | POA: Diagnosis not present

## 2021-06-01 DIAGNOSIS — G4730 Sleep apnea, unspecified: Secondary | ICD-10-CM | POA: Diagnosis not present

## 2021-06-01 DIAGNOSIS — F419 Anxiety disorder, unspecified: Secondary | ICD-10-CM | POA: Diagnosis not present

## 2021-06-07 ENCOUNTER — Ambulatory Visit: Payer: Medicare HMO | Admitting: Pulmonary Disease

## 2021-06-21 ENCOUNTER — Telehealth: Payer: Self-pay | Admitting: Cardiology

## 2021-06-21 NOTE — Telephone Encounter (Signed)
Pt calling to give info for previous Cardiologist. Pt states that this info is needed for her records. Please advise ? ?Dr. Bethann Punches - [facility] ?[phone] - Telephone  ?

## 2021-06-23 DIAGNOSIS — H04123 Dry eye syndrome of bilateral lacrimal glands: Secondary | ICD-10-CM | POA: Diagnosis not present

## 2021-06-23 DIAGNOSIS — D869 Sarcoidosis, unspecified: Secondary | ICD-10-CM | POA: Diagnosis not present

## 2021-06-23 DIAGNOSIS — H20023 Recurrent acute iridocyclitis, bilateral: Secondary | ICD-10-CM | POA: Diagnosis not present

## 2021-06-23 DIAGNOSIS — H4041X3 Glaucoma secondary to eye inflammation, right eye, severe stage: Secondary | ICD-10-CM | POA: Diagnosis not present

## 2021-06-23 DIAGNOSIS — R6889 Other general symptoms and signs: Secondary | ICD-10-CM | POA: Diagnosis not present

## 2021-06-23 DIAGNOSIS — H25813 Combined forms of age-related cataract, bilateral: Secondary | ICD-10-CM | POA: Diagnosis not present

## 2021-06-23 DIAGNOSIS — H4042X2 Glaucoma secondary to eye inflammation, left eye, moderate stage: Secondary | ICD-10-CM | POA: Diagnosis not present

## 2021-06-27 DIAGNOSIS — G8929 Other chronic pain: Secondary | ICD-10-CM | POA: Diagnosis not present

## 2021-06-27 DIAGNOSIS — Z6841 Body Mass Index (BMI) 40.0 and over, adult: Secondary | ICD-10-CM | POA: Diagnosis not present

## 2021-06-27 DIAGNOSIS — M25512 Pain in left shoulder: Secondary | ICD-10-CM | POA: Diagnosis not present

## 2021-06-27 DIAGNOSIS — D860 Sarcoidosis of lung: Secondary | ICD-10-CM | POA: Diagnosis not present

## 2021-06-27 DIAGNOSIS — D869 Sarcoidosis, unspecified: Secondary | ICD-10-CM | POA: Diagnosis not present

## 2021-06-27 DIAGNOSIS — M25511 Pain in right shoulder: Secondary | ICD-10-CM | POA: Diagnosis not present

## 2021-06-27 DIAGNOSIS — H209 Unspecified iridocyclitis: Secondary | ICD-10-CM | POA: Diagnosis not present

## 2021-06-27 DIAGNOSIS — M79642 Pain in left hand: Secondary | ICD-10-CM | POA: Diagnosis not present

## 2021-06-27 DIAGNOSIS — M79641 Pain in right hand: Secondary | ICD-10-CM | POA: Diagnosis not present

## 2021-07-06 NOTE — Telephone Encounter (Signed)
Our medical records team has been working on getting records for this patient since November and we have yet to receive them. Per Dr. Gershon Craneamnitz's RN, [facility] needs a new ROI form signed since it has been so long and they will be able to send over the records.  ? ?Called and spoke to patient and made her aware of the need for a new signed ROI form in order to get the records from [facility]. Patient states that she has an appointment with Dr. Sherryll BurgerShah on 5/16 and that she will swing by our office to sign the form between 11:30 and 12:00 PM. Instructed patient to ask to speak to myself or Dr. Gershon Craneamnitz's RN when she arrives to sign the form. Patient verbalized understanding and thanked me for the call.  ?

## 2021-07-12 DIAGNOSIS — R6889 Other general symptoms and signs: Secondary | ICD-10-CM | POA: Diagnosis not present

## 2021-07-12 DIAGNOSIS — H30033 Focal chorioretinal inflammation, peripheral, bilateral: Secondary | ICD-10-CM | POA: Diagnosis not present

## 2021-07-12 DIAGNOSIS — H2513 Age-related nuclear cataract, bilateral: Secondary | ICD-10-CM | POA: Diagnosis not present

## 2021-07-12 NOTE — Telephone Encounter (Signed)
Patient is following up, requesting to speak with [place], Charity fundraiser. She states she planned to come by the office today to sign release forms, but ended up going to the eye doctor and now she is unable to make it. She would like to know if it is possible to have the forms faxed directly to her and she states she can fax them back. She was unable to provide a fax number at this time. ?

## 2021-07-13 ENCOUNTER — Emergency Department (HOSPITAL_BASED_OUTPATIENT_CLINIC_OR_DEPARTMENT_OTHER)
Admission: EM | Admit: 2021-07-13 | Discharge: 2021-07-13 | Disposition: A | Discharge status: Home / Self Care | Payer: Medicare HMO | Attending: Emergency Medicine | Admitting: Emergency Medicine

## 2021-07-13 ENCOUNTER — Other Ambulatory Visit: Payer: Self-pay

## 2021-07-13 ENCOUNTER — Encounter (HOSPITAL_BASED_OUTPATIENT_CLINIC_OR_DEPARTMENT_OTHER): Payer: Self-pay

## 2021-07-13 DIAGNOSIS — R457 State of emotional shock and stress, unspecified: Secondary | ICD-10-CM | POA: Diagnosis not present

## 2021-07-13 DIAGNOSIS — R064 Hyperventilation: Secondary | ICD-10-CM | POA: Diagnosis not present

## 2021-07-13 DIAGNOSIS — R42 Dizziness and giddiness: Secondary | ICD-10-CM | POA: Diagnosis not present

## 2021-07-13 DIAGNOSIS — E876 Hypokalemia: Secondary | ICD-10-CM | POA: Diagnosis not present

## 2021-07-13 DIAGNOSIS — R55 Syncope and collapse: Secondary | ICD-10-CM

## 2021-07-13 DIAGNOSIS — W19XXXA Unspecified fall, initial encounter: Secondary | ICD-10-CM | POA: Diagnosis not present

## 2021-07-13 LAB — CBC WITH DIFFERENTIAL/PLATELET
Abs Immature Granulocytes: 0 K/uL (ref 0.00–0.07)
Basophils Absolute: 0 K/uL (ref 0.0–0.1)
Basophils Relative: 1 %
Eosinophils Absolute: 0.2 K/uL (ref 0.0–0.5)
Eosinophils Relative: 5 %
HCT: 40.2 % (ref 36.0–46.0)
Hemoglobin: 12.7 g/dL (ref 12.0–15.0)
Immature Granulocytes: 0 %
Lymphocytes Relative: 42 %
Lymphs Abs: 1.6 K/uL (ref 0.7–4.0)
MCH: 29.7 pg (ref 26.0–34.0)
MCHC: 31.6 g/dL (ref 30.0–36.0)
MCV: 94.1 fL (ref 80.0–100.0)
Monocytes Absolute: 0.5 K/uL (ref 0.1–1.0)
Monocytes Relative: 13 %
Neutro Abs: 1.5 K/uL — ABNORMAL LOW (ref 1.7–7.7)
Neutrophils Relative %: 39 %
Platelets: 152 K/uL (ref 150–400)
RBC: 4.27 MIL/uL (ref 3.87–5.11)
RDW: 14.7 % (ref 11.5–15.5)
WBC: 3.8 K/uL — ABNORMAL LOW (ref 4.0–10.5)
nRBC: 0 % (ref 0.0–0.2)

## 2021-07-13 LAB — BASIC METABOLIC PANEL WITH GFR
Anion gap: 9 (ref 5–15)
BUN: 12 mg/dL (ref 6–20)
CO2: 30 mmol/L (ref 22–32)
Calcium: 9.2 mg/dL (ref 8.9–10.3)
Chloride: 101 mmol/L (ref 98–111)
Creatinine, Ser: 0.96 mg/dL (ref 0.44–1.00)
GFR, Estimated: >60 mL/min (ref >60)
Glucose, Bld: 89 mg/dL (ref 70–99)
Potassium: 3.4 mmol/L — ABNORMAL LOW (ref 3.5–5.1)
Sodium: 140 mmol/L (ref 135–145)

## 2021-07-13 MED ORDER — POTASSIUM CHLORIDE CRYS ER 20 MEQ PO TBCR
40.0000 meq | EXTENDED_RELEASE_TABLET | Freq: Once | ORAL | Status: AC
  Administered 2021-07-13: 40 meq via ORAL
  Filled 2021-07-13: qty 2

## 2021-07-13 NOTE — ED Triage Notes (Signed)
She c/o feeling "lightheaded" whenever standing/x 2 days. She is in no distress. ?

## 2021-07-13 NOTE — Discharge Instructions (Addendum)
Call your primary care doctor or specialist as discussed in the next 2-3 days.   Return immediately back to the ER if:  Your symptoms worsen within the next 12-24 hours. You develop new symptoms such as new fevers, persistent vomiting, new pain, shortness of breath, or new weakness or numbness, or if you have any other concerns.  

## 2021-07-13 NOTE — ED Notes (Signed)
RT note: Pt. placed on 3 lpm n/c while waiting for test results, uses CPAP at home h/s and wanted to make staff aware, sats have maintained mid 90's, this RT placed on 3 lpm n/c and pt. remains on monitor. RT to monitor. ?

## 2021-07-13 NOTE — Telephone Encounter (Signed)
Returned call to the patient. She states that she had an eye appointment come up yesterday and she was not able to come by the office to sign the ROI forms that are required in order for [name] to get her records. She states that she would like for me to fax it to her so that she can sign it and fax it back. She states that she does not have the fax number on her right now, but she will call back with the information.  ?

## 2021-07-13 NOTE — ED Notes (Signed)
Patient verbalizes understanding of discharge instructions. Opportunity for questioning and answers were provided. Patient discharged from ED.  °

## 2021-07-13 NOTE — ED Provider Notes (Signed)
?[facility] ?Provider Note ? ? ?CSN: [id number] ?Arrival date & time: 07/13/21  1503 ? ?  ? ?History ? ?Chief Complaint  ?Patient presents with  ? Dizziness  ? ? ?Julie Cruz is a 56 y.o. female. ? ?Patient with history of sarcoidosis, presents ER chief complaint of episodes of lightheadedness.  She says she has had about 2 or 3 episodes in the last week.  Last episode was today around 12:40 PM.  She was using her walker and when she stood up she felt her head was "swollen," denies any headache.  Symptoms lasted about 5 to 10 minutes and then resolved.  She denies any chest pain or abdominal pain no fever no cough no vomiting or diarrhea.  Currently denies any symptoms but concerned that this happened 2-3 times this past week when she stood up. ? ? ?  ? ?Home Medications ?Prior to Admission medications   ?Medication Sig Start Date End Date Taking? Authorizing Provider  ?acetaminophen (TYLENOL) 650 MG CR tablet Take 650 mg by mouth every 8 (eight) hours as needed for pain.    [provider]  ?albuterol (VENTOLIN HFA) 108 (90 Base) MCG/ACT inhaler Inhale 2 puffs into the lungs every 6 (six) hours as needed for wheezing or shortness of breath. ?Patient taking differently: Inhale 2 puffs into the lungs See admin instructions. Inhale 2 puffs scheduled twice daily & inhale every 6 hours if needed for wheezing/shortness of breath 10/06/20 01/11/21  Martina Sinner, MD  ?aspirin 325 MG tablet Take 325 mg by mouth daily as needed (pain/chest tightness).    [provider]  ?brimonidine (ALPHAGAN P) 0.1 % SOLN Place 1 drop into both eyes in the morning and at bedtime.    [provider]  ?clonazePAM (KLONOPIN) 0.5 MG tablet Take 0.5 mg by mouth daily.    [provider]  ?cyclobenzaprine (FLEXERIL) 10 MG tablet Take 10-20 mg by mouth 3 (three) times daily as needed for muscle spasms.    [provider]  ?dorzolamide (TRUSOPT) 2 % ophthalmic  solution  04/07/21   [provider]  ?hydrochlorothiazide (HYDRODIURIL) 50 MG tablet Take 50 mg by mouth in the morning. 06/09/20   [provider]  ?ipratropium-albuterol (DUONEB) 0.5-2.5 (3) MG/3ML SOLN Take 3 mLs by nebulization every 6 (six) hours as needed. 10/11/20   Martina Sinner, MD  ?ketorolac (TORADOL) 10 MG tablet  10/25/20   [provider]  ?lisinopril (ZESTRIL) 40 MG tablet Take 40 mg by mouth in the morning. 06/09/20   [provider]  ?LUMIGAN 0.01 % SOLN Place 1 drop into both eyes at bedtime. 05/11/21   [provider]  ?Menthol (ICY HOT) 5 % PTCH Place 1 patch onto the skin daily as needed (pain.).    [provider]  ?Menthol, Topical Analgesic, (ICY HOT EX) Apply 1 application topically 3 (three) times daily as needed (pain.).    [provider]  ?methocarbamol (ROBAXIN) 750 MG tablet Take 750 mg by mouth every 8 (eight) hours as needed for muscle spasms. PRN    [provider]  ?naproxen (NAPROSYN) 500 MG tablet Take 1,000 mg by mouth daily as needed (pain.).    [provider]  ?oxyCODONE-acetaminophen (PERCOCET) 10-325 MG tablet Take 1 tablet by mouth 3 (three) times daily. 04/29/21   [provider]  ?pantoprazole (PROTONIX) 40 MG tablet Take 40 mg by mouth in the morning. 06/09/20   [provider]  ?potassium chloride (KLOR-CON)  10 MEQ tablet Take 20 mEq by mouth in the morning. 06/09/20   [provider]  ?prednisoLONE acetate (PRED FORTE) 1 % ophthalmic suspension Place 1 drop into both eyes See admin instructions. Instill 1 drop into right eye scheduled twice daily for inflammation/pain & instil 1 drop into left eye up to twice daily if needed for redness/inflammation/pain.    [provider]  ?sertraline (ZOLOFT) 100 MG tablet Take 200 mg by mouth in the morning. 06/09/20   [provider]  ?Tiotropium Bromide-Olodaterol (STIOLTO RESPIMAT) 2.5-2.5 MCG/ACT AERS Inhale  2 puffs into the lungs daily. 04/12/21   Martina Sinner, MD  ?traZODone (DESYREL) 50 MG tablet Take 50 mg by mouth at bedtime.    [provider]  ?verapamil (VERELAN PM) 360 MG 24 hr capsule Take 360 mg by mouth in the morning. 06/09/20   [provider]  ?   ? ?Allergies    ?Patient has no known allergies.   ? ?Review of Systems   ?Review of Systems  ?Constitutional:  Negative for fever.  ?HENT:  Negative for ear pain.   ?Eyes:  Negative for pain.  ?Respiratory:  Negative for cough.   ?Cardiovascular:  Negative for chest pain.  ?Gastrointestinal:  Negative for abdominal pain.  ?Genitourinary:  Negative for flank pain.  ?Musculoskeletal:  Negative for back pain.  ?Skin:  Negative for rash.  ?Neurological:  Negative for headaches.  ? ?Physical Exam ?Updated Vital Signs ?BP 109/79   Pulse 71   Temp 98.1 ?F (36.7 ?C) (Oral)   Resp 16   SpO2 91%  ?Physical Exam ?Constitutional:   ?   General: She is not in acute distress. ?   Appearance: Normal appearance.  ?HENT:  ?   Head: Normocephalic.  ?   Nose: Nose normal.  ?Eyes:  ?   Extraocular Movements: Extraocular movements intact.  ?Cardiovascular:  ?   Rate and Rhythm: Normal rate.  ?Pulmonary:  ?   Effort: Pulmonary effort is normal.  ?Abdominal:  ?   Tenderness: There is no abdominal tenderness. There is no guarding or rebound.  ?Musculoskeletal:     ?   General: Normal range of motion.  ?   Cervical back: Normal range of motion.  ?Neurological:  ?   General: No focal deficit present.  ?   Mental Status: She is alert. Mental status is at baseline.  ? ? ?ED Results / Procedures / Treatments   ?Labs ?(all labs ordered are listed, but only abnormal results are displayed) ?Labs Reviewed  ?CBC WITH DIFFERENTIAL/PLATELET - Abnormal; Notable for the following components:  ?    Result Value  ? WBC 3.8 (*)   ? Neutro Abs 1.5 (*)   ? All other components within normal limits  ?BASIC METABOLIC PANEL - Abnormal; Notable for the following components:  ?  Potassium 3.4 (*)   ? All other components within normal limits  ? ? ?EKG ?None ? ?Radiology ?No results found. ? ?Procedures ?Procedures  ? ? ?Medications Ordered in ED ?Medications  ?potassium chloride SA (KLOR-CON M) CR tablet 40 mEq (has no administration in time range)  ? ? ?ED Course/ Medical Decision Making/ A&P ?  ?                        ?Medical Decision Making ?Amount and/or Complexity of Data Reviewed ?Labs: ordered. ? ?Risk ?Prescription drug management. ? ? 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 ? ?  Cardiac monitoring shows sinus rhythm. ? ?Diagnostic studies were sent, CBC was normal.  Chemistry showed low potassium which was replaced here in the ER.  EKG shows sinus rhythm.  No ST elevations or depressions, rate is normal. ? ?Vital signs otherwise remained stable, patient continues to be asymptomatic at this time. ? ? ?She has an appointment with her doctor tomorrow which I advised her to keep. ? ? ? ? ? ? ? ? ?Final Clinical Impression(s) / ED Diagnoses ?Final diagnoses:  ?Dizziness  ? ? ?Rx / DC Orders ?ED Discharge Orders   ? ? None  ? ?  ? ? ?  ?Cheryll CockayneHong, Hortencia Martire S, MD ?07/13/21 1813 ? ?

## 2021-07-13 NOTE — Telephone Encounter (Signed)
Late entry: ? ?Daughter [place] ([place]PR on file) called at 4:43 PM and stated patient went to the ER but the patient wanted her to call about giving a fax number for her ROI form to be faxed to. Daughter states that she is going to go somewhere tomorrow where she can send and receive faxes and will provide the number to [name] tomorrow. She states that she will call back tomorrow but if she has not called by 11:00 to call her at [phone].  ?

## 2021-07-14 DIAGNOSIS — R799 Abnormal finding of blood chemistry, unspecified: Secondary | ICD-10-CM | POA: Diagnosis not present

## 2021-07-14 DIAGNOSIS — D869 Sarcoidosis, unspecified: Secondary | ICD-10-CM | POA: Diagnosis not present

## 2021-07-14 DIAGNOSIS — J449 Chronic obstructive pulmonary disease, unspecified: Secondary | ICD-10-CM | POA: Diagnosis not present

## 2021-07-14 DIAGNOSIS — F419 Anxiety disorder, unspecified: Secondary | ICD-10-CM | POA: Diagnosis not present

## 2021-07-14 DIAGNOSIS — R6889 Other general symptoms and signs: Secondary | ICD-10-CM | POA: Diagnosis not present

## 2021-07-14 DIAGNOSIS — M25569 Pain in unspecified knee: Secondary | ICD-10-CM | POA: Diagnosis not present

## 2021-07-14 DIAGNOSIS — M5459 Other low back pain: Secondary | ICD-10-CM | POA: Diagnosis not present

## 2021-07-14 DIAGNOSIS — H409 Unspecified glaucoma: Secondary | ICD-10-CM | POA: Diagnosis not present

## 2021-07-14 DIAGNOSIS — M797 Fibromyalgia: Secondary | ICD-10-CM | POA: Diagnosis not present

## 2021-07-14 DIAGNOSIS — R635 Abnormal weight gain: Secondary | ICD-10-CM | POA: Diagnosis not present

## 2021-07-14 DIAGNOSIS — G4730 Sleep apnea, unspecified: Secondary | ICD-10-CM | POA: Diagnosis not present

## 2021-07-14 NOTE — Telephone Encounter (Signed)
Spoke to patient's daughter [place] ([place]PR on file). She provided a fax # of [phone]. She states that she will have the patient sign the form and return it back to [name].  ROI form has been faxed to the number provided. Confirmation received that the fax was successful.

## 2021-07-16 ENCOUNTER — Other Ambulatory Visit: Payer: Self-pay

## 2021-07-16 ENCOUNTER — Emergency Department (HOSPITAL_COMMUNITY)
Admission: EM | Admit: 2021-07-16 | Discharge: 2021-07-16 | Disposition: A | Discharge status: Home / Self Care | Payer: Medicare HMO | Attending: Emergency Medicine | Admitting: Emergency Medicine

## 2021-07-16 ENCOUNTER — Emergency Department (HOSPITAL_COMMUNITY): Payer: Medicare HMO

## 2021-07-16 ENCOUNTER — Telehealth: Payer: Self-pay | Admitting: Pulmonary Disease

## 2021-07-16 DIAGNOSIS — I10 Essential (primary) hypertension: Secondary | ICD-10-CM | POA: Diagnosis not present

## 2021-07-16 DIAGNOSIS — R0600 Dyspnea, unspecified: Secondary | ICD-10-CM | POA: Insufficient documentation

## 2021-07-16 DIAGNOSIS — Z8616 Personal history of COVID-19: Secondary | ICD-10-CM | POA: Diagnosis not present

## 2021-07-16 DIAGNOSIS — Z79899 Other long term (current) drug therapy: Secondary | ICD-10-CM | POA: Insufficient documentation

## 2021-07-16 DIAGNOSIS — Z7982 Long term (current) use of aspirin: Secondary | ICD-10-CM | POA: Insufficient documentation

## 2021-07-16 DIAGNOSIS — Z20822 Contact with and (suspected) exposure to covid-19: Secondary | ICD-10-CM | POA: Diagnosis not present

## 2021-07-16 DIAGNOSIS — R55 Syncope and collapse: Secondary | ICD-10-CM | POA: Diagnosis not present

## 2021-07-16 DIAGNOSIS — R531 Weakness: Secondary | ICD-10-CM | POA: Diagnosis not present

## 2021-07-16 DIAGNOSIS — R42 Dizziness and giddiness: Secondary | ICD-10-CM | POA: Insufficient documentation

## 2021-07-16 LAB — BASIC METABOLIC PANEL
Anion gap: 7 (ref 5–15)
BUN: 9 mg/dL (ref 6–20)
CO2: 26 mmol/L (ref 22–32)
Calcium: 9.6 mg/dL (ref 8.9–10.3)
Chloride: 106 mmol/L (ref 98–111)
Creatinine, Ser: 1.05 mg/dL — ABNORMAL HIGH (ref 0.44–1.00)
GFR, Estimated: >60 mL/min (ref >60)
Glucose, Bld: 92 mg/dL (ref 70–99)
Potassium: 3.7 mmol/L (ref 3.5–5.1)
Sodium: 139 mmol/L (ref 135–145)

## 2021-07-16 LAB — CBC
HCT: 41.6 % (ref 36.0–46.0)
Hemoglobin: 13.7 g/dL (ref 12.0–15.0)
MCH: 30.9 pg (ref 26.0–34.0)
MCHC: 32.9 g/dL (ref 30.0–36.0)
MCV: 93.7 fL (ref 80.0–100.0)
Platelets: 165 10*3/uL (ref 150–400)
RBC: 4.44 MIL/uL (ref 3.87–5.11)
RDW: 14.5 % (ref 11.5–15.5)
WBC: 5.4 10*3/uL (ref 4.0–10.5)
nRBC: 0 % (ref 0.0–0.2)

## 2021-07-16 LAB — URINALYSIS, ROUTINE W REFLEX MICROSCOPIC
Bilirubin Urine: NEGATIVE
Glucose, UA: NEGATIVE mg/dL
Hgb urine dipstick: NEGATIVE
Ketones, ur: NEGATIVE mg/dL
Leukocytes,Ua: NEGATIVE
Nitrite: NEGATIVE
Protein, ur: NEGATIVE mg/dL
Specific Gravity, Urine: 1.015 (ref 1.005–1.030)
pH: 7 (ref 5.0–8.0)

## 2021-07-16 LAB — RESP PANEL BY RT-PCR (FLU A&B, COVID) ARPGX2
Influenza A by PCR: NEGATIVE
Influenza B by PCR: NEGATIVE
SARS Coronavirus 2 by RT PCR: NEGATIVE

## 2021-07-16 LAB — D-DIMER, QUANTITATIVE: D-Dimer, Quant: 0.37 ug/mL-FEU (ref 0.00–0.50)

## 2021-07-16 LAB — TROPONIN I (HIGH SENSITIVITY): Troponin I (High Sensitivity): 5 ng/L (ref <18)

## 2021-07-16 LAB — CBG MONITORING, ED: Glucose-Capillary: 85 mg/dL (ref 70–99)

## 2021-07-16 LAB — I-STAT BETA HCG BLOOD, ED (MC, WL, AP ONLY): I-stat hCG, quantitative: <5 m[IU]/mL (ref <5)

## 2021-07-16 MED ORDER — IOHEXOL 350 MG/ML SOLN
100.0000 mL | Freq: Once | INTRAVENOUS | Status: AC | PRN
Start: 2021-07-16 — End: 2021-07-16
  Administered 2021-07-16: 100 mL via INTRAVENOUS

## 2021-07-16 NOTE — Discharge Instructions (Addendum)
You had an extensive work-up in the emergency department today to rule out life-threatening causes of syncope, or loss of consciousness.  Thankfully, your blood tests and your scans were reassuring.  Specifically, we do not see signs of anemia, infection, heart attack, blood clots in the lungs, pneumonia, dehydration, or arrhythmias.  Your EKG and telemetry rhythms were normal.  We discussed the very small possibility that you may be still having arrhythmias at home that are causing this issue.  You would benefit from talking to your cardiologist about a heart recording device (loop recorder).  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  Bad viral infections, especially from COVID, can flareup your underlying autoimmune disease (such as sarcoidosis).  You can discuss this with your rheumatologist when you see them later this month.

## 2021-07-16 NOTE — ED Provider Notes (Signed)
[facility] EMERGENCY DEPARTMENT Provider Note   CSN: [id number] Arrival date & time: 07/16/21  1533     History  Chief Complaint  Patient presents with   Loss of Consciousness    Julie Cruz is a 56 y.o. female with history of sarcoidosis, hypertension, presented to ED with generalized weakness and lightheadedness and syncopal episodes.  The patient reports that she has experienced issues with near syncope episodes for a long time, but the acuity is significantly increased over the past week.  She says she will often have sensations like a "wave" rising from her legs up to her abdomen and then into her head, which is a pressure sensation, and then she will have difficulty breathing and feel like she needs to pass out.  She was seen in the emergency department 4 days ago on 07/13/21 for lightheadedness, had largely unremarkable BMP and CBC and discharged.  She reports that she has not been doing well the past several days continues to experience multiple of these episodes as described above, and that she contacted her pulmonology office today and was told by the triage service to come into the ER for further evaluation.  She denies history of clotting, DVT or PE.  She takes a baby aspirin daily as a preventative measure, but does not take any other blood thinners.  She states that she had COVID 6 months ago in December, and that she never "fully recovered from it" and that she is experienced chronic head congestion and pressure in her head, malaise, no other symptoms since COVID  She currently establish care with a rheumatologist here is not taking any ongoing medications for her sarcoidosis.  She moved here 2 years ago from [place] where she was receiving her prior care.  She reports that she did have a loop recorder placed several years ago in [place] but it is no longer functional.  Per office note from Azalee Course PA on 09/28/20, patient has had a cardiac  catheterization in 2019 without significant coronary disease.  Last echo Aug 2022: IMPRESSIONS     1. Left ventricular ejection fraction, by estimation, is 70 to 75%. The  left ventricle has hyperdynamic function. The left ventricle has no  regional wall motion abnormalities. There is mild left ventricular  hypertrophy. Left ventricular diastolic  parameters are consistent with Grade I diastolic dysfunction (impaired  relaxation). There is midcavity systolic obliteration.   2. Right ventricular systolic function is normal. The right ventricular  size is normal.   3. Left atrial size was mildly dilated.   4. The mitral valve is normal in structure. No evidence of mitral valve  regurgitation. No evidence of mitral stenosis.   5. The aortic valve is grossly normal. Aortic valve regurgitation is not  visualized. No aortic stenosis is present.   6. Aortic dilatation noted. There is borderline dilatation of the aortic  root, measuring 39 mm.   7. The inferior vena cava is normal in size with greater than 50%  respiratory variability, suggesting right atrial pressure of 3 mmHg.   HPI     Home Medications Prior to Admission medications   Medication Sig Start Date End Date Taking? Authorizing Provider  acetaminophen (TYLENOL) 650 MG CR tablet Take 650 mg by mouth every 8 (eight) hours as needed for pain.    [provider]  albuterol (VENTOLIN HFA) 108 (90 Base) MCG/ACT inhaler Inhale 2 puffs into the lungs every 6 (six) hours as needed for  wheezing or shortness of breath. Patient taking differently: Inhale 2 puffs into the lungs See admin instructions. Inhale 2 puffs scheduled twice daily & inhale every 6 hours if needed for wheezing/shortness of breath 10/06/20 01/11/21  Martina Sinner, MD  aspirin 325 MG tablet Take 325 mg by mouth daily as needed (pain/chest tightness).    [provider]  brimonidine (ALPHAGAN P) 0.1 % SOLN Place 1 drop into both eyes in the  morning and at bedtime.    [provider]  clonazePAM (KLONOPIN) 0.5 MG tablet Take 0.5 mg by mouth daily.    [provider]  cyclobenzaprine (FLEXERIL) 10 MG tablet Take 10-20 mg by mouth 3 (three) times daily as needed for muscle spasms.    [provider]  dorzolamide (TRUSOPT) 2 % ophthalmic solution  04/07/21   [provider]  hydrochlorothiazide (HYDRODIURIL) 50 MG tablet Take 50 mg by mouth in the morning. 06/09/20   [provider]  ipratropium-albuterol (DUONEB) 0.5-2.5 (3) MG/3ML SOLN Take 3 mLs by nebulization every 6 (six) hours as needed. 10/11/20   Martina Sinner, MD  ketorolac (TORADOL) 10 MG tablet  10/25/20   [provider]  lisinopril (ZESTRIL) 40 MG tablet Take 40 mg by mouth in the morning. 06/09/20   [provider]  LUMIGAN 0.01 % SOLN Place 1 drop into both eyes at bedtime. 05/11/21   [provider]  Menthol (ICY HOT) 5 % PTCH Place 1 patch onto the skin daily as needed (pain.).    [provider]  Menthol, Topical Analgesic, (ICY HOT EX) Apply 1 application topically 3 (three) times daily as needed (pain.).    [provider]  methocarbamol (ROBAXIN) 750 MG tablet Take 750 mg by mouth every 8 (eight) hours as needed for muscle spasms. PRN    [provider]  naproxen (NAPROSYN) 500 MG tablet Take 1,000 mg by mouth daily as needed (pain.).    [provider]  oxyCODONE-acetaminophen (PERCOCET) 10-325 MG tablet Take 1 tablet by mouth 3 (three) times daily. 04/29/21   [provider]  pantoprazole (PROTONIX) 40 MG tablet Take 40 mg by mouth in the morning. 06/09/20   [provider]  potassium chloride (KLOR-CON) 10 MEQ tablet Take 20 mEq by mouth in the morning. 06/09/20   [provider]  prednisoLONE acetate (PRED FORTE) 1 % ophthalmic suspension Place 1 drop into both eyes See admin instructions. Instill 1 drop into right eye scheduled twice  daily for inflammation/pain & instil 1 drop into left eye up to twice daily if needed for redness/inflammation/pain.    [provider]  sertraline (ZOLOFT) 100 MG tablet Take 200 mg by mouth in the morning. 06/09/20   [provider]  Tiotropium Bromide-Olodaterol (STIOLTO RESPIMAT) 2.5-2.5 MCG/ACT AERS Inhale 2 puffs into the lungs daily. 04/12/21   Martina Sinner, MD  traZODone (DESYREL) 50 MG tablet Take 50 mg by mouth at bedtime.    [provider]  verapamil (VERELAN PM) 360 MG 24 hr capsule Take 360 mg by mouth in the morning. 06/09/20   [provider]      Allergies    Patient has no known allergies.    Review of Systems   Review of Systems  Physical Exam Updated Vital Signs BP (!) 121/91   Pulse 85   Temp 97.8 F (36.6 C) (Oral)   Resp 19   Ht  (1.803 m)   Wt (!) 159.7 kg  SpO2 94%   BMI 49.09 kg/m  Physical Exam Constitutional:      General: She is not in acute distress.    Appearance: She is obese.  HENT:     Head: Normocephalic and atraumatic.  Eyes:     Conjunctiva/sclera: Conjunctivae normal.     Pupils: Pupils are equal, round, and reactive to light.  Cardiovascular:     Rate and Rhythm: Normal rate and regular rhythm.  Pulmonary:     Effort: Pulmonary effort is normal. No respiratory distress.  Abdominal:     General: There is no distension.     Tenderness: There is no abdominal tenderness.  Skin:    General: Skin is warm and dry.  Neurological:     General: No focal deficit present.     Mental Status: She is alert and oriented to person, place, and time. Mental status is at baseline.     Sensory: No sensory deficit.     Motor: No weakness.  Psychiatric:        Mood and Affect: Mood normal.        Behavior: Behavior normal.    ED Results / Procedures / Treatments   Labs (all labs ordered are listed, but only abnormal results are displayed) Labs Reviewed  BASIC METABOLIC PANEL - Abnormal; Notable for  the following components:      Result Value   Creatinine, Ser 1.05 (*)    All other components within normal limits  URINALYSIS, ROUTINE W REFLEX MICROSCOPIC - Abnormal; Notable for the following components:   APPearance HAZY (*)    All other components within normal limits  RESP PANEL BY RT-PCR (FLU A&B, COVID) ARPGX2  CBC  D-DIMER, QUANTITATIVE  CBG MONITORING, ED  I-STAT BETA HCG BLOOD, ED ([facility], [facility], [facility] ONLY)  TROPONIN I (HIGH SENSITIVITY)  TROPONIN I (HIGH SENSITIVITY)    EKG EKG Interpretation  Date/Time:  Saturday Jul 16 2021 15:40:02 EDT Ventricular Rate:  83 PR Interval:  156 QRS Duration: 90 QT Interval:  386 QTC Calculation: 453 R Axis:   55 Text Interpretation: Normal sinus rhythm Normal ECG When compared with ECG of 13-Jul-2021 16:00, PREVIOUS ECG IS PRESENT Confirmed by Alvester Chourifan, Konstance Happel [phone]([id number]) on 07/16/2021 4:03:45 PM  Radiology CT HEAD WO CONTRAST (5MM)  Result Date: 07/16/2021 CLINICAL DATA:  56 year old female with syncope. EXAM: CT HEAD WITHOUT CONTRAST TECHNIQUE: Contiguous axial images were obtained from the base of the skull through the vertex without intravenous contrast. RADIATION DOSE REDUCTION: This exam was performed according to the departmental dose-optimization program which includes automated exposure control, adjustment of the mA and/or kV according to patient size and/or use of iterative reconstruction technique. COMPARISON:  05/17/2021 head CT FINDINGS: Brain: No evidence of acute infarction, hemorrhage, hydrocephalus, extra-axial collection or mass lesion/mass effect. Vascular: Carotid atherosclerotic calcifications are noted. Skull: Normal. Negative for fracture or focal lesion. Sinuses/Orbits: No acute finding. Other: None. IMPRESSION: No evidence of acute intracranial abnormality. Electronically Signed   By: Harmon PierJeffrey  Hu M.D.   On: 07/16/2021 18:00   CT Angio Chest PE W and/or Wo Contrast  Result Date: 07/16/2021 CLINICAL DATA:  Pulmonary embolism  (PE) suspected, high prob hx of sarcoidosis, here with new recurring syncope EXAM: CT ANGIOGRAPHY CHEST WITH CONTRAST TECHNIQUE: Multidetector CT imaging of the chest was performed using the standard protocol during bolus administration of intravenous contrast. Multiplanar CT image reconstructions and MIPs were obtained to evaluate the vascular anatomy. RADIATION DOSE REDUCTION: This exam was performed according to the departmental dose-optimization  program which includes automated exposure control, adjustment of the mA and/or kV according to patient size and/or use of iterative reconstruction technique. CONTRAST:  OMNIPAQUE IOHEXOL 350 MG/ML SOLN COMPARISON:  CT 10/19/2020 FINDINGS: Cardiovascular: Satisfactory opacification of the pulmonary arteries to the segmental level. No evidence of pulmonary embolism. Pulmonary trunk measures 3.4 cm in diameter. Thoracic aorta is nonaneurysmal. Normal heart size. No pericardial effusion. Mediastinum/Nodes: Redemonstration of multiple enlarged mediastinal and bilateral hilar lymph nodes. Reference nodes include lower right paratracheal node measuring 1.9 cm short axis (series 7, image 21), 1.4 cm AP window node (series 7, image 22), and inferior right hilar node measuring 1.8 cm (series 7, image 42). Nodes are grossly stable in size when compared with the previous study, although lack of contrast on the prior limits comparison. No axillary adenopathy identified. Thyroid, trachea, and esophagus demonstrate no significant findings. Lungs/Pleura: Subtle perilymphatic nodular thickening most pronounced within the mid to upper lung fields bilaterally. Bibasilar atelectasis. Subtle mosaic attenuation suggesting small airways disease. No focal consolidation. No pleural effusion or pneumothorax. Upper Abdomen: No acute abnormality. Musculoskeletal: No chest wall abnormality. No acute or significant osseous findings. Review of the MIP images confirms the above findings.  IMPRESSION: 1. No evidence of pulmonary embolism. 2. Subtle perilymphatic nodular thickening most bilaterally with mild mosaic attenuation. Appearance can be seen in the setting of pulmonary sarcoidosis. A nonspecific viral bronchiolitis also result in this appearance. 3. Redemonstration of multiple enlarged mediastinal and bilateral hilar lymph nodes, likely related to history of sarcoidosis. 4. Mildly dilated main pulmonary trunk, which can be seen in the setting of pulmonary arterial hypertension. Electronically Signed   By: Duanne Guess D.O.   On: 07/16/2021 18:03    Procedures Procedures    Medications Ordered in ED Medications  iohexol (OMNIPAQUE) 350 MG/ML injection 100 mL (100 mLs Intravenous Contrast Given 07/16/21 1749)    ED Course/ Medical Decision Making/ A&P Clinical Course as of 07/16/21 2117  Sat Jul 16, 2021  1848 I reviewed the patient's work-up with her and had a long conversation about further steps.  My suspicion is that what she is experiencing is an exacerbation of her underlying autoimmune disease in the setting of a bad COVID viral illness several months ago, causing these "waves" of dysphoria that she experiences prior to her lightheaded episodes.  I do not see evidence of pulmonary embolism, life-threatening infection, sepsis, hypoglycemia, or ACS.  She has a rheumatology appointment coming up, as well as a pulmonology appointment.  She also sees Dr Elberta Fortis from cardiology who could consider a loop recorder as an outpatient if they deem this is necessary.  However, for the several hours she has been in the ER, her telemetry did not show any evidence of arrhythmia, and my suspicion that she is having frequent dangerous arrhythmias is quite low. [MT]  1849 We did discuss observation stay on telemetry overnight in the hospital versus discharge home, and she would prefer to go home, which I think is quite reasonable.  She was advised not to drive or operate heavy machinery,  and states she does not drive. [MT]    Clinical Course User Index [MT] Derry Kassel, Kermit Balo, MD                           Medical Decision Making Amount and/or Complexity of Data Reviewed Labs: ordered. Radiology: ordered.  Risk Prescription drug management.   This patient presents to the ED with concern  for syncope versus near syncope. This involves an extensive number of treatment options, and is a complaint that carries with it a high risk of complications and morbidity.  The differential diagnosis includes anemia versus dehydration versus arrhythmia versus pulmonary embolism versus autoimmune dysfunction versus other.  It appears that the symptoms have largely exacerbated or worsened since her COVID illness 6 months ago.  I suspect may be some component of long COVID syndrome which may be exacerbating her underlying autoimmune condition.  This is difficult to diagnose.  I did explain this to the patient.  She would like to pursue further diagnostic work-up to rule out other medical causes, which I think is reasonable, and therefore we will check metabolic panel and perform a PE work-up.  She had an echocardiogram per my review of external records performed approximately 8 months ago.  Results are posted below.  She does not have any significant coronary disease per office note report from last cardiac catheterization.  This seems unlikely to be atypical ACS.  Co-morbidities that complicate the patient evaluation: History of autoimmune conditions hypertension   External records from outside source obtained and reviewed including see history above, outpatient cardiology records and echocardiogram report  I ordered and personally interpreted labs.  The pertinent results include:  UA unremarkable, no acute anemia, no leukocytosis, glucose within normal limits.  D-dimer negative.  Troponin 5.  BMP largely unremarkable.  I ordered imaging studies including CT PE, CT head I independently  visualized and interpreted imaging which showed no evidence of acute pulmonary embolism or pneumonia, no acute stroke, stable underlying pulmonary disease and sarcoidosis I agree with the radiologist interpretation  The patient was maintained on a cardiac monitor.  I personally viewed and interpreted the cardiac monitored which showed an underlying rhythm of: Normal sinus rhythm  Per my interpretation the patient's ECG shows normal sinus rhythm no acute ischemic findings  After the interventions noted above, I reevaluated the patient and found that they have: stayed the same  Dispostion:  After consideration of the diagnostic results and the patients response to treatment, I feel that the patent would benefit from outpatient PCP follow-up.         Final Clinical Impression(s) / ED Diagnoses Final diagnoses:  Near syncope    Rx / DC Orders ED Discharge Orders     None         Terald Sleeper, MD 07/16/21 2117

## 2021-07-16 NOTE — ED Notes (Signed)
Patient transported to CT 

## 2021-07-16 NOTE — Telephone Encounter (Signed)
Weekend Call Note:  Patient called in complaining of head fullness, syncopal episodes and loss of consciousness episodes when standing up.   I instructed her to go to the [facility] ER for further evaluation and possible admission.   Melody Comas, MD [facility] Office: [phone]   See Amion for personal pager PCCM on call pager [phone] until 7pm. Please call Elink 7p-7a. [phone]

## 2021-07-16 NOTE — ED Triage Notes (Signed)
Pt reports 3 episodes of "passing out this week"  states she passed out again today   Was seen at [facility] on Wednesday but states only her blood work was checked and her PCP felt she needed a CT scan.  Pt states she starts having "cold" in her feet that travels up her body and then she loses consciousness. No falls.

## 2021-07-19 DIAGNOSIS — R2689 Other abnormalities of gait and mobility: Secondary | ICD-10-CM | POA: Diagnosis not present

## 2021-07-19 DIAGNOSIS — M25569 Pain in unspecified knee: Secondary | ICD-10-CM | POA: Diagnosis not present

## 2021-07-19 DIAGNOSIS — F419 Anxiety disorder, unspecified: Secondary | ICD-10-CM | POA: Diagnosis not present

## 2021-07-19 DIAGNOSIS — D869 Sarcoidosis, unspecified: Secondary | ICD-10-CM | POA: Diagnosis not present

## 2021-07-19 DIAGNOSIS — H409 Unspecified glaucoma: Secondary | ICD-10-CM | POA: Diagnosis not present

## 2021-07-19 DIAGNOSIS — M797 Fibromyalgia: Secondary | ICD-10-CM | POA: Diagnosis not present

## 2021-07-19 DIAGNOSIS — J449 Chronic obstructive pulmonary disease, unspecified: Secondary | ICD-10-CM | POA: Diagnosis not present

## 2021-07-19 DIAGNOSIS — R55 Syncope and collapse: Secondary | ICD-10-CM | POA: Diagnosis not present

## 2021-07-19 DIAGNOSIS — G4730 Sleep apnea, unspecified: Secondary | ICD-10-CM | POA: Diagnosis not present

## 2021-07-20 ENCOUNTER — Ambulatory Visit: Payer: Medicare HMO | Admitting: Internal Medicine

## 2021-07-21 DIAGNOSIS — H209 Unspecified iridocyclitis: Secondary | ICD-10-CM | POA: Diagnosis not present

## 2021-07-21 DIAGNOSIS — M25511 Pain in right shoulder: Secondary | ICD-10-CM | POA: Diagnosis not present

## 2021-07-21 DIAGNOSIS — G8929 Other chronic pain: Secondary | ICD-10-CM | POA: Diagnosis not present

## 2021-07-21 DIAGNOSIS — Z6841 Body Mass Index (BMI) 40.0 and over, adult: Secondary | ICD-10-CM | POA: Diagnosis not present

## 2021-07-21 DIAGNOSIS — M79642 Pain in left hand: Secondary | ICD-10-CM | POA: Diagnosis not present

## 2021-07-21 DIAGNOSIS — M79641 Pain in right hand: Secondary | ICD-10-CM | POA: Diagnosis not present

## 2021-07-21 DIAGNOSIS — R6889 Other general symptoms and signs: Secondary | ICD-10-CM | POA: Diagnosis not present

## 2021-07-21 DIAGNOSIS — D869 Sarcoidosis, unspecified: Secondary | ICD-10-CM | POA: Diagnosis not present

## 2021-07-21 DIAGNOSIS — D860 Sarcoidosis of lung: Secondary | ICD-10-CM | POA: Diagnosis not present

## 2021-07-21 DIAGNOSIS — M25512 Pain in left shoulder: Secondary | ICD-10-CM | POA: Diagnosis not present

## 2021-07-26 ENCOUNTER — Encounter: Payer: Self-pay | Admitting: Pulmonary Disease

## 2021-07-26 ENCOUNTER — Ambulatory Visit: Payer: Medicare HMO | Admitting: Pulmonary Disease

## 2021-07-26 VITALS — BP 124/86 | HR 92 | Ht 71.0 in | Wt 354.1 lb

## 2021-07-26 DIAGNOSIS — R0982 Postnasal drip: Secondary | ICD-10-CM | POA: Diagnosis not present

## 2021-07-26 DIAGNOSIS — G4734 Idiopathic sleep related nonobstructive alveolar hypoventilation: Secondary | ICD-10-CM

## 2021-07-26 DIAGNOSIS — H209 Unspecified iridocyclitis: Secondary | ICD-10-CM

## 2021-07-26 DIAGNOSIS — G4733 Obstructive sleep apnea (adult) (pediatric): Secondary | ICD-10-CM

## 2021-07-26 DIAGNOSIS — D869 Sarcoidosis, unspecified: Secondary | ICD-10-CM

## 2021-07-26 DIAGNOSIS — R6889 Other general symptoms and signs: Secondary | ICD-10-CM | POA: Diagnosis not present

## 2021-07-26 MED ORDER — IPRATROPIUM BROMIDE 0.03 % NA SOLN: 2.0000 | Freq: Two times a day (BID) | NASAL | 12 refills | Status: DC

## 2021-07-26 MED ORDER — FLUTICASONE PROPIONATE 50 MCG/ACT NA SUSP: 1.0000 | Freq: Every day | NASAL | 2 refills | Status: DC

## 2021-07-26 NOTE — Progress Notes (Signed)
f  Synopsis: Referred in August 2022 for Sarcoidosis   Subjective:   PATIENT ID: Julie Carney GENDER: female DOB: [birth year], MRN: [id number]  HPI  Chief Complaint  Patient presents with   Follow-up    F/U on bipap. States she has been using the bipap machine night. She will occasionally fall asleep after turning machine off.    Julie Cruz is a 56 year old woman, former smoker with hypertension, pulmonary hypertension, fibromyalgia, uveitis and sarcoidosis who returns to pulmonary clinic for follow up of sarcoidosis.  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   She has started bipap therapy and has been compliant based on her recent download.   She was seen in the ER 5/17 and 5/20 for dizziness and near syncope episodes. Evaluations were unrevealing and she reports no issues since those visits.   She currently has cold like symptoms with runny nose and nasal congestion.   OV 04/12/21 She has not started her symbicort inhaler as she is concerned about the side effects. She is using albuterol 2 to 4 times per day for shortness of breath, cough or wheezing. She recently had ophthalmology evaluation and they did not note inflammatory involvement related to sarcoidosis. She does have vision loss in her right eye due to retina issues and she is also being treated for glaucoma.   She was contacted by a nutritionist after last visit but this is not covered by her insurance. She has been referred to the weight loss clinis.  OV 02/24/21 She tested positive for covid 19 on 12/13 and was placed on 12 day prednisone taper on 12/21 for her symptoms of cough and dyspnea. She is much improved at this time. She is using albuterol as needed for her wheezing or dyspnea and  using it on a daily basis. She has not started the Symbicort inhaler that was sent in for her after the HRCT Chest was performed showing small airways disease.   PFTs from 10/22 show a restrictive defect with normal diffusion capacity. She is scheduled for split night sleep study 03/15/20. She has ophthalmology visit next week.   She brought in papers from [place]. [facility] where she was prescribed methotrexate for sarcoidosis, uveitis and polyarthropathy. She was seen by ophthalmology and rheumatology.   OV 10/06/20 She reports being diagnosed with sarcoidosis at age 66 via lung biopsy in 2007 at [facility] in [place].  She was initially on prednisone for treatment but then due to side effects has stopped this therapy and has been weary of using methotrexate so she has not required systemic therapy over the last few years.  She denies progressive dyspnea since deciding not to have systemic treatment of her sarcoidosis. She is currently using albuterol twice daily for shortness of breath.  She was previously on Advair but did not like this inhaler.   She has history of obstructive sleep apnea and has a CPAP machine but has not used it in months as it is one of the machines included in the recent recall.  She has gained 20 to 40 pounds over the last couple of years.  Past Medical History:  Diagnosis Date   Acute medial meniscus tear    Anxiety    Bipolar 1 disorder (HCC)    Depression    Fibromyalgia  H/O spinal cord injury    T, L ans CSpine   Hypertension    Menopause    Pulmonary hypertension (HCC)    Sarcoid, cardiac      Family History  Problem Relation Age of Onset   Hyperlipidemia Mother    Asthma Mother    Thyroid disease Mother    Depression Mother    Rheum arthritis Mother    Cancer Father    Heart attack Brother    Depression Brother    HIV/AIDS Brother      Social History   Socioeconomic History   Marital status: Single    Spouse name: Not on file   Number  of children: Not on file   Years of education: Not on file   Highest education level: Not on file  Occupational History   Not on file  Tobacco Use   Smoking status: Former    Types: Cigarettes   Smokeless tobacco: Never  Vaping Use   Vaping Use: Never used  Substance and Sexual Activity   Alcohol use: Yes    Alcohol/week: 2.0 standard drinks    Types: 2 Glasses of wine per week    Comment: Daily   Drug use: Never   Sexual activity: Not Currently  Other Topics Concern   Not on file  Social History Narrative   Not on file   Social Determinants of Health   Financial Resource Strain: Not on file  Food Insecurity: Not on file  Transportation Needs: Not on file  Physical Activity: Not on file  Stress: Not on file  Social Connections: Not on file  Intimate Partner Violence: Not on file     No Known Allergies   Outpatient Medications Prior to Visit  Medication Sig Dispense Refill   acetaminophen (TYLENOL) 650 MG CR tablet Take 650 mg by mouth every 8 (eight) hours as needed for pain.     aspirin 325 MG tablet Take 325 mg by mouth daily as needed (pain/chest tightness).     brimonidine (ALPHAGAN P) 0.1 % SOLN Place 1 drop into both eyes in the morning and at bedtime.     clonazePAM (KLONOPIN) 0.5 MG tablet Take 0.5 mg by mouth daily.     cyclobenzaprine (FLEXERIL) 10 MG tablet Take 10-20 mg by mouth 3 (three) times daily as needed for muscle spasms.     dorzolamide (TRUSOPT) 2 % ophthalmic solution      hydrochlorothiazide (HYDRODIURIL) 50 MG tablet Take 50 mg by mouth in the morning.     ipratropium-albuterol (DUONEB) 0.5-2.5 (3) MG/3ML SOLN Take 3 mLs by nebulization every 6 (six) hours as needed. 360 mL 6   ketorolac (TORADOL) 10 MG tablet      lisinopril (ZESTRIL) 40 MG tablet Take 40 mg by mouth in the morning.     LUMIGAN 0.01 % SOLN Place 1 drop into both eyes at bedtime.     Menthol (ICY HOT) 5 % PTCH Place 1 patch onto the skin daily as needed (pain.).     Menthol,  Topical Analgesic, (ICY HOT EX) Apply 1 application topically 3 (three) times daily as needed (pain.).     methocarbamol (ROBAXIN) 750 MG tablet Take 750 mg by mouth every 8 (eight) hours as needed for muscle spasms. PRN     Methotrexate Sodium (METHOTREXATE, PF,) 250 MG/10ML injection      naproxen (NAPROSYN) 500 MG tablet Take 1,000 mg by mouth daily as needed (pain.).     oxyCODONE-acetaminophen (PERCOCET) 10-325 MG tablet  Take 1 tablet by mouth 3 (three) times daily.     pantoprazole (PROTONIX) 40 MG tablet Take 40 mg by mouth in the morning.     potassium chloride (KLOR-CON) 10 MEQ tablet Take 20 mEq by mouth in the morning.     prednisoLONE acetate (PRED FORTE) 1 % ophthalmic suspension Place 1 drop into both eyes See admin instructions. Instill 1 drop into right eye scheduled twice daily for inflammation/pain & instil 1 drop into left eye up to twice daily if needed for redness/inflammation/pain.     sertraline (ZOLOFT) 100 MG tablet Take 200 mg by mouth in the morning.     traZODone (DESYREL) 50 MG tablet Take 50 mg by mouth at bedtime.     verapamil (VERELAN PM) 360 MG 24 hr capsule Take 360 mg by mouth in the morning.     Tiotropium Bromide-Olodaterol (STIOLTO RESPIMAT) 2.5-2.5 MCG/ACT AERS Inhale 2 puffs into the lungs daily. 4 g 0   albuterol (VENTOLIN HFA) 108 (90 Base) MCG/ACT inhaler Inhale 2 puffs into the lungs every 6 (six) hours as needed for wheezing or shortness of breath. (Patient taking differently: Inhale 2 puffs into the lungs See admin instructions. Inhale 2 puffs scheduled twice daily & inhale every 6 hours if needed for wheezing/shortness of breath) 18 g 3   No facility-administered medications prior to visit.    Review of Systems  Constitutional:  Negative for chills, fever, malaise/fatigue and weight loss.  HENT:  Positive for congestion. Negative for sinus pain and sore throat.   Eyes:        +changes in vision  Respiratory:  Positive for cough and shortness of  breath. Negative for hemoptysis, sputum production and wheezing.   Cardiovascular:  Negative for chest pain, palpitations, orthopnea, claudication and leg swelling.  Gastrointestinal:  Negative for abdominal pain, heartburn, nausea and vomiting.  Genitourinary: Negative.   Musculoskeletal:  Positive for back pain and joint pain. Negative for myalgias.  Skin:  Negative for rash.  Neurological:  Negative for weakness.  Endo/Heme/Allergies: Negative.   Psychiatric/Behavioral: Negative.     Objective:   Vitals:   07/26/21 1454  BP: 124/86  Pulse: 92  SpO2: 99%  Weight: (!) 354 lb 1.6 oz (160.6 kg)  Height: 5\' 11"  (1.803 m)   Physical Exam Constitutional:      General: She is not in acute distress.    Appearance: She is obese. She is not ill-appearing.  HENT:     Head: Normocephalic and atraumatic.     Nose: Nose normal.     Mouth/Throat:     Mouth: Mucous membranes are moist.  Eyes:     General: No scleral icterus.    Conjunctiva/sclera: Conjunctivae normal.  Cardiovascular:     Rate and Rhythm: Normal rate and regular rhythm.     Pulses: Normal pulses.     Heart sounds: Normal heart sounds. No murmur heard. Pulmonary:     Effort: Pulmonary effort is normal.     Breath sounds: Decreased breath sounds present. No wheezing, rhonchi or rales.  Musculoskeletal:     Right lower leg: No edema.     Left lower leg: No edema.  Skin:    General: Skin is warm and dry.  Neurological:     General: No focal deficit present.     Mental Status: She is alert.  Psychiatric:        Mood and Affect: Mood normal.        Behavior: Behavior normal.  Thought Content: Thought content normal.        Judgment: Judgment normal.    CBC    Component Value Date/Time   WBC 5.4 07/16/2021 1557   RBC 4.44 07/16/2021 1557   HGB 13.7 07/16/2021 1557   HCT 41.6 07/16/2021 1557   PLT 165 07/16/2021 1557   MCV 93.7 07/16/2021 1557   MCH 30.9 07/16/2021 1557   MCHC 32.9 07/16/2021 1557    RDW 14.5 07/16/2021 1557   LYMPHSABS 1.6 07/13/2021 1700   MONOABS 0.5 07/13/2021 1700   EOSABS 0.2 07/13/2021 1700   BASOSABS 0.0 07/13/2021 1700    Chest imaging: CTA Chest 07/16/21 1. No evidence of pulmonary embolism. 2. Subtle perilymphatic nodular thickening most bilaterally with mild mosaic attenuation. Appearance can be seen in the setting of pulmonary sarcoidosis. A nonspecific viral bronchiolitis also result in this appearance. 3. Redemonstration of multiple enlarged mediastinal and bilateral hilar lymph nodes, likely related to history of sarcoidosis. 4. Mildly dilated main pulmonary trunk, which can be seen in the setting of pulmonary arterial hypertension.  HRCT Chest 10/19/20 1. Examination of the lungs is somewhat limited by body habitus and related photopenia. Within this limitation, there is mild, bandlike bibasilar scarring and or partial atelectasis without specific findings of pulmonary parenchymal sarcoidosis or other fibrotic interstitial lung disease. 2. Numerous prominent mediastinal and hilar lymph nodes, nonspecific although potentially in keeping with nodal sarcoidosis. 3. Lobular air trapping on expiratory phase imaging, suggestive of small airways disease. 4. Enlargement of the main pulmonary artery, as can be seen in pulmonary hypertension.  MRI Cardiac 02/07/2019 - report reviewed from records from [place] Multiple conglomerated lymph nodes within the superior prevascular, right paratracheal and bilateral hilar, precarinal and subcarinal lymph nodes measuring 3.8 x 2.8cm para tracheal nodes. Bilateral multiple subpectoral and axillary lymph nodes measuring up to 1.1 x 0.8cm. New bilateral hypermetabolic diffuse groundglass and subcentimeter nodular opacities.   PFT:    Latest Ref Rng & Units 12/15/2020    3:04 PM  PFT Results  FVC-Pre L 1.83    FVC-Predicted Pre % 50    FVC-Post L 1.68    FVC-Predicted Post % 45    Pre FEV1/FVC % % 79    Post  FEV1/FCV % % 83    FEV1-Pre L 1.45    FEV1-Predicted Pre % 49    FEV1-Post L 1.39    DLCO uncorrected ml/min/mmHg 21.12    DLCO UNC% % 81    DLVA Predicted % 128    TLC L 4.68    TLC % Predicted % 76    RV % Predicted % 92    PFT 2022: Mild restrictive defect present  Echo 09/28/20: EF 70-75%. Mild LVH. Grade I diastolic dysfunction. RV systolic function is normal. RV size is normal. LA is mildly dilated.   EKG 09/15/20: Normal sinus rhythm, PR 160, QRS 94, Qtc 493  LHC 04/24/2017 - from [place] records LVEDP mildly elevated LV systolic function is normal  Assessment & Plan:   Sarcoidosis  Uveitis  OSA (obstructive sleep apnea) - Plan: Pulse oximetry, overnight  Nocturnal hypoxemia - Plan: Pulse oximetry, overnight  Post-nasal drainage - Plan: fluticasone (FLONASE) 50 MCG/ACT nasal spray, ipratropium (ATROVENT) 0.03 % nasal spray  Discussion: Julie Cruz is a 56 year old woman, former smoker with hypertension, pulmonary hypertension, fibromyalgia, uveitis and sarcoidosis who returns to pulmonary clinic for follow up of sarcoidosis.  She has history of sarcoidosis with confirmed lung biopsy from 2007 at [facility]  in [place]. She was previously treated with high dose steroids and then recommended to start methotrexate for involvement of uveitis and polyarthropathy by rheumatology. She did not start this treatment due to concerns of side effects. HRCTChest  10/19/20 shows prominent hilar and mediastinal lymph nodes but no significant parenchymal involvement of sarcoidosis. CTA Chest 07/16/21 shows similar lymph node findings in the chest and subtle perilymphatic nodular thickening bilaterally of the mid/upper lobes with mild mosaic attenuation.   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 She has been recommended to start methotrexate injections by her rheumatologist and ophthalmologist. I am in agreement with this treatment given the uveitis along with  new perilymphatic nodular thickening noted on recent CTA Chest.  She can continue albuterol inhaler as needed. Start fluticasone and ipratropium nasal spray for nasal/sinus congestion.  She is on bipap therapy for her sleep apnea and is using 2L supplemental oxygen.   She is waiting for appointment with the weight management clinic. Unable to be seen by a nutritionist due to her [employer].  Follow-up in 6 months.  Melody Comas, MD [facility] Office: [phone]   Current Outpatient Medications:    acetaminophen (TYLENOL) 650 MG CR tablet, Take 650 mg by mouth every 8 (eight) hours as needed for pain., Disp: , Rfl:    aspirin 325 MG tablet, Take 325 mg by mouth daily as needed (pain/chest tightness)., Disp: , Rfl:    brimonidine (ALPHAGAN P) 0.1 % SOLN, Place 1 drop into both eyes in the morning and at bedtime., Disp: , Rfl:    clonazePAM (KLONOPIN) 0.5 MG tablet, Take 0.5 mg by mouth daily., Disp: , Rfl:    cyclobenzaprine (FLEXERIL) 10 MG tablet, Take 10-20 mg by mouth 3 (three) times daily as needed for muscle spasms., Disp: , Rfl:    dorzolamide (TRUSOPT) 2 % ophthalmic solution, , Disp: , Rfl:    fluticasone (FLONASE) 50 MCG/ACT nasal spray, Place 1 spray into both nostrils daily., Disp: 16 g, Rfl: 2   hydrochlorothiazide (HYDRODIURIL) 50 MG tablet, Take 50 mg by mouth in the morning., Disp: , Rfl:    ipratropium (ATROVENT) 0.03 % nasal spray, Place 2 sprays into both nostrils every 12 (twelve) hours., Disp: 30 mL, Rfl: 12   ipratropium-albuterol (DUONEB) 0.5-2.5 (3) MG/3ML SOLN, Take 3 mLs by nebulization every 6 (six) hours as needed., Disp: 360 mL, Rfl: 6   ketorolac (TORADOL) 10 MG tablet, , Disp: , Rfl:    lisinopril (ZESTRIL) 40 MG tablet, Take 40 mg by mouth in the morning., Disp: , Rfl:    LUMIGAN 0.01 % SOLN, Place 1 drop into both eyes at bedtime., Disp: , Rfl:    Menthol (ICY HOT) 5 % PTCH, Place 1 patch onto the skin daily as needed  (pain.)., Disp: , Rfl:    Menthol, Topical Analgesic, (ICY HOT EX), Apply 1 application topically 3 (three) times daily as needed (pain.)., Disp: , Rfl:    methocarbamol (ROBAXIN) 750 MG tablet, Take 750 mg by mouth every 8 (eight) hours as needed for muscle spasms. PRN, Disp: , Rfl:    Methotrexate Sodium (METHOTREXATE, PF,) 250 MG/10ML injection, , Disp: , Rfl:    naproxen (NAPROSYN) 500 MG tablet, Take 1,000 mg by mouth daily as needed (pain.)., Disp: , Rfl:    oxyCODONE-acetaminophen (PERCOCET) 10-325 MG tablet, Take 1 tablet by mouth 3 (three) times daily., Disp: , Rfl:    pantoprazole (PROTONIX) 40 MG tablet, Take 40  mg by mouth in the morning., Disp: , Rfl:    potassium chloride (KLOR-CON) 10 MEQ tablet, Take 20 mEq by mouth in the morning., Disp: , Rfl:    prednisoLONE acetate (PRED FORTE) 1 % ophthalmic suspension, Place 1 drop into both eyes See admin instructions. Instill 1 drop into right eye scheduled twice daily for inflammation/pain & instil 1 drop into left eye up to twice daily if needed for redness/inflammation/pain., Disp: , Rfl:    sertraline (ZOLOFT) 100 MG tablet, Take 200 mg by mouth in the morning., Disp: , Rfl:    traZODone (DESYREL) 50 MG tablet, Take 50 mg by mouth at bedtime., Disp: , Rfl:    verapamil (VERELAN PM) 360 MG 24 hr capsule, Take 360 mg by mouth in the morning., Disp: , Rfl:    albuterol (VENTOLIN HFA) 108 (90 Base) MCG/ACT inhaler, Inhale 2 puffs into the lungs every 6 (six) hours as needed for wheezing or shortness of breath. (Patient taking differently: Inhale 2 puffs into the lungs See admin instructions. Inhale 2 puffs scheduled twice daily & inhale every 6 hours if needed for wheezing/shortness of breath), Disp: 18 g, Rfl: 3

## 2021-07-26 NOTE — Patient Instructions (Addendum)
I agree with starting methotrexate for your vision and eye inflammation.  Your Bipap usage is great. We will check an overnight oximitry test on bipap with 2L of O2.  Start fluticasone nasal spray, 1-2 sprays per day  Start ipratropium nasal spray, 2 spays per nostril twice daily.   Follow up in 6 months

## 2021-07-31 ENCOUNTER — Encounter: Payer: Self-pay | Admitting: Pulmonary Disease

## 2021-08-04 DIAGNOSIS — G4733 Obstructive sleep apnea (adult) (pediatric): Secondary | ICD-10-CM | POA: Diagnosis not present

## 2021-08-08 ENCOUNTER — Telehealth: Payer: Self-pay | Admitting: Pulmonary Disease

## 2021-08-08 NOTE — Telephone Encounter (Signed)
Received ONO results from [facility]. Will place results in Dr. Lanora Manis review folder for when he returns next week.

## 2021-08-10 ENCOUNTER — Telehealth: Payer: Self-pay | Admitting: Pulmonary Disease

## 2021-08-10 NOTE — Telephone Encounter (Signed)
Called and spoke with pt letting her know that we had received her ONO results and will give these to JD once he returned to the office. Also stated to pt that we are able to see her BIPAP readings and that it shows excellent control.   While speaking with pt, pt said that she does not believe her nasal sprays are working as pt said she has had a lot of nasal congestion. Pt also states she has been wheezing and also has been coughing. Stated that she did cough up green phlegm today 6/14.  Pt said she has used her rescue inhaler 3 times a day recently. Offered to schedule pt an appt with [facility] tomorrow 6/15 but pt said she was going to do a neb treatment to see if that would give her any relief. Pt also wanted to know if there was anything that could be recommended to further help her out. Sarah, please advise.

## 2021-08-10 NOTE — Telephone Encounter (Signed)
Julie Ngo, NP  You; [facility] Triage Pool 6 minutes ago (5:18 PM)    If she gets worse not better, she needs to see emergency care. Thanks     Julie Ngo, NP  You; [facility] Triage Pool 8 minutes ago (5:17 PM)    She needs to be seen.Sounds like it could be a flare. See if she is agreeable to an OV Friday with any available openings. [employer] and spoke with pt letting her know the info per SG and she verbalized understanding. Acute visit scheduled with [facility] Friday, 6/16. Nothing further needed.

## 2021-08-12 ENCOUNTER — Ambulatory Visit: Payer: Medicare HMO | Admitting: Nurse Practitioner

## 2021-08-15 NOTE — Telephone Encounter (Signed)
Patient called and stated that her daughter received the form, but had some issues and she was not able to get her the form. Patient states that she is going to call on Thursday with a new fax # to send the form to for her to sign and fax back to [name].

## 2021-08-18 ENCOUNTER — Telehealth: Payer: Self-pay | Admitting: Cardiology

## 2021-08-18 ENCOUNTER — Encounter: Payer: Self-pay | Admitting: Student

## 2021-08-18 NOTE — Telephone Encounter (Signed)
Pt is calling stating that [place] req fax # for medical release form. [place] this was urgent because pt's daughter is there now waiting for the fax. Needs forms signed.  Fax # for [employer] [phone]  See telephone encounter in notes.

## 2021-08-18 NOTE — Telephone Encounter (Signed)
Pt called stating she will sign and fax the forms back over tomorrow.

## 2021-08-18 NOTE — Telephone Encounter (Signed)
Form has been faxed and patient is aware.

## 2021-08-18 NOTE — Telephone Encounter (Signed)
error 

## 2021-08-22 DIAGNOSIS — M170 Bilateral primary osteoarthritis of knee: Secondary | ICD-10-CM | POA: Diagnosis not present

## 2021-08-22 DIAGNOSIS — R6889 Other general symptoms and signs: Secondary | ICD-10-CM | POA: Diagnosis not present

## 2021-08-27 NOTE — Telephone Encounter (Signed)
Please let patient know her oxygen levels are in normal range when using 2L of oxygen with her bipap at night. No further adjustments needed.  Thanks, JD

## 2021-08-29 NOTE — Telephone Encounter (Signed)
Called and spoke with patient. She verbalized understanding.   Nothing further needed.  

## 2021-09-07 NOTE — Telephone Encounter (Signed)
.  autonom

## 2021-09-19 NOTE — Telephone Encounter (Signed)
I spoke with the pt and I helped her to fill out the medical release for Dr Loraine Leriche Miller/ [facility] to send all cardiac records from 2019 on to Dr Camnitz/ Sherri... she will fax back to [phone] attn Sherri and [place].   She will provide communication information for [facility].

## 2021-09-19 NOTE — Telephone Encounter (Signed)
Patient is following up, requesting to speak with Roanna Raider, RN for assistance with completing medical records release form. She states she would prefer to speak with the nurse while filling it out to make sure everything is being done correctly.

## 2021-09-21 ENCOUNTER — Telehealth: Payer: Self-pay | Admitting: Cardiology

## 2021-09-21 NOTE — Telephone Encounter (Signed)
Pt called stating she faxed over a medical release form on Monday and wants to know if anyone received it.

## 2021-09-22 ENCOUNTER — Telehealth: Payer: Self-pay

## 2021-09-22 NOTE — Telephone Encounter (Signed)
Pt called asking if we received the medical form she faxed [name]. After checking the faxes left message for pt to resend the fax and provided fax number.

## 2021-09-23 ENCOUNTER — Telehealth: Payer: Self-pay | Admitting: Cardiology

## 2021-09-23 NOTE — Telephone Encounter (Signed)
Returned call to patient to let her know that Julie Cruz was out of office until Monday. Patient states she faxed her release of information form on Monday (7/24) and received confirmation. She would like to know if we have received needed information to schedule her loop procedure.

## 2021-09-23 NOTE — Telephone Encounter (Signed)
Patient is asking that Sherri give her a call. Upset about paperwork not being received. Please advise

## 2021-09-26 NOTE — Telephone Encounter (Signed)
Completed ROI form received via fax from patient.    ROI form faxed to Dr. Royanne Foots office at [facility]. [facility] at [phone] requesting patient's records be sent to [name]. Confirmation received that fax was successful.   Called patient and made her aware.

## 2021-09-26 NOTE — Telephone Encounter (Signed)
[place], RN spoke to pt this morning.

## 2021-10-10 ENCOUNTER — Telehealth: Payer: Self-pay | Admitting: Cardiology

## 2021-10-10 ENCOUNTER — Encounter: Payer: Self-pay | Admitting: Neurology

## 2021-10-10 ENCOUNTER — Ambulatory Visit: Payer: Medicare HMO | Admitting: Neurology

## 2021-10-10 VITALS — BP 137/91 | HR 88 | Ht 71.0 in | Wt 340.0 lb

## 2021-10-10 DIAGNOSIS — D869 Sarcoidosis, unspecified: Secondary | ICD-10-CM | POA: Diagnosis not present

## 2021-10-10 DIAGNOSIS — G8929 Other chronic pain: Secondary | ICD-10-CM

## 2021-10-10 DIAGNOSIS — R413 Other amnesia: Secondary | ICD-10-CM

## 2021-10-10 DIAGNOSIS — M542 Cervicalgia: Secondary | ICD-10-CM

## 2021-10-10 NOTE — Patient Instructions (Addendum)
MRI brain and cervical spine w/wo contrast   

## 2021-10-10 NOTE — Telephone Encounter (Signed)
Patient called wanting to speak to [place] about her loop recorder procedure, she would like to know when she can come in for her procedure.

## 2021-10-10 NOTE — Progress Notes (Signed)
[facility] Clinic Note - Initial Visit   Date: 10/10/21  Julie Cruz MRN: [id number] DOB: 1965-06-14   Dear Dr. Pecola Leisure:  Thank you for your kind referral of Julie Cruz for consultation of neuropathy. Although her history is well known to you, please allow [name] to reiterate it for the purpose of our medical record. The patient was accompanied to the clinic by self.    History of Present Illness: Julie Cruz is a 56 y.o. female with sarcoidosis with pulmonary, ocular, and cardiac involvement, fibromyalgia, hypertension, depression/anxiety, COPD, OSA, and arthritis presenting for evaluation of numbness/tingling.  She is specifically asking to be evaluated for neurosarcoidosis.  She complains of numbness/tingling, stinging involving the hands and feet.  Symptoms are constant and worse when she is laying on the right side.  She was diagnosed sarcoidosis at the age of 52.    She also reports that her spinal cord is in pain, which started after fall in 2012.  She has prior CT of the thoracic and lumbar spine which shows right foraminal disc protrusion at T11-T12 and cicumfrential disc bulge at L5-S1 causing biforaminal narrowing with impingement of L5 roots. She is doing PT and has received ESI in the past.  She is planning on seeing a spine specialist.    She has brain fog, unable to remember things.    Out-side paper records, electronic medical record, and images have been reviewed where available and summarized as: CT head 07/16/2021: negative  CT thoracic spine 01/16/2019:  T10-T11: Disc bulge with at least mild thecal sac compression T11-T12:  Disc bulge right right foraminal protrusion disc herniation with right neural foraminal narrowing.  There are small anterior endplate osteophytes in the lower thoracic spine  CT lumbar spine 01/16/2019: L3-4: Disc bulge and bilateral facet arthrosis with mild bilateral neural foraminal narrowing L4-5:  Moderate loss of disc height.  Moderate bilateral facet arthrosis.  Disc bulge with mild bilateral neural foraminal narrowing L5-S1:  Moderate loss of disc height.  Circumferential disc bulge with osseus ridging with marked right and moderate to marked left neural foraminal narrowing with impingement on the exiting L5 nerve roots.  Bilateral facet arthrosis.     Past Medical History:  Diagnosis Date   Acute medial meniscus tear    Anxiety    Bipolar 1 disorder (HCC)    Depression    Fibromyalgia    H/O spinal cord injury    T, L ans CSpine   Hypertension    Menopause    Pulmonary hypertension (HCC)    Sarcoid, cardiac     Past Surgical History:  Procedure Laterality Date   C Section     Catherization      x2   COLONOSCOPY WITH PROPOFOL N/A 10/15/2020   Procedure: COLONOSCOPY WITH PROPOFOL;  Surgeon: Jeani Hawking, MD;  Location: [facility];  Service: Endoscopy;  Laterality: N/A;   Etopic  pregnacy     LUNG BIOPSY     TUBAL LIGATION       Medications:  Outpatient Encounter Medications as of 10/10/2021  Medication Sig   acetaminophen (TYLENOL) 650 MG CR tablet Take 650 mg by mouth every 8 (eight) hours as needed for pain.   albuterol (VENTOLIN HFA) 108 (90 Base) MCG/ACT inhaler Inhale 2 puffs into the lungs every 6 (six) hours as needed for wheezing or shortness of breath. (Patient taking differently: Inhale 2 puffs into the lungs See admin instructions. Inhale 2 puffs scheduled twice daily & inhale  every 6 hours if needed for wheezing/shortness of breath)   aspirin 325 MG tablet Take 325 mg by mouth daily as needed (pain/chest tightness).   brimonidine (ALPHAGAN P) 0.1 % SOLN Place 1 drop into both eyes in the morning and at bedtime.   clonazePAM (KLONOPIN) 0.5 MG tablet Take 0.5 mg by mouth daily.   cyclobenzaprine (FLEXERIL) 10 MG tablet Take 10-20 mg by mouth 3 (three) times daily as needed for muscle spasms.   dorzolamide (TRUSOPT) 2 % ophthalmic solution    fluticasone  (FLONASE) 50 MCG/ACT nasal spray Place 1 spray into both nostrils daily.   hydrochlorothiazide (HYDRODIURIL) 50 MG tablet Take 50 mg by mouth in the morning.   ipratropium (ATROVENT) 0.03 % nasal spray Place 2 sprays into both nostrils every 12 (twelve) hours.   ipratropium-albuterol (DUONEB) 0.5-2.5 (3) MG/3ML SOLN Take 3 mLs by nebulization every 6 (six) hours as needed.   ketorolac (TORADOL) 10 MG tablet    lisinopril (ZESTRIL) 40 MG tablet Take 40 mg by mouth in the morning.   LUMIGAN 0.01 % SOLN Place 1 drop into both eyes at bedtime.   Menthol (ICY HOT) 5 % PTCH Place 1 patch onto the skin daily as needed (pain.).   Menthol, Topical Analgesic, (ICY HOT EX) Apply 1 application topically 3 (three) times daily as needed (pain.).   methocarbamol (ROBAXIN) 750 MG tablet Take 750 mg by mouth every 8 (eight) hours as needed for muscle spasms. PRN   Methotrexate Sodium (METHOTREXATE, PF,) 250 MG/10ML injection    naproxen (NAPROSYN) 500 MG tablet Take 1,000 mg by mouth daily as needed (pain.).   oxyCODONE-acetaminophen (PERCOCET) 10-325 MG tablet Take 1 tablet by mouth 3 (three) times daily.   pantoprazole (PROTONIX) 40 MG tablet Take 40 mg by mouth in the morning.   potassium chloride (KLOR-CON) 10 MEQ tablet Take 20 mEq by mouth in the morning.   prednisoLONE acetate (PRED FORTE) 1 % ophthalmic suspension Place 1 drop into both eyes See admin instructions. Instill 1 drop into right eye scheduled twice daily for inflammation/pain & instil 1 drop into left eye up to twice daily if needed for redness/inflammation/pain.   sertraline (ZOLOFT) 100 MG tablet Take 200 mg by mouth in the morning.   traZODone (DESYREL) 50 MG tablet Take 50 mg by mouth at bedtime.   verapamil (VERELAN PM) 360 MG 24 hr capsule Take 360 mg by mouth in the morning.   No facility-administered encounter medications on file as of 10/10/2021.    Allergies: No Known Allergies  Family History: Family History  Problem Relation  Age of Onset   Hyperlipidemia Mother    Asthma Mother    Thyroid disease Mother    Depression Mother    Rheum arthritis Mother    Cancer Father    Heart attack Brother    Depression Brother    HIV/AIDS Brother     Social History: Social History   Tobacco Use   Smoking status: Former    Types: Cigarettes   Smokeless tobacco: Never  Vaping Use   Vaping Use: Never used  Substance Use Topics   Alcohol use: Yes    Alcohol/week: 2.0 standard drinks of alcohol    Types: 2 Glasses of wine per week    Comment: Daily   Drug use: Never   Social History   Social History Narrative   Not on file    Vital Signs:  BP (!) 137/91   Pulse 88   Ht 5\' 11"  (  1.803 m)   Wt (!) 340 lb (154.2 kg)   SpO2 95%   BMI 47.42 kg/m   Neurological Exam: MENTAL STATUS including orientation to time, place, person, recent and remote memory, attention span and concentration, language, and fund of knowledge is intact  Speech is not dysarthric, slightly pressured at times, able to be reoriented.  CRANIAL NERVES: II:  No visual field defects.    III-IV-VI: Pupils equal round and reactive to light.  Normal conjugate, extra-ocular eye movements in all directions of gaze.  No nystagmus.  No ptosis.   V:  Normal facial sensation.    VII:  Normal facial symmetry and movements.   VIII:  Normal hearing and vestibular function.   IX-X:  Normal palatal movement.   XI:  Normal shoulder shrug and head rotation.   XII:  Normal tongue strength and range of motion, no deviation or fasciculation.  MOTOR: Motor strength is 5/5 throughout. No atrophy, fasciculations or abnormal movements.  No pronator drift.   MSRs: Reflexes are 2+/4 throughout, plantars are downgoing  SENSORY:  Normal and symmetric perception of light touch, pinprick, vibration, and proprioception.  Romberg's sign absent.   COORDINATION/GAIT: Normal finger-to- nose-finger.  Finger tapping is inconsistent.  Able to rise from a chair without using  arms.  Gait mildly wide based, stable, unassisted. Stressed and tandem gait intact   IMPRESSION: This is a 56 year-old female with sarcoidosis reporting cognitive changes and paresthesias involving the hands and feet.  Exam does not support neuropathy (distal sensation, reflexes, and strength is normal).  She would like to know whether she has neurological involvement of sarcoidosis, so I will evaluate this with MRI brain and cervical spine wwo contrast.  Regarding her chronic back pain, she should follow-up with spine specialist. Further recommendations pending results.    Thank you for allowing me to participate in patient's care.  If I can answer any additional questions, I would be pleased to do so.    Sincerely,    Alayjah Boehringer K. Allena Katz, DO

## 2021-10-12 NOTE — Telephone Encounter (Signed)
Called and made patient aware that we are still waiting on her records from [place] before a decision can be made regarding her need for a procedure. Patient states that she is going to call them to find out why they have not sent the records yet.

## 2021-10-14 ENCOUNTER — Encounter: Payer: Self-pay | Admitting: *Deleted

## 2021-10-14 NOTE — Telephone Encounter (Signed)
Pt called yesterday to speak with me. Returned her call today, as I was not in the office yesterday. Pt informs me that she spoke with [facility] yesterday and they are mailing her the records.  States she will bring them to [name] once she receives them. Thanked pt for update and will be looking forward to hearing from her soon. She appreciates my follow up call.

## 2021-10-24 ENCOUNTER — Telehealth: Payer: Self-pay | Admitting: Cardiology

## 2021-10-24 NOTE — Telephone Encounter (Signed)
   Pt is calling back, she is requesting to speak with Julie Cruz. She said, she can stop by on Friday to drop off her medical records. She would like to get a call back

## 2021-10-24 NOTE — Telephone Encounter (Signed)
Spoke with patient and she has a MRI scheduled for 9/1 at 1:30.  She will drop off her medical records after her MRI for Dory Horn, RN.  I let her know Roanna Raider is not here Friday, she then asked if [place] would be here to take the records.  She will come around 3-3:30 and ask for [place].

## 2021-10-28 ENCOUNTER — Ambulatory Visit
Admission: RE | Admit: 2021-10-28 | Discharge: 2021-10-28 | Disposition: A | Discharge status: Home / Self Care | Payer: Medicare HMO | Source: Ambulatory Visit | Attending: Neurology | Admitting: Neurology

## 2021-10-28 DIAGNOSIS — D869 Sarcoidosis, unspecified: Secondary | ICD-10-CM

## 2021-10-28 DIAGNOSIS — R413 Other amnesia: Secondary | ICD-10-CM

## 2021-10-28 DIAGNOSIS — G8929 Other chronic pain: Secondary | ICD-10-CM

## 2021-10-28 MED ORDER — GADOPICLENOL 0.5 MMOL/ML IV SOLN
10.0000 mL | Freq: Once | INTRAVENOUS | Status: AC | PRN
  Administered 2021-10-28: 10 mL via INTRAVENOUS

## 2021-11-01 NOTE — Telephone Encounter (Signed)
Records received from patient 9/1 at 4:33 PM.   Records given to Dr. Gershon Crane RN this AM.

## 2021-11-01 NOTE — Telephone Encounter (Signed)
Pt is calling requesting call back to discuss medical records that were dropped off.

## 2021-11-01 NOTE — Telephone Encounter (Signed)
Pt aware I received the records from mngr today. Aware Dr. Elberta Fortis will review as soon as he can but that it may not be this week. Aware I will be in touch once MD has looked at things. Patient verbalized understanding and agreeable to plan.

## 2021-11-03 ENCOUNTER — Telehealth: Payer: Self-pay | Admitting: Neurology

## 2021-11-03 NOTE — Telephone Encounter (Signed)
Pt called in wanting to see if we have gotten her imaging results yet?

## 2021-11-04 NOTE — Telephone Encounter (Signed)
See result note.  

## 2021-11-09 NOTE — Telephone Encounter (Signed)
Pt informed records reviewed. Appt made for 10/9 to take ILR out and discuss possible reimplantation. Did make pt aware that after records reviewed we did not see an indication for reimplanting, but will discuss at that OV and will proceed with implantation if deemed needed. Patient verbalized understanding and agreeable to plan.

## 2021-12-05 ENCOUNTER — Ambulatory Visit: Payer: Medicare HMO | Attending: Internal Medicine

## 2021-12-05 ENCOUNTER — Ambulatory Visit: Payer: Medicare HMO | Admitting: Cardiology

## 2021-12-05 VITALS — BP 142/84 | HR 85

## 2021-12-05 DIAGNOSIS — R0602 Shortness of breath: Secondary | ICD-10-CM | POA: Diagnosis not present

## 2021-12-05 DIAGNOSIS — Z79899 Other long term (current) drug therapy: Secondary | ICD-10-CM | POA: Diagnosis not present

## 2021-12-05 DIAGNOSIS — R002 Palpitations: Secondary | ICD-10-CM | POA: Diagnosis not present

## 2021-12-05 LAB — BASIC METABOLIC PANEL
BUN/Creatinine Ratio: 17 (ref 9–23)
BUN: 19 mg/dL (ref 6–24)
CO2: 26 mmol/L (ref 20–29)
Calcium: 9.4 mg/dL (ref 8.7–10.2)
Chloride: 103 mmol/L (ref 96–106)
Creatinine, Ser: 1.09 mg/dL — ABNORMAL HIGH (ref 0.57–1.00)
Glucose: 105 mg/dL — ABNORMAL HIGH (ref 70–99)
Potassium: 3.4 mmol/L — ABNORMAL LOW (ref 3.5–5.2)
Sodium: 140 mmol/L (ref 134–144)
eGFR: 60 mL/min/{1.73_m2} (ref >59)

## 2021-12-05 NOTE — Patient Instructions (Signed)
Medication Instructions:   *If you need a refill on your cardiac medications before your next appointment, please call your pharmacy*   Lab Work: BMET TODAY  If you have labs (blood work) drawn today and your tests are completely normal, you will receive your results only by: [place] (if you have MyChart) OR A paper copy in the mail If you have any lab test that is abnormal or we need to change your treatment, we will call you to review the results.   Testing/Procedures:     Follow-Up: At [facility], you and your health needs are our priority.  As part of our continuing mission to provide you with exceptional heart care, we have created designated Provider Care Teams.  These Care Teams include your primary Cardiologist (physician) and Advanced Practice Providers (APPs -  Physician Assistants and Nurse Practitioners) who all work together to provide you with the care you need, when you need it.  We recommend signing up for the patient portal called "MyChart".  Sign up information is provided on this After Visit Summary.  MyChart is used to connect with patients for Virtual Visits (Telemedicine).  Patients are able to view lab/test results, encounter notes, upcoming appointments, etc.  Non-urgent messages can be sent to your provider as well.   To learn more about what you can do with MyChart, go to [email or web address].     Important Information About Sugar

## 2021-12-05 NOTE — Progress Notes (Signed)
   Nurse Visit   Date of Encounter: 12/05/2021 ID: Julie Cruz, DOB [birth year], MRN [id number]  PCP:  Lin Landsman, [place] [facility] Cardiologist:  None      Visit Details   VS:  BP (!) 142/84 (BP Location: Right Arm, Patient Position: Sitting, Cuff Size: Large)   Pulse 85   SpO2 97%  , BMI There is no height or weight on file to calculate BMI.  Wt Readings from Last 3 Encounters:  10/10/21 (!) 340 lb (154.2 kg)  07/26/21 (!) 354 lb 1.6 oz (160.6 kg)  07/16/21 (!) 352 lb (159.7 kg)     Reason for visit: Walk in...palpitations  Performed today: Vitals, EKG, Provider consulted:the DOD Dr Ali Lowe, and Education Changes (medications, testing, etc.) : BMET since recently added a new diuretic from her Rheumatologist Length of Visit: 45 minutes    Medications Adjustments/Labs and Tests Ordered: Orders Placed This Encounter  Procedures   Basic Metabolic Panel (BMET)   EKG 12-Lead   No orders of the defined types were placed in this encounter.    Signed, Stephani Police, RN  12/05/2021 9:26 AM

## 2021-12-07 ENCOUNTER — Ambulatory Visit: Payer: Medicare HMO | Admitting: Cardiology

## 2021-12-09 ENCOUNTER — Ambulatory Visit: Payer: Medicare HMO | Admitting: Cardiology

## 2022-01-11 ENCOUNTER — Ambulatory Visit: Payer: Medicare HMO | Admitting: Cardiology

## 2022-01-26 ENCOUNTER — Encounter: Payer: Self-pay | Admitting: Cardiology

## 2022-01-26 ENCOUNTER — Ambulatory Visit (INDEPENDENT_AMBULATORY_CARE_PROVIDER_SITE_OTHER): Payer: Medicare HMO

## 2022-01-26 ENCOUNTER — Other Ambulatory Visit: Payer: Self-pay | Admitting: *Deleted

## 2022-01-26 ENCOUNTER — Ambulatory Visit: Payer: Medicare HMO | Attending: Cardiology | Admitting: Cardiology

## 2022-01-26 VITALS — BP 109/80 | HR 80 | Ht 71.0 in | Wt 336.0 lb

## 2022-01-26 DIAGNOSIS — R002 Palpitations: Secondary | ICD-10-CM

## 2022-01-26 MED ORDER — IPRATROPIUM-ALBUTEROL 0.5-2.5 (3) MG/3ML IN SOLN
3.0000 mL | Freq: Four times a day (QID) | RESPIRATORY_TRACT | 3 refills | Status: DC | PRN
Start: 2022-01-26 — End: 2023-06-26

## 2022-01-26 NOTE — Progress Notes (Signed)
Electrophysiology Office Note   Date:  01/26/2022   ID:  Julie Cruz, DOB January 26, 1966, MRN [id number]  PCP:  Julie Able, MD  Cardiologist:  Julie Cruz Primary Electrophysiologist:  Julie Ayon Jorja Loa, MD    Chief Complaint: [name] monitor   History of Present Illness: Julie Cruz is a 57 y.o. female who is being seen today for the evaluation of palpitations at the request of Julie Able, MD. Presenting today for electrophysiology evaluation.  She has a history significant for sarcoidosis with cardiac involvement, hypertension, pulmonary hypertension, bipolar 1.  She relocated from [place].  Her cardiologist was at [facility].  She had a PET scan in 2020 that showed no cardiac activity.  There was apparently mild inflammation previously, but the PET scan 02/07/2019 showed no uptake.  Left heart catheterization in 2019 was without obstructive disease.  She had an ILR implanted for an episode of syncope.  ILR reached RRT October 2022.  Today, denies symptoms of chest pain, shortness of breath, orthopnea, PND, lower extremity edema, claudication, dizziness, presyncope, syncope, bleeding, or neurologic sequela. The patient is tolerating medications without difficulties.  She has been having episodic palpitations.  Palpitations occur on a daily basis.  Palpitations occur when she stands up to walk.  They do not occur at rest.  They last for a few minutes and then go away when she sits down.  Aside from that, she has shortness of breath which is chronic.  She is followed by pulmonary medicine.  She is also planned for eye surgery.  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   Past Medical History:  Diagnosis Date   Acute medial meniscus tear    Anxiety    Bipolar 1 disorder (HCC)    Depression    Fibromyalgia    H/O spinal cord injury    T, L ans CSpine   Hypertension    Menopause    Pulmonary hypertension (HCC)    Sarcoid, cardiac    Past Surgical  History:  Procedure Laterality Date   C Section     Catherization      x2   COLONOSCOPY WITH PROPOFOL N/A 10/15/2020   Procedure: COLONOSCOPY WITH PROPOFOL;  Surgeon: Jeani Hawking, MD;  Location: [facility];  Service: Endoscopy;  Laterality: N/A;   Etopic  pregnacy     LUNG BIOPSY     TUBAL LIGATION       Current Outpatient Medications  Medication Sig Dispense Refill   acetaminophen (TYLENOL) 650 MG CR tablet Take 650 mg by mouth every 8 (eight) hours as needed for pain.     acetaZOLAMIDE (DIAMOX) 250 MG tablet Take 500 mg by mouth 2 (two) times daily.     albuterol (VENTOLIN HFA) 108 (90 Base) MCG/ACT inhaler Inhale 2 puffs into the lungs every 6 (six) hours as needed for wheezing or shortness of breath. 18 g 3   aspirin 325 MG tablet Take 325 mg by mouth daily as needed (pain/chest tightness).     brimonidine (ALPHAGAN P) 0.1 % SOLN Place 1 drop into both eyes in the morning and at bedtime.     clonazePAM (KLONOPIN) 0.5 MG tablet Take 0.5 mg by mouth daily.     cyclobenzaprine (FLEXERIL) 10 MG tablet Take 10-20 mg by mouth 3 (three) times daily as needed for muscle spasms.     dorzolamide (TRUSOPT) 2 % ophthalmic solution      folic acid (FOLVITE) 1 MG tablet Take 1 tablet by mouth  daily.     hydrochlorothiazide (HYDRODIURIL) 50 MG tablet Take 50 mg by mouth in the morning.     ipratropium-albuterol (DUONEB) 0.5-2.5 (3) MG/3ML SOLN Take 3 mLs by nebulization every 6 (six) hours as needed. 360 mL 6   lisinopril (ZESTRIL) 40 MG tablet Take 40 mg by mouth in the morning.     LUMIGAN 0.01 % SOLN Place 1 drop into both eyes at bedtime.     Menthol (ICY HOT) 5 % PTCH Place 1 patch onto the skin daily as needed (pain.).     Menthol, Topical Analgesic, (ICY HOT EX) Apply 1 application topically 3 (three) times daily as needed (pain.).     methocarbamol (ROBAXIN) 750 MG tablet Take 750 mg by mouth every 8 (eight) hours as needed for muscle spasms. PRN     Methotrexate Sodium (METHOTREXATE,  PF,) 250 MG/10ML injection      naproxen (NAPROSYN) 500 MG tablet Take 1,000 mg by mouth daily as needed (pain.).     Netarsudil Dimesylate (RHOPRESSA) 0.02 % SOLN Apply 1 drop to eye at bedtime.     oxyCODONE-acetaminophen (PERCOCET) 10-325 MG tablet Take 1 tablet by mouth 3 (three) times daily.     pantoprazole (PROTONIX) 40 MG tablet Take 40 mg by mouth in the morning.     potassium chloride (KLOR-CON) 10 MEQ tablet Take 20 mEq by mouth in the morning.     prednisoLONE acetate (PRED FORTE) 1 % ophthalmic suspension Place 1 drop into both eyes See admin instructions. Instill 1 drop into right eye scheduled twice daily for inflammation/pain & instil 1 drop into left eye up to twice daily if needed for redness/inflammation/pain.     sertraline (ZOLOFT) 100 MG tablet Take 200 mg by mouth in the morning.     traZODone (DESYREL) 50 MG tablet Take 50 mg by mouth at bedtime.     verapamil (VERELAN PM) 360 MG 24 hr capsule Take 360 mg by mouth in the morning.     No current facility-administered medications for this visit.    Allergies:   Patient has no known allergies.   Social History:  The patient  reports that she quit smoking about 16 years ago. Her smoking use included cigarettes. She has never used smokeless tobacco. She reports current alcohol use of about 2.0 standard drinks of alcohol per week. She reports that she does not use drugs.   Family History:  The patient's family history includes Asthma in her mother; Cancer in her father; Depression in her brother and mother; Diabetes in her father; HIV/AIDS in her brother; Heart attack in her brother; Hyperlipidemia in her mother; Rheum arthritis in her mother; Thyroid disease in her mother.   ROS:  Please see the history of present illness.   Otherwise, review of systems is positive for none.   All other systems are reviewed and negative.   PHYSICAL EXAM: VS:  BP 109/80 (BP Location: Right Arm, Patient Position: Sitting, Cuff Size: Normal)    Pulse 80   Ht 5\' 11"  (1.803 m)   Wt (!) 336 lb (152.4 kg)   SpO2 94%   BMI 46.86 kg/m  , BMI Body mass index is 46.86 kg/m. GEN: Well nourished, well developed, in no acute distress  HEENT: normal  Neck: no JVD, carotid bruits, or masses Cardiac: RRR; no murmurs, rubs, or gallops,no edema  Respiratory:  clear to auscultation bilaterally, normal work of breathing GI: soft, nontender, nondistended, + BS MS: no deformity or atrophy  Skin:  warm and dry, device site well healed Neuro:  Strength and sensation are intact Psych: euthymic mood, full affect  EKG:  EKG is ordered today. Personal review of the ekg ordered shows sinus rhythm, rate 86  Personal review of the device interrogation today. Results in [place]: 07/16/2021: Hemoglobin 13.7; Platelets 165 12/05/2021: BUN 19; Creatinine, Ser 1.09; Potassium 3.4; Sodium 140    Lipid Panel  No results found for: "CHOL", "TRIG", "HDL", "CHOLHDL", "VLDL", "LDLCALC", "LDLDIRECT"   Wt Readings from Last 3 Encounters:  01/26/22 (!) 336 lb (152.4 kg)  10/10/21 (!) 340 lb (154.2 kg)  07/26/21 (!) 354 lb 1.6 oz (160.6 kg)      Other studies Reviewed: Additional studies/ records that were reviewed today include: TTE 09/28/20  Review of the above records today demonstrates:   1. Left ventricular ejection fraction, by estimation, is 70 to 75%. The  left ventricle has hyperdynamic function. The left ventricle has no  regional wall motion abnormalities. There is mild left ventricular  hypertrophy. Left ventricular diastolic  parameters are consistent with Grade I diastolic dysfunction (impaired  relaxation). There is midcavity systolic obliteration.   2. Right ventricular systolic function is normal. The right ventricular  size is normal.   3. Left atrial size was mildly dilated.   4. The mitral valve is normal in structure. No evidence of mitral valve  regurgitation. No evidence of mitral stenosis.   5. The aortic valve  is grossly normal. Aortic valve regurgitation is not  visualized. No aortic stenosis is present.   6. Aortic dilatation noted. There is borderline dilatation of the aortic  root, measuring 39 mm.   7. The inferior vena cava is normal in size with greater than 50%  respiratory variability, suggesting right atrial pressure of 3 mmHg.    ASSESSMENT AND PLAN:  1.  Syncope: Status post [employer] ILR.  Battery is RRT as of October 2022.  She has had no further episodes of syncope.  As her device is RRT, she is elected to not have it explanted.  2.  Sarcoidosis: Has scheduled follow-up with pulmonary medicine  3.  Morbid obesity: Weight loss encouraged with diet and exercise. Body mass index is 46.86 kg/m.  4.  Obstructive sleep apnea: CPAP compliance encouraged  5.  Palpitations: She has palpitations on a daily basis.  They mainly occur when she stands up to exert herself.  We Batina Dougan have her wear a 2-week monitor.  Current medicines are reviewed at length with the patient today.   The patient does not have concerns regarding her medicines.  The following changes were made today: None  Labs/ tests ordered today include:  Orders Placed This Encounter  Procedures   LONG TERM MONITOR (3-14 DAYS)     Disposition:   FU pending monitor results months  Signed, Kingson Lohmeyer Meredith Leeds, MD  01/26/2022 10:51 AM     [facility] [address] [place] [address] [phone] (office) [phone] (fax)

## 2022-01-26 NOTE — Patient Instructions (Signed)
Medication Instructions:  Your physician recommends that you continue on your current medications as directed. Please refer to the Current Medication list given to you today.  *If you need a refill on your cardiac medications before your next appointment, please call your pharmacy*   Lab Work: None ordered   Testing/Procedures:                           ZIO XT- Long Term Monitor Instructions  Your physician has requested you wear a ZIO patch monitor for 14 days.  This is a single patch monitor. [employer] supplies one patch monitor per enrollment. Additional stickers are not available. Please do not apply patch if you will be having a Nuclear Stress Test,  Echocardiogram, Cardiac CT, MRI, or Chest Xray during the period you would be wearing the  monitor. The patch cannot be worn during these tests. You cannot remove and re-apply the  ZIO XT patch monitor.  Your ZIO patch monitor will be mailed 3 day USPS to your address on file. It may take 3-5 days  to receive your monitor after you have been enrolled.  Once you have received your monitor, please review the enclosed instructions. Your monitor  has already been registered assigning a specific monitor serial # to you.  Billing and Patient Assistance Program Information  We have supplied [employer] with any of your insurance information on file for billing purposes. [employer] offers a sliding scale Patient Assistance Program for patients that do not have  insurance, or whose insurance does not completely cover the cost of the ZIO monitor.  You must apply for the Patient Assistance Program to qualify for this discounted rate.  To apply, please call [employer] at [phone], select option 4, select option 2, ask to apply for  Patient Assistance Program. [employer] will ask your household income, and how many people  are in your household. They will quote your out-of-pocket cost based on that information.  [employer] will also be able to set up a  12-month, interest-free payment plan if needed.  Applying the monitor   Shave hair from upper left chest.  Hold abrader disc by orange tab. Rub abrader in 40 strokes over the upper left chest as  indicated in your monitor instructions.  Clean area with 4 enclosed alcohol pads. Let dry.  Apply patch as indicated in monitor instructions. Patch will be placed under collarbone on left  side of chest with arrow pointing upward.  Rub patch adhesive wings for 2 minutes. Remove white label marked "1". Remove the white  label marked "2". Rub patch adhesive wings for 2 additional minutes.  While looking in a mirror, press and release button in center of patch. A small green light will  flash 3-4 times. This will be your only indicator that the monitor has been turned on.  Do not shower for the first 24 hours. You may shower after the first 24 hours.  Press the button if you feel a symptom. You will hear a small click. Record Date, Time and  Symptom in the Patient Logbook.  When you are ready to remove the patch, follow instructions on the last 2 pages of Patient  Logbook. Stick patch monitor onto the last page of Patient Logbook.  Place Patient Logbook in the blue and white box. Use locking tab on box and tape box closed  securely. The blue and white box has prepaid postage on it. Please place it in the   mailbox as  soon as possible. Your physician should have your test results approximately 7 days after the  monitor has been mailed back to [employer].  Call [employer] Customer Care at [phone] if you have questions regarding  your ZIO XT patch monitor. Call them immediately if you see an orange light blinking on your  monitor.  If your monitor falls off in less than 4 days, contact our Monitor department at [phone].  If your monitor becomes loose or falls off after 4 days call [employer] at [phone] for  suggestions on securing your monitor    Follow-Up: At [facility],  you and your health needs are our priority.  As part of our continuing mission to provide you with exceptional heart care, we have created designated Provider Care Teams.  These Care Teams include your primary Cardiologist (physician) and Advanced Practice Providers (APPs -  Physician Assistants and Nurse Practitioners) who all work together to provide you with the care you need, when you need it.  Your next appointment:   To be  determined  The format for your next appointment:   In Person  Provider:   Will Camnitz, MD    Thank you for choosing [facility]!!   Jahseh Lucchese, RN [phone]  Other Instructions   Important Information About Sugar           

## 2022-01-26 NOTE — Progress Notes (Unsigned)
Applied a 14 day Zio XT monitor to patient in the office ?

## 2022-02-16 ENCOUNTER — Telehealth: Payer: Self-pay | Admitting: Cardiology

## 2022-02-16 DIAGNOSIS — D869 Sarcoidosis, unspecified: Secondary | ICD-10-CM

## 2022-02-16 NOTE — Telephone Encounter (Signed)
Pt called and would like to know if her monitor results have come in?      Best number [phone]  Patient c/o Palpitations:  High priority if patient c/o lightheadedness, shortness of breath, or chest pain  How long have you had palpitations/irregular HR/ Afib? Are you having the symptoms now? Rapid heart rate last night  Are you currently experiencing lightheadedness, SOB or CP? Pt has SOB everyday.   Do you have a history of afib (atrial fibrillation) or irregular heart rhythm? Yes      Have you checked your BP or HR? (document readings if available): she stated her BP is fine   Are you experiencing any other symptoms?   She  could hear it beating in her ear/ feeling tired   Pt would like to know if she need to see a GEN CARD?

## 2022-02-16 NOTE — Telephone Encounter (Signed)
Pt aware monitor just came back and sent to MD for review just now. Aware it will be next week before someone calls her with this result, but that from what I can see the monitor did not show anything concerning. Aware I will discuss need for general cardiologist (which pt is requesting) d/t hx mild heart attack and sarcoidosis. Patient verbalized understanding and agreeable to plan.

## 2022-03-08 HISTORY — PX: EYE SURGERY: SHX253

## 2022-03-08 NOTE — Telephone Encounter (Signed)
Pt aware referral to general cardiology being placed. Aware office will call to arrange consult. Pt appreciates our help  with this.

## 2022-03-08 NOTE — Addendum Note (Signed)
Addended by: Stanton Kidney on: 03/08/2022 05:29 PM   Modules accepted: Orders

## 2022-04-20 ENCOUNTER — Ambulatory Visit: Payer: Medicare HMO | Admitting: Interventional Cardiology

## 2022-04-24 ENCOUNTER — Emergency Department (HOSPITAL_COMMUNITY): Payer: Medicare HMO

## 2022-04-24 ENCOUNTER — Other Ambulatory Visit: Payer: Self-pay

## 2022-04-24 ENCOUNTER — Inpatient Hospital Stay (HOSPITAL_COMMUNITY)
Admission: EM | Admit: 2022-04-24 | Discharge: 2022-04-26 | DRG: 175 | Disposition: A | Discharge status: Home / Self Care | Payer: Medicare HMO | Attending: Internal Medicine | Admitting: Internal Medicine

## 2022-04-24 ENCOUNTER — Other Ambulatory Visit (HOSPITAL_COMMUNITY): Payer: Self-pay

## 2022-04-24 ENCOUNTER — Encounter (HOSPITAL_COMMUNITY): Payer: Self-pay

## 2022-04-24 DIAGNOSIS — I82409 Acute embolism and thrombosis of unspecified deep veins of unspecified lower extremity: Secondary | ICD-10-CM

## 2022-04-24 DIAGNOSIS — R9431 Abnormal electrocardiogram [ECG] [EKG]: Secondary | ICD-10-CM | POA: Diagnosis present

## 2022-04-24 DIAGNOSIS — I2699 Other pulmonary embolism without acute cor pulmonale: Principal | ICD-10-CM | POA: Diagnosis present

## 2022-04-24 DIAGNOSIS — F419 Anxiety disorder, unspecified: Secondary | ICD-10-CM | POA: Diagnosis present

## 2022-04-24 DIAGNOSIS — Z825 Family history of asthma and other chronic lower respiratory diseases: Secondary | ICD-10-CM

## 2022-04-24 DIAGNOSIS — Z79899 Other long term (current) drug therapy: Secondary | ICD-10-CM

## 2022-04-24 DIAGNOSIS — F319 Bipolar disorder, unspecified: Secondary | ICD-10-CM | POA: Diagnosis present

## 2022-04-24 DIAGNOSIS — Z1152 Encounter for screening for COVID-19: Secondary | ICD-10-CM

## 2022-04-24 DIAGNOSIS — H209 Unspecified iridocyclitis: Secondary | ICD-10-CM

## 2022-04-24 DIAGNOSIS — Z8249 Family history of ischemic heart disease and other diseases of the circulatory system: Secondary | ICD-10-CM

## 2022-04-24 DIAGNOSIS — Z83438 Family history of other disorder of lipoprotein metabolism and other lipidemia: Secondary | ICD-10-CM

## 2022-04-24 DIAGNOSIS — Z7982 Long term (current) use of aspirin: Secondary | ICD-10-CM

## 2022-04-24 DIAGNOSIS — I358 Other nonrheumatic aortic valve disorders: Secondary | ICD-10-CM | POA: Diagnosis present

## 2022-04-24 DIAGNOSIS — D8685 Sarcoid myocarditis: Secondary | ICD-10-CM | POA: Diagnosis present

## 2022-04-24 DIAGNOSIS — J9601 Acute respiratory failure with hypoxia: Principal | ICD-10-CM | POA: Diagnosis present

## 2022-04-24 DIAGNOSIS — I82442 Acute embolism and thrombosis of left tibial vein: Secondary | ICD-10-CM | POA: Diagnosis present

## 2022-04-24 DIAGNOSIS — G4733 Obstructive sleep apnea (adult) (pediatric): Secondary | ICD-10-CM | POA: Diagnosis present

## 2022-04-24 DIAGNOSIS — E876 Hypokalemia: Secondary | ICD-10-CM | POA: Diagnosis present

## 2022-04-24 DIAGNOSIS — F411 Generalized anxiety disorder: Secondary | ICD-10-CM | POA: Diagnosis present

## 2022-04-24 DIAGNOSIS — G894 Chronic pain syndrome: Secondary | ICD-10-CM | POA: Diagnosis present

## 2022-04-24 DIAGNOSIS — H409 Unspecified glaucoma: Secondary | ICD-10-CM | POA: Diagnosis present

## 2022-04-24 DIAGNOSIS — D869 Sarcoidosis, unspecified: Secondary | ICD-10-CM | POA: Diagnosis present

## 2022-04-24 DIAGNOSIS — Z8261 Family history of arthritis: Secondary | ICD-10-CM

## 2022-04-24 DIAGNOSIS — I272 Pulmonary hypertension, unspecified: Secondary | ICD-10-CM | POA: Diagnosis present

## 2022-04-24 DIAGNOSIS — Z8349 Family history of other endocrine, nutritional and metabolic diseases: Secondary | ICD-10-CM

## 2022-04-24 DIAGNOSIS — H4041X0 Glaucoma secondary to eye inflammation, right eye, stage unspecified: Secondary | ICD-10-CM

## 2022-04-24 DIAGNOSIS — I10 Essential (primary) hypertension: Secondary | ICD-10-CM | POA: Diagnosis present

## 2022-04-24 DIAGNOSIS — Z833 Family history of diabetes mellitus: Secondary | ICD-10-CM

## 2022-04-24 DIAGNOSIS — K219 Gastro-esophageal reflux disease without esophagitis: Secondary | ICD-10-CM | POA: Diagnosis present

## 2022-04-24 DIAGNOSIS — Z800 Family history of malignant neoplasm of digestive organs: Secondary | ICD-10-CM

## 2022-04-24 DIAGNOSIS — M797 Fibromyalgia: Secondary | ICD-10-CM | POA: Diagnosis present

## 2022-04-24 DIAGNOSIS — Z87891 Personal history of nicotine dependence: Secondary | ICD-10-CM

## 2022-04-24 DIAGNOSIS — R7989 Other specified abnormal findings of blood chemistry: Secondary | ICD-10-CM | POA: Diagnosis present

## 2022-04-24 DIAGNOSIS — Z818 Family history of other mental and behavioral disorders: Secondary | ICD-10-CM

## 2022-04-24 DIAGNOSIS — Z830 Family history of human immunodeficiency virus [HIV] disease: Secondary | ICD-10-CM

## 2022-04-24 DIAGNOSIS — Z6841 Body Mass Index (BMI) 40.0 and over, adult: Secondary | ICD-10-CM

## 2022-04-24 LAB — CBC WITH DIFFERENTIAL/PLATELET
Abs Immature Granulocytes: 0.02 10*3/uL (ref 0.00–0.07)
Basophils Absolute: 0 10*3/uL (ref 0.0–0.1)
Basophils Relative: 1 %
Eosinophils Absolute: 0.2 10*3/uL (ref 0.0–0.5)
Eosinophils Relative: 3 %
HCT: 37.4 % (ref 36.0–46.0)
Hemoglobin: 12.5 g/dL (ref 12.0–15.0)
Immature Granulocytes: 0 %
Lymphocytes Relative: 41 %
Lymphs Abs: 2.1 10*3/uL (ref 0.7–4.0)
MCH: 33.6 pg (ref 26.0–34.0)
MCHC: 33.4 g/dL (ref 30.0–36.0)
MCV: 100.5 fL — ABNORMAL HIGH (ref 80.0–100.0)
Monocytes Absolute: 0.4 10*3/uL (ref 0.1–1.0)
Monocytes Relative: 8 %
Neutro Abs: 2.4 10*3/uL (ref 1.7–7.7)
Neutrophils Relative %: 47 %
Platelets: 147 10*3/uL — ABNORMAL LOW (ref 150–400)
RBC: 3.72 MIL/uL — ABNORMAL LOW (ref 3.87–5.11)
RDW: 15.7 % — ABNORMAL HIGH (ref 11.5–15.5)
WBC: 5.1 10*3/uL (ref 4.0–10.5)
nRBC: 2.7 % — ABNORMAL HIGH (ref 0.0–0.2)

## 2022-04-24 LAB — D-DIMER, QUANTITATIVE: D-Dimer, Quant: 10 ug/mL-FEU — ABNORMAL HIGH (ref 0.00–0.50)

## 2022-04-24 LAB — CBG MONITORING, ED: Glucose-Capillary: 101 mg/dL — ABNORMAL HIGH (ref 70–99)

## 2022-04-24 LAB — RESP PANEL BY RT-PCR (RSV, FLU A&B, COVID)  RVPGX2
Influenza A by PCR: NEGATIVE
Influenza B by PCR: NEGATIVE
Resp Syncytial Virus by PCR: NEGATIVE
SARS Coronavirus 2 by RT PCR: NEGATIVE

## 2022-04-24 LAB — BASIC METABOLIC PANEL
Anion gap: 14 (ref 5–15)
BUN: 7 mg/dL (ref 6–20)
CO2: 26 mmol/L (ref 22–32)
Calcium: 9.5 mg/dL (ref 8.9–10.3)
Chloride: 101 mmol/L (ref 98–111)
Creatinine, Ser: 1.05 mg/dL — ABNORMAL HIGH (ref 0.44–1.00)
GFR, Estimated: >60 mL/min (ref >60)
Glucose, Bld: 116 mg/dL — ABNORMAL HIGH (ref 70–99)
Potassium: 3.1 mmol/L — ABNORMAL LOW (ref 3.5–5.1)
Sodium: 141 mmol/L (ref 135–145)

## 2022-04-24 LAB — TROPONIN I (HIGH SENSITIVITY)
Troponin I (High Sensitivity): 27 ng/L — ABNORMAL HIGH (ref <18)
Troponin I (High Sensitivity): 46 ng/L — ABNORMAL HIGH (ref <18)

## 2022-04-24 LAB — BRAIN NATRIURETIC PEPTIDE: B Natriuretic Peptide: 314.2 pg/mL — ABNORMAL HIGH (ref 0.0–100.0)

## 2022-04-24 MED ORDER — OXYCODONE-ACETAMINOPHEN 5-325 MG PO TABS
1.0000 | ORAL_TABLET | Freq: Once | ORAL | Status: AC
  Administered 2022-04-24: 1 via ORAL
  Filled 2022-04-24: qty 1

## 2022-04-24 MED ORDER — ASPIRIN 81 MG PO CHEW
324.0000 mg | CHEWABLE_TABLET | Freq: Once | ORAL | Status: AC
  Administered 2022-04-24: 324 mg via ORAL
  Filled 2022-04-24: qty 4

## 2022-04-24 MED ORDER — POTASSIUM CHLORIDE CRYS ER 20 MEQ PO TBCR
40.0000 meq | EXTENDED_RELEASE_TABLET | Freq: Once | ORAL | Status: AC
  Administered 2022-04-24: 40 meq via ORAL
  Filled 2022-04-24: qty 2

## 2022-04-24 MED ORDER — NAPROXEN 250 MG PO TABS
500.0000 mg | ORAL_TABLET | Freq: Once | ORAL | Status: AC
  Administered 2022-04-24: 500 mg via ORAL
  Filled 2022-04-24: qty 2

## 2022-04-24 MED ORDER — OXYCODONE HCL 5 MG PO TABS
5.0000 mg | ORAL_TABLET | Freq: Once | ORAL | Status: DC
  Filled 2022-04-24: qty 1

## 2022-04-24 NOTE — ED Notes (Signed)
Patient being transported to CT

## 2022-04-24 NOTE — ED Provider Notes (Signed)
11:47 PM Assumed care from Dr. Regenia Skeeter, please see their note for full history, physical and decision making until this point. In brief this is a 57 y.o. year old female who presented to the ED tonight with Shortness of Breath     Progressively dyspnea with rising troponins, elevated BNP, elevated D dimer. Possibly mild CHF? OSA? Hypoxic. Getting ct for pe then admit.   Discharge instructions, including strict return precautions for new or worsening symptoms, given. Patient and/or family verbalized understanding and agreement with the plan as described.   Labs, studies and imaging reviewed by myself and considered in medical decision making if ordered. Imaging interpreted by radiology.  Labs Reviewed  BASIC METABOLIC PANEL - Abnormal; Notable for the following components:      Result Value   Potassium 3.1 (*)    Glucose, Bld 116 (*)    Creatinine, Ser 1.05 (*)    All other components within normal limits  CBC WITH DIFFERENTIAL/PLATELET - Abnormal; Notable for the following components:   RBC 3.72 (*)    MCV 100.5 (*)    RDW 15.7 (*)    Platelets 147 (*)    nRBC 2.7 (*)    All other components within normal limits  BRAIN NATRIURETIC PEPTIDE - Abnormal; Notable for the following components:   B Natriuretic Peptide 314.2 (*)    All other components within normal limits  D-DIMER, QUANTITATIVE - Abnormal; Notable for the following components:   D-Dimer, Quant 10.00 (*)    All other components within normal limits  CBG MONITORING, ED - Abnormal; Notable for the following components:   Glucose-Capillary 101 (*)    All other components within normal limits  TROPONIN I (HIGH SENSITIVITY) - Abnormal; Notable for the following components:   Troponin I (High Sensitivity) 27 (*)    All other components within normal limits  TROPONIN I (HIGH SENSITIVITY) - Abnormal; Notable for the following components:   Troponin I (High Sensitivity) 46 (*)    All other components within normal limits  RESP  PANEL BY RT-PCR (RSV, FLU A&B, COVID)  RVPGX2    DG Chest 2 View  Final Result    CT Angio Chest PE W and/or Wo Contrast    (Results Pending)    No follow-ups on file.

## 2022-04-24 NOTE — ED Provider Notes (Signed)
[facility] Provider Note   CSN: [id number] Arrival date & time: 04/24/22  1639     History  Chief Complaint  Patient presents with   Shortness of Breath    Julie Cruz is a 57 y.o. female.  HPI 57 year old female presents with dyspnea. It has been progressively worsening for 2+ weeks. She has had some intermittent sharp chest pain. The dyspnea is present at rest and especially with minimal exertion.  She thought she had some leg swelling a few days ago that is resolved.  She wears oxygen at night but when she presented via EMS her O2 sats were in the 80s.  They put her on 2 L.  She had eye surgery where she had to be put to sleep a little over a month ago.  She has multiple comorbidities which include bipolar disorder, previous spinal cord injury, pulmonary hypertension, and sarcoidosis.  Home Medications Prior to Admission medications   Medication Sig Start Date End Date Taking? Authorizing Provider  acetaminophen (TYLENOL) 650 MG CR tablet Take 650 mg by mouth every 8 (eight) hours as needed for pain.    [provider]  acetaZOLAMIDE (DIAMOX) 250 MG tablet Take 500 mg by mouth 2 (two) times daily.    [provider]  albuterol (VENTOLIN HFA) 108 (90 Base) MCG/ACT inhaler Inhale 2 puffs into the lungs every 6 (six) hours as needed for wheezing or shortness of breath. 10/06/20 01/26/22  Freddi Starr, MD  aspirin 325 MG tablet Take 325 mg by mouth daily as needed (pain/chest tightness).    [provider]  brimonidine (ALPHAGAN P) 0.1 % SOLN Place 1 drop into both eyes in the morning and at bedtime.    [provider]  clonazePAM (KLONOPIN) 0.5 MG tablet Take 0.5 mg by mouth daily.    [provider]  cyclobenzaprine (FLEXERIL) 10 MG tablet Take 10-20 mg by mouth 3 (three) times daily as needed for muscle spasms.    [provider]  dorzolamide (TRUSOPT) 2 % ophthalmic  solution  04/07/21   [provider]  folic acid (FOLVITE) 1 MG tablet Take 1 tablet by mouth daily. 01/03/22   [provider]  hydrochlorothiazide (HYDRODIURIL) 50 MG tablet Take 50 mg by mouth in the morning. 06/09/20   [provider]  ipratropium-albuterol (DUONEB) 0.5-2.5 (3) MG/3ML SOLN Take 3 mLs by nebulization every 6 (six) hours as needed. 01/26/22   Freddi Starr, MD  lisinopril (ZESTRIL) 40 MG tablet Take 40 mg by mouth in the morning. 06/09/20   [provider]  LUMIGAN 0.01 % SOLN Place 1 drop into both eyes at bedtime. 05/11/21   [provider]  Menthol (ICY HOT) 5 % PTCH Place 1 patch onto the skin daily as needed (pain.).    [provider]  Menthol, Topical Analgesic, (ICY HOT EX) Apply 1 application topically 3 (three) times daily as needed (pain.).    [provider]  methocarbamol (ROBAXIN) 750 MG tablet Take 750 mg by mouth every 8 (eight) hours as needed for muscle spasms. PRN    [provider]  Methotrexate Sodium (METHOTREXATE, PF,) 250 MG/10ML injection  07/21/21   [provider]  naproxen (NAPROSYN) 500 MG tablet Take 1,000 mg by mouth daily as needed (pain.).    [provider]  Netarsudil Dimesylate (RHOPRESSA) 0.02 % SOLN Apply 1 drop to eye at bedtime.    [provider]  oxyCODONE-acetaminophen (PERCOCET) 10-325 MG tablet Take 1 tablet by mouth 3 (three) times daily. 04/29/21   [provider]  pantoprazole (PROTONIX) 40 MG tablet Take 40 mg by mouth in the morning. 06/09/20   [provider]  potassium chloride (KLOR-CON) 10 MEQ tablet Take 20 mEq by mouth in the morning. 06/09/20   [provider]  prednisoLONE acetate (PRED FORTE) 1 % ophthalmic suspension Place 1 drop into both eyes See admin instructions. Instill 1 drop into right eye scheduled twice daily for inflammation/pain & instil 1 drop into left eye up to twice daily if needed for  redness/inflammation/pain.    [provider]  sertraline (ZOLOFT) 100 MG tablet Take 200 mg by mouth in the morning. 06/09/20   [provider]  traZODone (DESYREL) 50 MG tablet Take 50 mg by mouth at bedtime.    [provider]  verapamil (VERELAN PM) 360 MG 24 hr capsule Take 360 mg by mouth in the morning. 06/09/20   [provider]      Allergies    Patient has no known allergies.    Review of Systems   Review of Systems  Constitutional:  Negative for fever.  Respiratory:  Positive for shortness of breath.   Cardiovascular:  Positive for chest pain and leg swelling.  Musculoskeletal:  Positive for back pain (chronic).    Physical Exam Updated Vital Signs BP (!) 112/46   Pulse 81   Temp 98 F (36.7 C) (Oral)   Resp (!) 24   Ht '5\' 11"'$  (1.803 m)   Wt (!) 153.8 kg   SpO2 93%   BMI 47.28 kg/m  Physical Exam Vitals and nursing note reviewed.  Constitutional:      General: She is not in acute distress.    Appearance: She is well-developed. She is obese. She is not ill-appearing or diaphoretic.  HENT:     Head: Normocephalic and atraumatic.  Cardiovascular:     Rate and Rhythm: Normal rate and regular rhythm.     Heart sounds: Normal heart sounds.  Pulmonary:     Effort: Pulmonary effort is normal.     Breath sounds: Normal breath sounds.  Abdominal:     Palpations: Abdomen is soft.     Tenderness: There is no abdominal tenderness.  Musculoskeletal:     Right lower leg: No edema.     Left lower leg: No edema.  Skin:    General: Skin is warm and dry.  Neurological:     Mental Status: She is alert.     ED Results / Procedures / Treatments   Labs (all labs ordered are listed, but only abnormal results are displayed) Labs Reviewed  BASIC METABOLIC PANEL - Abnormal; Notable for the following components:      Result Value   Potassium 3.1 (*)    Glucose, Bld 116 (*)    Creatinine, Ser 1.05 (*)    All other components within  normal limits  CBC WITH DIFFERENTIAL/PLATELET - Abnormal; Notable for the following components:   RBC 3.72 (*)    MCV 100.5 (*)    RDW 15.7 (*)    Platelets 147 (*)    nRBC 2.7 (*)    All other components within normal limits  BRAIN NATRIURETIC PEPTIDE - Abnormal; Notable for the following components:   B Natriuretic Peptide 314.2 (*)    All other components within normal limits  D-DIMER, QUANTITATIVE - Abnormal; Notable for the following components:   D-Dimer, Quant 10.00 (*)  All other components within normal limits  CBG MONITORING, ED - Abnormal; Notable for the following components:   Glucose-Capillary 101 (*)    All other components within normal limits  TROPONIN I (HIGH SENSITIVITY) - Abnormal; Notable for the following components:   Troponin I (High Sensitivity) 27 (*)    All other components within normal limits  TROPONIN I (HIGH SENSITIVITY) - Abnormal; Notable for the following components:   Troponin I (High Sensitivity) 46 (*)    All other components within normal limits  RESP PANEL BY RT-PCR (RSV, FLU A&B, COVID)  RVPGX2    EKG EKG Interpretation  Date/Time:  Monday April 24 2022 19:34:14 EST Ventricular Rate:  98 PR Interval:  162 QRS Duration: 90 QT Interval:  421 QTC Calculation: 538 R Axis:   29 Text Interpretation: Sinus rhythm Anterior infarct, old Borderline T abnormalities, inferior leads Prolonged QT interval Confirmed by Sherwood Gambler [phone]) on 04/24/2022 7:39:23 PM  Radiology DG Chest 2 View  Result Date: 04/24/2022 CLINICAL DATA:  Short of breath EXAM: CHEST - 2 VIEW COMPARISON:  None Available. FINDINGS: Normal mediastinum and cardiac silhouette. Low lung volumes. Central venous congestion. No focal consolidation. No pneumothorax. IMPRESSION: Low lung volumes and central venous congestion. Electronically Signed   By: Suzy Bouchard M.D.   On: 04/24/2022 17:59    Procedures Procedures    Medications Ordered in ED Medications   oxyCODONE (Oxy IR/ROXICODONE) immediate release tablet 5 mg (5 mg Oral Not Given 04/24/22 2109)  potassium chloride SA (KLOR-CON M) CR tablet 40 mEq (40 mEq Oral Given 04/24/22 1922)  naproxen (NAPROSYN) tablet 500 mg (500 mg Oral Given 04/24/22 2043)  oxyCODONE-acetaminophen (PERCOCET/ROXICET) 5-325 MG per tablet 1 tablet (1 tablet Oral Given 04/24/22 2043)  aspirin chewable tablet 324 mg (324 mg Oral Given 04/24/22 2323)    ED Course/ Medical Decision Making/ A&P                             Medical Decision Making Amount and/or Complexity of Data Reviewed Labs: ordered.    Details: Troponin mildly elevated though has risen from 27 up to 46.  D-dimer is elevated at 10.  BNP is elevated at 300.  Mild hypokalemia. Radiology: ordered and independent interpretation performed.    Details: Some central congestion but no overt CHF. ECG/medicine tests: ordered and independent interpretation performed.    Details: nonspecific T waves.  Risk OTC drugs. Prescription drug management.   Patient presents with dyspnea.  Wears oxygen at night but now wearing it during the day.  Progressive symptoms especially exertional.  With her recent surgery I think a workup for PE is needed as this could cause low-level troponin elevation and BNP elevation.  However she could also have pulmonary hypertension versus CHF.  CTA is currently pending given the significantly elevated D-dimer.  Care transferred to Dr. Dayna Barker.        Final Clinical Impression(s) / ED Diagnoses Final diagnoses:  Acute respiratory failure with hypoxia [facility])    Rx / DC Orders ED Discharge Orders     None         Sherwood Gambler, MD 04/24/22 [phone]

## 2022-04-24 NOTE — ED Triage Notes (Signed)
Pt BIB [facility] from home, SHOB x 2 weeks, progressively getting worse. Wears  o2 at night. SHOB worse with exertion. Denies chest pain. No medications given by EMS. O2 86%ra, placed  on 2L by EMS.  118/92, hr 100, 22RR. 95%2l

## 2022-04-24 NOTE — ED Provider Triage Note (Signed)
Emergency Medicine Provider Triage Evaluation Note  Julie Cruz , a 57 y.o. female  was evaluated in triage.  Pt complains of worsening shortness of breath of the last 2 weeks.  History of sarcoidosis which has affected her heart per patient.  Also reports occasional accompanying chest discomfort in the center of her chest, nonradiating.  Symptoms worse with exertion, somewhat improves with rest.  Denies recent URI symptoms, cough, N/V/D, fever, chills.  No Hx of PE or DVT.  Not on anticoagulation.  Review of Systems  Positive:  Negative: See above  Physical Exam  BP (!) 140/89 (BP Location: Right Arm)   Pulse 95   Temp 98.4 F (36.9 C) (Oral)   Resp 18   Ht '5\' 11"'$  (1.803 m)   Wt (!) 153.8 kg   SpO2 95%   BMI 47.28 kg/m  Gen:   Awake, no distress   Resp:  Normal effort  MSK:   Moves extremities without difficulty  Other:  On 2 L O2 via [place].  Sitting somewhat comfortably, overall anxious appearing, tearful.  Chest non-TTP.  CTAB, equal chest rise.  Adequate air movement.  Communicates without difficulty.  Medical Decision Making  Medically screening exam initiated at 4:51 PM.  Appropriate orders placed.  Julie Cruz was informed that the remainder of the evaluation will be completed by another provider, this initial triage assessment does not replace that evaluation, and the importance of remaining in the ED until their evaluation is complete.     Prince Rome, PA-C [phone]

## 2022-04-25 ENCOUNTER — Encounter (HOSPITAL_COMMUNITY): Payer: Self-pay | Admitting: Internal Medicine

## 2022-04-25 ENCOUNTER — Observation Stay (HOSPITAL_BASED_OUTPATIENT_CLINIC_OR_DEPARTMENT_OTHER): Payer: Medicare HMO

## 2022-04-25 ENCOUNTER — Observation Stay (HOSPITAL_COMMUNITY): Payer: Medicare HMO

## 2022-04-25 DIAGNOSIS — Z87891 Personal history of nicotine dependence: Secondary | ICD-10-CM | POA: Diagnosis not present

## 2022-04-25 DIAGNOSIS — Z800 Family history of malignant neoplasm of digestive organs: Secondary | ICD-10-CM | POA: Diagnosis not present

## 2022-04-25 DIAGNOSIS — I10 Essential (primary) hypertension: Secondary | ICD-10-CM | POA: Diagnosis present

## 2022-04-25 DIAGNOSIS — I82409 Acute embolism and thrombosis of unspecified deep veins of unspecified lower extremity: Secondary | ICD-10-CM

## 2022-04-25 DIAGNOSIS — I2609 Other pulmonary embolism with acute cor pulmonale: Secondary | ICD-10-CM | POA: Diagnosis not present

## 2022-04-25 DIAGNOSIS — R9431 Abnormal electrocardiogram [ECG] [EKG]: Secondary | ICD-10-CM | POA: Diagnosis present

## 2022-04-25 DIAGNOSIS — I272 Pulmonary hypertension, unspecified: Secondary | ICD-10-CM | POA: Diagnosis present

## 2022-04-25 DIAGNOSIS — G894 Chronic pain syndrome: Secondary | ICD-10-CM | POA: Diagnosis not present

## 2022-04-25 DIAGNOSIS — Z1152 Encounter for screening for COVID-19: Secondary | ICD-10-CM | POA: Diagnosis not present

## 2022-04-25 DIAGNOSIS — E876 Hypokalemia: Secondary | ICD-10-CM | POA: Diagnosis present

## 2022-04-25 DIAGNOSIS — R7989 Other specified abnormal findings of blood chemistry: Secondary | ICD-10-CM | POA: Diagnosis not present

## 2022-04-25 DIAGNOSIS — I2699 Other pulmonary embolism without acute cor pulmonale: Secondary | ICD-10-CM | POA: Diagnosis present

## 2022-04-25 DIAGNOSIS — G4733 Obstructive sleep apnea (adult) (pediatric): Secondary | ICD-10-CM | POA: Diagnosis present

## 2022-04-25 DIAGNOSIS — D869 Sarcoidosis, unspecified: Secondary | ICD-10-CM

## 2022-04-25 DIAGNOSIS — H409 Unspecified glaucoma: Secondary | ICD-10-CM | POA: Diagnosis present

## 2022-04-25 DIAGNOSIS — R0602 Shortness of breath: Secondary | ICD-10-CM | POA: Diagnosis not present

## 2022-04-25 DIAGNOSIS — M797 Fibromyalgia: Secondary | ICD-10-CM | POA: Diagnosis present

## 2022-04-25 DIAGNOSIS — Z8249 Family history of ischemic heart disease and other diseases of the circulatory system: Secondary | ICD-10-CM | POA: Diagnosis not present

## 2022-04-25 DIAGNOSIS — F411 Generalized anxiety disorder: Secondary | ICD-10-CM | POA: Diagnosis present

## 2022-04-25 DIAGNOSIS — J9601 Acute respiratory failure with hypoxia: Principal | ICD-10-CM | POA: Diagnosis present

## 2022-04-25 DIAGNOSIS — K219 Gastro-esophageal reflux disease without esophagitis: Secondary | ICD-10-CM | POA: Diagnosis present

## 2022-04-25 DIAGNOSIS — Z818 Family history of other mental and behavioral disorders: Secondary | ICD-10-CM | POA: Diagnosis not present

## 2022-04-25 DIAGNOSIS — F319 Bipolar disorder, unspecified: Secondary | ICD-10-CM | POA: Diagnosis present

## 2022-04-25 DIAGNOSIS — I82442 Acute embolism and thrombosis of left tibial vein: Secondary | ICD-10-CM

## 2022-04-25 DIAGNOSIS — I358 Other nonrheumatic aortic valve disorders: Secondary | ICD-10-CM | POA: Diagnosis present

## 2022-04-25 DIAGNOSIS — H4041X0 Glaucoma secondary to eye inflammation, right eye, stage unspecified: Secondary | ICD-10-CM

## 2022-04-25 DIAGNOSIS — Z6841 Body Mass Index (BMI) 40.0 and over, adult: Secondary | ICD-10-CM | POA: Diagnosis not present

## 2022-04-25 LAB — CBC WITH DIFFERENTIAL/PLATELET
Abs Immature Granulocytes: 0.02 10*3/uL (ref 0.00–0.07)
Basophils Absolute: 0 10*3/uL (ref 0.0–0.1)
Basophils Relative: 1 %
Eosinophils Absolute: 0.3 10*3/uL (ref 0.0–0.5)
Eosinophils Relative: 4 %
HCT: 35.8 % — ABNORMAL LOW (ref 36.0–46.0)
Hemoglobin: 11.9 g/dL — ABNORMAL LOW (ref 12.0–15.0)
Immature Granulocytes: 0 %
Lymphocytes Relative: 39 %
Lymphs Abs: 2.2 10*3/uL (ref 0.7–4.0)
MCH: 33.3 pg (ref 26.0–34.0)
MCHC: 33.2 g/dL (ref 30.0–36.0)
MCV: 100.3 fL — ABNORMAL HIGH (ref 80.0–100.0)
Monocytes Absolute: 0.5 10*3/uL (ref 0.1–1.0)
Monocytes Relative: 9 %
Neutro Abs: 2.7 10*3/uL (ref 1.7–7.7)
Neutrophils Relative %: 47 %
Platelets: 142 10*3/uL — ABNORMAL LOW (ref 150–400)
RBC: 3.57 MIL/uL — ABNORMAL LOW (ref 3.87–5.11)
RDW: 15.8 % — ABNORMAL HIGH (ref 11.5–15.5)
WBC: 5.7 10*3/uL (ref 4.0–10.5)
nRBC: 1.2 % — ABNORMAL HIGH (ref 0.0–0.2)

## 2022-04-25 LAB — I-STAT VENOUS BLOOD GAS, ED
Acid-Base Excess: 3 mmol/L — ABNORMAL HIGH (ref 0.0–2.0)
Bicarbonate: 26 mmol/L (ref 20.0–28.0)
Calcium, Ion: 1.12 mmol/L — ABNORMAL LOW (ref 1.15–1.40)
HCT: 37 % (ref 36.0–46.0)
Hemoglobin: 12.6 g/dL (ref 12.0–15.0)
O2 Saturation: 100 %
Potassium: 3.5 mmol/L (ref 3.5–5.1)
Sodium: 142 mmol/L (ref 135–145)
TCO2: 27 mmol/L (ref 22–32)
pCO2, Ven: 34.7 mmHg — ABNORMAL LOW (ref 44–60)
pH, Ven: 7.483 — ABNORMAL HIGH (ref 7.25–7.43)
pO2, Ven: 180 mmHg — ABNORMAL HIGH (ref 32–45)

## 2022-04-25 LAB — BLOOD GAS, VENOUS
Acid-Base Excess: 1.2 mmol/L (ref 0.0–2.0)
Bicarbonate: 26.6 mmol/L (ref 20.0–28.0)
O2 Saturation: 78.9 %
Patient temperature: 37.8
pCO2, Ven: 46 mmHg (ref 44–60)
pH, Ven: 7.38 (ref 7.25–7.43)
pO2, Ven: 51 mmHg — ABNORMAL HIGH (ref 32–45)

## 2022-04-25 LAB — LACTIC ACID, PLASMA: Lactic Acid, Venous: 1.6 mmol/L (ref 0.5–1.9)

## 2022-04-25 LAB — COMPREHENSIVE METABOLIC PANEL
ALT: 50 U/L — ABNORMAL HIGH (ref 0–44)
AST: 43 U/L — ABNORMAL HIGH (ref 15–41)
Albumin: 3.9 g/dL (ref 3.5–5.0)
Alkaline Phosphatase: 54 U/L (ref 38–126)
Anion gap: 10 (ref 5–15)
BUN: 9 mg/dL (ref 6–20)
CO2: 26 mmol/L (ref 22–32)
Calcium: 9.1 mg/dL (ref 8.9–10.3)
Chloride: 105 mmol/L (ref 98–111)
Creatinine, Ser: 1.13 mg/dL — ABNORMAL HIGH (ref 0.44–1.00)
GFR, Estimated: 57 mL/min — ABNORMAL LOW (ref >60)
Glucose, Bld: 130 mg/dL — ABNORMAL HIGH (ref 70–99)
Potassium: 3.2 mmol/L — ABNORMAL LOW (ref 3.5–5.1)
Sodium: 141 mmol/L (ref 135–145)
Total Bilirubin: 0.6 mg/dL (ref 0.3–1.2)
Total Protein: 6.7 g/dL (ref 6.5–8.1)

## 2022-04-25 LAB — ECHOCARDIOGRAM COMPLETE BUBBLE STUDY
Area-P 1/2: 2.25 cm2
MV VTI: 3.8 cm2
S' Lateral: 2.8 cm

## 2022-04-25 LAB — PROCALCITONIN: Procalcitonin: <0.1 ng/mL

## 2022-04-25 LAB — PHOSPHORUS: Phosphorus: 3.8 mg/dL (ref 2.5–4.6)

## 2022-04-25 LAB — BRAIN NATRIURETIC PEPTIDE: B Natriuretic Peptide: 364.8 pg/mL — ABNORMAL HIGH (ref 0.0–100.0)

## 2022-04-25 LAB — HEPARIN LEVEL (UNFRACTIONATED)
Heparin Unfractionated: 0.52 IU/mL (ref 0.30–0.70)
Heparin Unfractionated: 0.42 IU/mL (ref 0.30–0.70)

## 2022-04-25 LAB — TROPONIN I (HIGH SENSITIVITY): Troponin I (High Sensitivity): 23 ng/L — ABNORMAL HIGH (ref <18)

## 2022-04-25 LAB — MAGNESIUM: Magnesium: 2 mg/dL (ref 1.7–2.4)

## 2022-04-25 MED ORDER — DIFLUPREDNATE 0.05 % OP EMUL
1.0000 [drp] | Freq: Four times a day (QID) | OPHTHALMIC | Status: DC
  Filled 2022-04-25: qty 0.1

## 2022-04-25 MED ORDER — ACETAMINOPHEN 325 MG PO TABS: 650.0000 mg | ORAL_TABLET | Freq: Four times a day (QID) | ORAL | Status: DC | PRN

## 2022-04-25 MED ORDER — HEPARIN BOLUS VIA INFUSION
7000.0000 [IU] | Freq: Once | INTRAVENOUS | Status: AC
  Administered 2022-04-25: 7000 [IU] via INTRAVENOUS
  Filled 2022-04-25: qty 7000

## 2022-04-25 MED ORDER — CLONAZEPAM 0.5 MG PO TABS
0.5000 mg | ORAL_TABLET | Freq: Three times a day (TID) | ORAL | Status: DC | PRN
  Administered 2022-04-25: 0.5 mg via ORAL
  Filled 2022-04-25: qty 1

## 2022-04-25 MED ORDER — POTASSIUM CHLORIDE CRYS ER 20 MEQ PO TBCR
20.0000 meq | EXTENDED_RELEASE_TABLET | Freq: Every day | ORAL | Status: DC
  Administered 2022-04-25 – 2022-04-26 (×2): 20 meq via ORAL
  Filled 2022-04-25 (×2): qty 1

## 2022-04-25 MED ORDER — LATANOPROST 0.005 % OP SOLN
1.0000 [drp] | Freq: Every day | OPHTHALMIC | Status: DC
  Administered 2022-04-25: 1 [drp] via OPHTHALMIC
  Filled 2022-04-25: qty 2.5

## 2022-04-25 MED ORDER — TRAZODONE HCL 50 MG PO TABS
50.0000 mg | ORAL_TABLET | Freq: Every evening | ORAL | Status: DC | PRN
  Administered 2022-04-25 (×2): 50 mg via ORAL
  Filled 2022-04-25 (×2): qty 1

## 2022-04-25 MED ORDER — METHOTREXATE SODIUM 2.5 MG PO TABS
25.0000 mg | ORAL_TABLET | ORAL | Status: DC
  Administered 2022-04-25: 25 mg via ORAL
  Filled 2022-04-25: qty 10

## 2022-04-25 MED ORDER — PANTOPRAZOLE SODIUM 40 MG PO TBEC
40.0000 mg | DELAYED_RELEASE_TABLET | Freq: Every morning | ORAL | Status: DC
  Administered 2022-04-25 – 2022-04-26 (×2): 40 mg via ORAL
  Filled 2022-04-25 (×2): qty 1

## 2022-04-25 MED ORDER — CYCLOBENZAPRINE HCL 10 MG PO TABS: 10.0000 mg | ORAL_TABLET | Freq: Every day | ORAL | Status: DC | PRN

## 2022-04-25 MED ORDER — IOHEXOL 350 MG/ML SOLN
90.0000 mL | Freq: Once | INTRAVENOUS | Status: AC | PRN
  Administered 2022-04-25: 90 mL via INTRAVENOUS

## 2022-04-25 MED ORDER — HEPARIN (PORCINE) 25000 UT/250ML-% IV SOLN
1500.0000 [IU]/h | INTRAVENOUS | Status: DC
  Administered 2022-04-25 – 2022-04-26 (×3): 1500 [IU]/h via INTRAVENOUS
  Filled 2022-04-25 (×3): qty 250

## 2022-04-25 MED ORDER — LORAZEPAM 1 MG PO TABS
0.5000 mg | ORAL_TABLET | Freq: Four times a day (QID) | ORAL | Status: DC | PRN
  Administered 2022-04-25: 0.5 mg via ORAL
  Filled 2022-04-25: qty 1

## 2022-04-25 MED ORDER — HYDROMORPHONE HCL 1 MG/ML IJ SOLN: 0.5000 mg | INTRAMUSCULAR | Status: DC | PRN

## 2022-04-25 MED ORDER — DORZOLAMIDE HCL 2 % OP SOLN
1.0000 [drp] | Freq: Every day | OPHTHALMIC | Status: DC
  Administered 2022-04-25: 1 [drp] via OPHTHALMIC
  Filled 2022-04-25: qty 10

## 2022-04-25 MED ORDER — LACTATED RINGERS IV SOLN: INTRAVENOUS | Status: AC

## 2022-04-25 MED ORDER — OXYCODONE-ACETAMINOPHEN 5-325 MG PO TABS
1.0000 | ORAL_TABLET | Freq: Four times a day (QID) | ORAL | Status: DC | PRN
  Administered 2022-04-25: 1 via ORAL
  Filled 2022-04-25: qty 1

## 2022-04-25 MED ORDER — MELATONIN 3 MG PO TABS: 3.0000 mg | ORAL_TABLET | Freq: Every evening | ORAL | Status: DC | PRN

## 2022-04-25 MED ORDER — SERTRALINE HCL 100 MG PO TABS
200.0000 mg | ORAL_TABLET | Freq: Every morning | ORAL | Status: DC
  Administered 2022-04-26: 200 mg via ORAL
  Filled 2022-04-25 (×2): qty 2

## 2022-04-25 MED ORDER — NALOXONE HCL 0.4 MG/ML IJ SOLN: 0.4000 mg | INTRAMUSCULAR | Status: DC | PRN

## 2022-04-25 MED ORDER — PERFLUTREN LIPID MICROSPHERE
1.0000 mL | INTRAVENOUS | Status: AC | PRN
  Administered 2022-04-25: 2 mL via INTRAVENOUS

## 2022-04-25 MED ORDER — ACETAMINOPHEN 650 MG RE SUPP: 650.0000 mg | Freq: Four times a day (QID) | RECTAL | Status: DC | PRN

## 2022-04-25 MED ORDER — POTASSIUM CHLORIDE CRYS ER 20 MEQ PO TBCR
40.0000 meq | EXTENDED_RELEASE_TABLET | Freq: Once | ORAL | Status: AC
  Administered 2022-04-25: 40 meq via ORAL
  Filled 2022-04-25: qty 2

## 2022-04-25 MED ORDER — BRIMONIDINE TARTRATE 0.2 % OP SOLN
1.0000 [drp] | Freq: Two times a day (BID) | OPHTHALMIC | Status: DC
  Administered 2022-04-25: 1 [drp] via OPHTHALMIC
  Filled 2022-04-25: qty 5

## 2022-04-25 MED ORDER — SERTRALINE HCL 100 MG PO TABS: 200.0000 mg | ORAL_TABLET | Freq: Every morning | ORAL | Status: DC

## 2022-04-25 NOTE — Progress Notes (Signed)
ANTICOAGULATION CONSULT NOTE - Initial Consult  Pharmacy Consult for Heparin  Indication: pulmonary embolus  No Known Allergies  Patient Measurements: Height: '5\' 11"'$  (180.3 cm) Weight: (!) 153.8 kg (339 lb) IBW/kg (Calculated) : 70.8  Vital Signs: Temp: 98.7 F (37.1 C) (02/27 0100) Temp Source: Oral (02/26 2100) BP: 112/46 (02/26 2345) Pulse Rate: 81 (02/26 2345)  Labs: Recent Labs    04/24/22 1659 04/24/22 2045  HGB 12.5  --   HCT 37.4  --   PLT 147*  --   CREATININE 1.05*  --   TROPONINIHS 27* 46*    Estimated Creatinine Clearance: 99.4 mL/min (A) (by C-G formula based on SCr of 1.05 mg/dL (H)).   Medical History: Past Medical History:  Diagnosis Date   Acute medial meniscus tear    Anxiety    Bipolar 1 disorder (HCC)    Depression    Fibromyalgia    H/O spinal cord injury    T, L ans CSpine   Hypertension    Menopause    Pulmonary hypertension (HCC)    Sarcoid, cardiac     Assessment: 57 y/o F with progressive shortness of breath for 2 weeks, found to have new onset PE on CT angio, starting heparin, labs above reviewed, PTA meds reviewed.   Goal of Therapy:  Heparin level 0.3-0.7 units/ml Monitor platelets by anticoagulation protocol: Yes   Plan:  Heparin 7000 units BOLUS Start heparin drip at 1500 units/hr 1000 Heparin level Daily CBC/Heparin level Monitor for bleeding  Narda Bonds, PharmD, BCPS Clinical Pharmacist Phone: [phone]

## 2022-04-25 NOTE — Progress Notes (Signed)
ANTICOAGULATION CONSULT NOTE  Pharmacy Consult for Heparin  Indication: pulmonary embolus  No Known Allergies  Patient Measurements: Height: '5\' 11"'$  (180.3 cm) Weight: (!) 153.8 kg (339 lb) IBW/kg (Calculated) : 70.8  Vital Signs: Temp: 100 F (37.8 C) (02/27 1800) Temp Source: Oral (02/27 1800) BP: 112/64 (02/27 1800) Pulse Rate: 102 (02/27 1800)  Labs: Recent Labs    04/24/22 1659 04/24/22 2045 04/25/22 0250 04/25/22 0430 04/25/22 0524 04/25/22 1126 04/25/22 1806  HGB 12.5  --  11.9*  --  12.6  --   --   HCT 37.4  --  35.8*  --  37.0  --   --   PLT 147*  --  142*  --   --   --   --   HEPARINUNFRC  --   --   --   --   --  0.52 0.42  CREATININE 1.05*  --  1.13*  --   --   --   --   TROPONINIHS 27* 46*  --  23*  --   --   --      Estimated Creatinine Clearance: 92.4 mL/min (A) (by C-G formula based on SCr of 1.13 mg/dL (H)).   Medical History: Past Medical History:  Diagnosis Date   Acute medial meniscus tear    Anxiety    Bipolar 1 disorder (HCC)    Depression    Fibromyalgia    H/O spinal cord injury    T, L ans CSpine   Hypertension    Menopause    Pulmonary hypertension (HCC)    Sarcoid, cardiac     Assessment: 57 y/o F with progressive shortness of breath for 2 weeks, found to have new onset PE on CT angio, starting heparin, labs above reviewed, PTA meds reviewed.   Heparin level remains therapeutic.  Goal of Therapy:  Heparin level 0.3-0.7 units/ml Monitor platelets by anticoagulation protocol: Yes   Plan:  Continue heparin at 1500 units/hr Daily heparin level and CBC  Arrie Senate, PharmD, [place], [facility] Clinical Pharmacist [phone] Please check AMION for all [facility] Pharmacy numbers 04/25/2022

## 2022-04-25 NOTE — Assessment & Plan Note (Signed)
repleted ?

## 2022-04-25 NOTE — Progress Notes (Signed)
Pt arrived to unit from .....ED...[facility] KitchenMarland Kitchen. VSS, A/O x 4,  CCMD called ,CHG given, pt oriented to unit,Will continue to monitor.   Phoebe Sharps, RN

## 2022-04-25 NOTE — Assessment & Plan Note (Signed)
Continue Protonix °

## 2022-04-25 NOTE — Consult Note (Signed)
NAME:  Julie Cruz, MRN:  [id number], DOB:  12/30/1965, LOS: 0 ADMISSION DATE:  04/24/2022, CONSULTATION DATE:  2/27 REFERRING MD:  Dr. Velia Meyer ED, CHIEF COMPLAINT:  SOB   History of Present Illness:  57 year old with past medical history as below, which is significant for sarcoidosis, uveitis and glaucoma status post eye surgery on 1/10.  She is followed by Dr. Erin Fulling in the pulmonary clinic.  She is also followed by rheumatology for sarcoidosis and is treated with methotrexate.  Medical history also significant for sleep apnea, for which she requires BiPAP and nocturnal oxygen.  She was in her usual state of health until approximately 2/17 when she noted worsening shortness of breath, but did not think too much of it as she does have shortness of breath from time to time.  This remained relatively stable until about 2/23 when she noted shortness of breath to be significantly limiting her exercise tolerance to the point she would be short of breath with just basic conversation.  She presented to [facility] emergency department on 2/26 with this complaint.  She was noted to have oxygen saturations in the 80s on room air which improved with 2 L of supplemental oxygen.  With recent surgery D-dimer was assessed and was positive.  CT angiogram of the chest was done which demonstrated acute pulmonary embolus and CT evidence of right heart strain with RV/LV 1.47.  PCCM was asked to evaluate.  Pertinent  Medical History   has a past medical history of Acute medial meniscus tear, Anxiety, Bipolar 1 disorder ([place]), Depression, Fibromyalgia, H/O spinal cord injury, Hypertension, Menopause, Pulmonary hypertension ([place]), and Sarcoid, cardiac.   Significant Hospital Events: Including procedures, antibiotic start and stop dates in addition to other pertinent events     Interim History / Subjective:    Objective   Blood pressure (!) 112/46, pulse 81, temperature 98.7 F (37.1 C), resp. rate (!) 24,  height '5\' 11"'$  (1.803 m), weight (!) 153.8 kg, SpO2 93 %.       No intake or output data in the 24 hours ending 04/25/22 0149 Filed Weights   04/24/22 1642  Weight: (!) 153.8 kg    Examination: General: Obese middle-aged female in no acute distress HENT: Normocephalic, atraumatic, no JVD Lungs: Clear bilateral breath sounds Cardiovascular: Regular rate and rhythm, no murmurs/rubs/gallops Abdomen: Soft, nontender, nondistended Extremities: No acute deformity, range of motion limitation, or edema Neuro: Alert, oriented, nonfocal   Resolved Hospital Problem list     Assessment & Plan:   Bilateral pulmonary emboli: Multiple filling defects within the lobar and segmental pulmonary arteries bilaterally.  Embolic burden considered to be moderate by CT.  RV/LV ratio 1.47.  High-sensitivity troponin 46, BNP 314 with no baseline for comparison, and lactic acid is pending at this time.  She has been started on heparin infusion in the emergency department.  She is on 2 L nasal cannula, is normotensive, and is not tachycardic.  She is in no distress. -Continue heparin infusion per pharmacy -Obtain echocardiogram -Trend troponin, BNP, and obtain lactic acid -Lower extremity Dopplers -Depending on biomarker trends and echocardiogram could consider catheter directed approach.  Obstructive sleep apnea, nocturnal hypoxia -Nightly BiPAP with supplemental oxygen 2 L  Sarcoidosis -Home medications per primary    Best Practice (right click and "Reselect all SmartList Selections" daily)   Diet/type: Regular consistency (see orders) DVT prophylaxis: systemic heparin GI prophylaxis: N/A Lines: N/A Foley:  N/A Code Status:  full code Last date  of multidisciplinary goals of care discussion '[ ]'$   Labs   CBC: Recent Labs  Lab 04/24/22 1659  WBC 5.1  NEUTROABS 2.4  HGB 12.5  HCT 37.4  MCV 100.5*  PLT 147*    Basic Metabolic Panel: Recent Labs  Lab 04/24/22 1659  NA 141  K 3.1*  CL  101  CO2 26  GLUCOSE 116*  BUN 7  CREATININE 1.05*  CALCIUM 9.5   GFR: Estimated Creatinine Clearance: 99.4 mL/min (A) (by C-G formula based on SCr of 1.05 mg/dL (H)). Recent Labs  Lab 04/24/22 1659  WBC 5.1    Liver Function Tests: No results for input(s): "AST", "ALT", "ALKPHOS", "BILITOT", "PROT", "ALBUMIN" in the last 168 hours. No results for input(s): "LIPASE", "AMYLASE" in the last 168 hours. No results for input(s): "AMMONIA" in the last 168 hours.  ABG No results found for: "PHART", "PCO2ART", "PO2ART", "HCO3", "TCO2", "ACIDBASEDEF", "O2SAT"   Coagulation Profile: No results for input(s): "INR", "PROTIME" in the last 168 hours.  Cardiac Enzymes: No results for input(s): "CKTOTAL", "CKMB", "CKMBINDEX", "TROPONINI" in the last 168 hours.  HbA1C: No results found for: "HGBA1C"  CBG: Recent Labs  Lab 04/24/22 1758  GLUCAP 101*    Review of Systems:   Bolds are positive  Constitutional: weight loss, gain, night sweats, Fevers, chills, fatigue .  HEENT: headaches, Sore throat, sneezing, nasal congestion, post nasal drip, Difficulty swallowing, Tooth/dental problems, visual complaints visual changes, ear ache CV:  chest pain, radiates:,Orthopnea, PND, swelling in lower extremities, dizziness, palpitations, syncope.  GI  heartburn, indigestion, abdominal pain, nausea, vomiting, diarrhea, change in bowel habits, loss of appetite, bloody stools.  Resp: cough, productive: , hemoptysis, dyspnea, chest pain, pleuritic.  Skin: rash or itching or icterus GU: dysuria, change in color of urine, urgency or frequency. flank pain, hematuria  MS: joint pain or swelling. decreased range of motion  Psych: change in mood or affect. depression or anxiety.  Neuro: difficulty with speech, weakness, numbness, ataxia    Past Medical History:  She,  has a past medical history of Acute medial meniscus tear, Anxiety, Bipolar 1 disorder ([place]), Depression, Fibromyalgia, H/O spinal cord  injury, Hypertension, Menopause, Pulmonary hypertension ([place]), and Sarcoid, cardiac.   Surgical History:   Past Surgical History:  Procedure Laterality Date   C Section     Catherization      x2   COLONOSCOPY WITH PROPOFOL N/A 10/15/2020   Procedure: COLONOSCOPY WITH PROPOFOL;  Surgeon: Carol Ada, MD;  Location: [facility] ENDOSCOPY;  Service: Endoscopy;  Laterality: N/A;   [place]  pregnacy     LUNG BIOPSY     TUBAL LIGATION       Social History:   reports that she quit smoking about 17 years ago. Her smoking use included cigarettes. She has never used smokeless tobacco. She reports current alcohol use of about 2.0 standard drinks of alcohol per week. She reports that she does not use drugs.   Family History:  Her family history includes Asthma in her mother; Cancer in her father; Depression in her brother and mother; Diabetes in her father; HIV/AIDS in her brother; Heart attack in her brother; Hyperlipidemia in her mother; Rheum arthritis in her mother; Thyroid disease in her mother.   Allergies No Known Allergies   Home Medications  Prior to Admission medications   Medication Sig Start Date End Date Taking? Authorizing Provider  acetaminophen (TYLENOL) 650 MG CR tablet Take 650 mg by mouth every 8 (eight) hours as needed for pain.  [provider]  acetaZOLAMIDE (DIAMOX) 250 MG tablet Take 500 mg by mouth 2 (two) times daily.    [provider]  albuterol (VENTOLIN HFA) 108 (90 Base) MCG/ACT inhaler Inhale 2 puffs into the lungs every 6 (six) hours as needed for wheezing or shortness of breath. 10/06/20 01/26/22  Freddi Starr, MD  aspirin 325 MG tablet Take 325 mg by mouth daily as needed (pain/chest tightness).    [provider]  brimonidine (ALPHAGAN P) 0.1 % SOLN Place 1 drop into both eyes in the morning and at bedtime.    [provider]  clonazePAM (KLONOPIN) 0.5 MG tablet Take 0.5 mg by mouth daily.    [provider]   cyclobenzaprine (FLEXERIL) 10 MG tablet Take 10-20 mg by mouth 3 (three) times daily as needed for muscle spasms.    [provider]  dorzolamide (TRUSOPT) 2 % ophthalmic solution  04/07/21   [provider]  folic acid (FOLVITE) 1 MG tablet Take 1 tablet by mouth daily. 01/03/22   [provider]  hydrochlorothiazide (HYDRODIURIL) 50 MG tablet Take 50 mg by mouth in the morning. 06/09/20   [provider]  ipratropium-albuterol (DUONEB) 0.5-2.5 (3) MG/3ML SOLN Take 3 mLs by nebulization every 6 (six) hours as needed. 01/26/22   Freddi Starr, MD  lisinopril (ZESTRIL) 40 MG tablet Take 40 mg by mouth in the morning. 06/09/20   [provider]  LUMIGAN 0.01 % SOLN Place 1 drop into both eyes at bedtime. 05/11/21   [provider]  Menthol (ICY HOT) 5 % PTCH Place 1 patch onto the skin daily as needed (pain.).    [provider]  Menthol, Topical Analgesic, (ICY HOT EX) Apply 1 application topically 3 (three) times daily as needed (pain.).    [provider]  methocarbamol (ROBAXIN) 750 MG tablet Take 750 mg by mouth every 8 (eight) hours as needed for muscle spasms. PRN    [provider]  Methotrexate Sodium (METHOTREXATE, PF,) 250 MG/10ML injection  07/21/21   [provider]  naproxen (NAPROSYN) 500 MG tablet Take 1,000 mg by mouth daily as needed (pain.).    [provider]  Netarsudil Dimesylate (RHOPRESSA) 0.02 % SOLN Apply 1 drop to eye at bedtime.    [provider]  oxyCODONE-acetaminophen (PERCOCET) 10-325 MG tablet Take 1 tablet by mouth 3 (three) times daily. 04/29/21   [provider]  pantoprazole (PROTONIX) 40 MG tablet Take 40 mg by mouth in the morning. 06/09/20   [provider]  potassium chloride (KLOR-CON) 10 MEQ tablet Take 20 mEq by mouth in the morning. 06/09/20   [provider]  prednisoLONE acetate (PRED FORTE) 1 % ophthalmic suspension Place 1  drop into both eyes See admin instructions. Instill 1 drop into right eye scheduled twice daily for inflammation/pain & instil 1 drop into left eye up to twice daily if needed for redness/inflammation/pain.    [provider]  sertraline (ZOLOFT) 100 MG tablet Take 200 mg by mouth in the morning. 06/09/20   [provider]  traZODone (DESYREL) 50 MG tablet Take 50 mg by mouth at bedtime.    [provider]  verapamil (VERELAN PM) 360 MG 24 hr capsule Take 360 mg by mouth in the morning. 06/09/20   [provider]     Critical care time:      Georgann Housekeeper, AGACNP-BC [place] for personal pager PCCM on call  pager [phone] until 7pm. Please call Elink 7p-7a. [phone]  04/25/2022 2:01 AM

## 2022-04-25 NOTE — Assessment & Plan Note (Addendum)
-   likely multifactorial etiology: decreased mobility, obesity. No prior hx VTE - per CTA: multiple branching intraluminal filling defects within the lobar and segmental pulmonary arteries bilaterally in keeping with acute pulmonary embolus - echo obtained: EF 65-70%, no RWMA, mild LVH. RV systolic function moderately reduced, RV size mildly enlarged - continue heparin drip - remains stable on 2L, no distress, no worsening - trop has downtrended (27 >> 46 >> 23) - BNP (314 >> 364) - discussed with pulmonology regarding results; will continue on heparin for now. Not pursuing lytics at this time given her stability

## 2022-04-25 NOTE — Assessment & Plan Note (Signed)
-   Continue as needed Percocet

## 2022-04-25 NOTE — Assessment & Plan Note (Signed)
-   will use caution with prolongation agents

## 2022-04-25 NOTE — Progress Notes (Signed)
Bilateral lower extremity venous study completed.   Preliminary results relayed to [facility], RN.  Please see CV Procedures for preliminary results.  Marquise Lambson, RVT  11:24 AM 04/25/22

## 2022-04-25 NOTE — Assessment & Plan Note (Signed)
-   Needs formal outpatient sleep study if not done already

## 2022-04-25 NOTE — Progress Notes (Signed)
Report received from ED 

## 2022-04-25 NOTE — ED Notes (Signed)
Patient resting with eyes closed. No acute concerns at this time. Bed in lowest position. Call light within reach.

## 2022-04-25 NOTE — ED Notes (Signed)
ED TO INPATIENT HANDOFF REPORT  ED Nurse Name and Phone #: Albina Billet [phone]  S Name/Age/Gender Lubertha Basque 57 y.o. female Room/Bed: 001C/001C  Code Status   Code Status: Full Code  Home/SNF/Other Home Patient oriented to: self, place, time, and situation Is this baseline? Yes   Triage Complete: Triage complete  Chief Complaint Acute pulmonary embolism ([place]) [I26.99]  Triage Note Pt BIB [facility] from home, SHOB x 2 weeks, progressively getting worse. Wears  o2 at night. SHOB worse with exertion. Denies chest pain. No medications given by EMS. O2 86%ra, placed  on 2L by EMS.  118/92, hr 100, 22RR. 95%2l   Allergies No Known Allergies  Level of Care/Admitting Diagnosis ED Disposition   ED Disposition: Admit Condition: None Comment: Hospital Area: [place] [[id number]]  Level of Care: Progressive [102]  Admit to Progressive based on following criteria: MULTISYSTEM THREATS such as stable sepsis, metabolic/electrolyte imbalance with or without encephalopathy that is responding to early treatment.  May place patient in observation at [facility] or [place] if equivalent level of care is available:: No  Covid Evaluation: Asymptomatic - no recent exposure (last 10 days) testing not required  Diagnosis: Acute pulmonary embolism [facility]) [id number]  Admitting Physician: Rhetta Mura [id number]  Attending Physician: Rhetta Mura [id number]      B Medical/Surgery History Past Medical History:  Diagnosis Date   Acute medial meniscus tear    Anxiety    Bipolar 1 disorder ([place])    Depression    Fibromyalgia    H/O spinal cord injury    T, L ans CSpine   Hypertension    Menopause    Pulmonary hypertension ([place])    Sarcoid, cardiac    Past Surgical History:  Procedure Laterality Date   C Section     Catherization      x2   COLONOSCOPY WITH PROPOFOL N/A 10/15/2020   Procedure: COLONOSCOPY WITH PROPOFOL;  Surgeon: Carol Ada, MD;  Location: [facility]  ENDOSCOPY;  Service: Endoscopy;  Laterality: N/A;   Etopic  pregnacy     LUNG BIOPSY     TUBAL LIGATION       A IV Location/Drains/Wounds Patient Lines/Drains/Airways Status     Active Line/Drains/Airways     Name Placement date Placement time Site Days   Peripheral IV 04/24/22 22 G 1.75" Anterior;Right Forearm 04/24/22  1958  Forearm  1   Peripheral IV 04/24/22 20 G 2.5" Anterior;Left;Upper Arm 04/24/22  2008  Arm  1            Intake/Output Last 24 hours  Intake/Output Summary (Last 24 hours) at 04/25/2022 1718 Last data filed at 04/25/2022 1243 Gross per 24 hour  Intake 527.67 ml  Output no documentation  Net 527.67 ml    Labs/Imaging Results for orders placed or performed during the hospital encounter of 04/24/22 (from the past 48 hour(s))  Basic metabolic panel     Status: Abnormal   Collection Time: 04/24/22  4:59 PM  Result Value Ref Range   Sodium 141 135 - 145 mmol/L   Potassium 3.1 (L) 3.5 - 5.1 mmol/L   Chloride 101 98 - 111 mmol/L   CO2 26 22 - 32 mmol/L   Glucose, Bld 116 (H) 70 - 99 mg/dL    Comment: Glucose reference range applies only to samples taken after fasting for at least 8 hours.   BUN 7 6 - 20 mg/dL   Creatinine, Ser 1.05 (H) 0.44 - 1.00 mg/dL  Calcium 9.5 8.9 - 10.3 mg/dL   GFR, Estimated >60 >60 mL/min    Comment: (NOTE) Calculated using the CKD-EPI Creatinine Equation (2021)    Anion gap 14 5 - 15    Comment: Performed at [facility], [place] [address] [place], [place] [address]  CBC with Differential     Status: Abnormal   Collection Time: 04/24/22  4:59 PM  Result Value Ref Range   WBC 5.1 4.0 - 10.5 K/uL   RBC 3.72 (L) 3.87 - 5.11 MIL/uL   Hemoglobin 12.5 12.0 - 15.0 g/dL   HCT 37.4 36.0 - 46.0 %   MCV 100.5 (H) 80.0 - 100.0 fL   MCH 33.6 26.0 - 34.0 pg   MCHC 33.4 30.0 - 36.0 g/dL   RDW 15.7 (H) 11.5 - 15.5 %   Platelets 147 (L) 150 - 400 K/uL   nRBC 2.7 (H) 0.0 - 0.2 %   Neutrophils Relative % 47 %   Neutro Abs 2.4  1.7 - 7.7 K/uL   Lymphocytes Relative 41 %   Lymphs Abs 2.1 0.7 - 4.0 K/uL   Monocytes Relative 8 %   Monocytes Absolute 0.4 0.1 - 1.0 K/uL   Eosinophils Relative 3 %   Eosinophils Absolute 0.2 0.0 - 0.5 K/uL   Basophils Relative 1 %   Basophils Absolute 0.0 0.0 - 0.1 K/uL   Immature Granulocytes 0 %   Abs Immature Granulocytes 0.02 0.00 - 0.07 K/uL    Comment: Performed at [facility], [address] [place], [place] [address]  Brain natriuretic peptide     Status: Abnormal   Collection Time: 04/24/22  4:59 PM  Result Value Ref Range   B Natriuretic Peptide 314.2 (H) 0.0 - 100.0 pg/mL    Comment: Performed at [place] [address], [place], [place] [address]  Troponin I (High Sensitivity)     Status: Abnormal   Collection Time: 04/24/22  4:59 PM  Result Value Ref Range   Troponin I (High Sensitivity) 27 (H) <18 ng/L    Comment: (NOTE) Elevated high sensitivity troponin I (hsTnI) values and significant  changes across serial measurements may suggest ACS but many other  chronic and acute conditions are known to elevate hsTnI results.  Refer to the "Links" section for chest pain algorithms and additional  guidance. Performed at [facility], [place] [address], [place], [place] [address]   CBG monitoring, ED     Status: Abnormal   Collection Time: 04/24/22  5:58 PM  Result Value Ref Range   Glucose-Capillary 101 (H) 70 - 99 mg/dL    Comment: Glucose reference range applies only to samples taken after fasting for at least 8 hours.  Resp panel by RT-PCR (RSV, Flu A&B, Covid) Anterior Nasal Swab     Status: None   Collection Time: 04/24/22  6:51 PM   Specimen: Anterior Nasal Swab  Result Value Ref Range   SARS Coronavirus 2 by RT PCR NEGATIVE NEGATIVE   Influenza A by PCR NEGATIVE NEGATIVE   Influenza B by PCR NEGATIVE NEGATIVE    Comment: (NOTE) The Xpert Xpress SARS-CoV-2/FLU/RSV plus assay is intended as an aid in the diagnosis of influenza from  Nasopharyngeal swab specimens and should not be used as a sole basis for treatment. Nasal washings and aspirates are unacceptable for Xpert Xpress SARS-CoV-2/FLU/RSV testing.  Fact Sheet for Patients: EntrepreneurPulse.com.au  Fact Sheet for Healthcare Providers: IncredibleEmployment.be  This test is not yet approved  or cleared by the [place] and has been authorized for detection and/or diagnosis of SARS-CoV-2 by FDA under an Emergency Use Authorization (EUA). This EUA will remain in effect (meaning this test can be used) for the duration of the COVID-19 declaration under Section 564(b)(1) of the Act, 21 U.S.C. section 360bbb-3(b)(1), unless the authorization is terminated or revoked.     Resp Syncytial Virus by PCR NEGATIVE NEGATIVE    Comment: (NOTE) Fact Sheet for Patients: EntrepreneurPulse.com.au  Fact Sheet for Healthcare Providers: IncredibleEmployment.be  This test is not yet approved or cleared by the [place] FDA and has been authorized for detection and/or diagnosis of SARS-CoV-2 by FDA under an Emergency Use Authorization (EUA). This EUA will remain in effect (meaning this test can be used) for the duration of the COVID-19 declaration under Section 564(b)(1) of the Act, 21 U.S.C. section 360bbb-3(b)(1), unless the authorization is terminated or revoked.  Performed at [facility], [place] [address], [place], [place] [address]   Troponin I (High Sensitivity)     Status: Abnormal   Collection Time: 04/24/22  8:45 PM  Result Value Ref Range   Troponin I (High Sensitivity) 46 (H) <18 ng/L    Comment: DELTA CHECK NOTED (NOTE) Elevated high sensitivity troponin I (hsTnI) values and significant  changes across serial measurements may suggest ACS but many other  chronic and acute conditions are known to elevate hsTnI results.  Refer to the "Links" section for chest pain  algorithms and additional  guidance. Performed at [facility], [place] [address] [place], [place] [address]   D-dimer, quantitative     Status: Abnormal   Collection Time: 04/24/22  8:45 PM  Result Value Ref Range   D-Dimer, Quant 10.00 (H) 0.00 - 0.50 ug/mL-FEU    Comment: (NOTE) At the manufacturer cut-off value of 0.5 g/mL FEU, this assay has a negative predictive value of 95-100%.This assay is intended for use in conjunction with a clinical pretest probability (PTP) assessment model to exclude pulmonary embolism (PE) and deep venous thrombosis (DVT) in outpatients suspected of PE or DVT. Results should be correlated with clinical presentation. Performed at [facility], [place] [address] [place], [place] [address]   CBC with Differential/Platelet     Status: Abnormal   Collection Time: 04/25/22  2:50 AM  Result Value Ref Range   WBC 5.7 4.0 - 10.5 K/uL   RBC 3.57 (L) 3.87 - 5.11 MIL/uL   Hemoglobin 11.9 (L) 12.0 - 15.0 g/dL   HCT 35.8 (L) 36.0 - 46.0 %   MCV 100.3 (H) 80.0 - 100.0 fL   MCH 33.3 26.0 - 34.0 pg   MCHC 33.2 30.0 - 36.0 g/dL   RDW 15.8 (H) 11.5 - 15.5 %   Platelets 142 (L) 150 - 400 K/uL   nRBC 1.2 (H) 0.0 - 0.2 %   Neutrophils Relative % 47 %   Neutro Abs 2.7 1.7 - 7.7 K/uL   Lymphocytes Relative 39 %   Lymphs Abs 2.2 0.7 - 4.0 K/uL   Monocytes Relative 9 %   Monocytes Absolute 0.5 0.1 - 1.0 K/uL   Eosinophils Relative 4 %   Eosinophils Absolute 0.3 0.0 - 0.5 K/uL   Basophils Relative 1 %   Basophils Absolute 0.0 0.0 - 0.1 K/uL   Immature Granulocytes 0 %   Abs Immature Granulocytes 0.02 0.00 - 0.07 K/uL    Comment: Performed at [facility], [place] [address] [place], [place]  [address]  Comprehensive metabolic panel     Status: Abnormal   Collection Time: 04/25/22  2:50 AM  Result Value Ref Range   Sodium 141 135 - 145 mmol/L   Potassium 3.2 (L) 3.5 - 5.1 mmol/L   Chloride 105 98 - 111 mmol/L   CO2 26 22 - 32 mmol/L   Glucose, Bld  130 (H) 70 - 99 mg/dL    Comment: Glucose reference range applies only to samples taken after fasting for at least 8 hours.   BUN 9 6 - 20 mg/dL   Creatinine, Ser 1.13 (H) 0.44 - 1.00 mg/dL   Calcium 9.1 8.9 - 10.3 mg/dL   Total Protein 6.7 6.5 - 8.1 g/dL   Albumin 3.9 3.5 - 5.0 g/dL   AST 43 (H) 15 - 41 U/L   ALT 50 (H) 0 - 44 U/L   Alkaline Phosphatase 54 38 - 126 U/L   Total Bilirubin 0.6 0.3 - 1.2 mg/dL   GFR, Estimated 57 (L) >60 mL/min    Comment: (NOTE) Calculated using the CKD-EPI Creatinine Equation (2021)    Anion gap 10 5 - 15    Comment: Performed at [facility] [address], [place], [place] [address]  Magnesium     Status: None   Collection Time: 04/25/22  2:50 AM  Result Value Ref Range   Magnesium 2.0 1.7 - 2.4 mg/dL    Comment: Performed at [facility] [address], [place], [place] [address]  Phosphorus     Status: None   Collection Time: 04/25/22  2:50 AM  Result Value Ref Range   Phosphorus 3.8 2.5 - 4.6 mg/dL    Comment: Performed at [facility] [address], [place], [place] [address]  Brain natriuretic peptide     Status: Abnormal   Collection Time: 04/25/22  2:50 AM  Result Value Ref Range   B Natriuretic Peptide 364.8 (H) 0.0 - 100.0 pg/mL    Comment: Performed at [facility] [address], [place], [place] [address]  Procalcitonin     Status: None   Collection Time: 04/25/22  2:50 AM  Result Value Ref Range   Procalcitonin <0.10 ng/mL    Comment:        Interpretation: PCT (Procalcitonin) <= 0.5 ng/mL: Systemic infection (sepsis) is not likely. Local bacterial infection is possible. (NOTE)       Sepsis PCT Algorithm           Lower Respiratory Tract                                      Infection PCT Algorithm    ----------------------------     ----------------------------         PCT < 0.25 ng/mL                PCT < 0.10 ng/mL          Strongly encourage             Strongly discourage   discontinuation  of antibiotics    initiation of antibiotics    ----------------------------     -----------------------------       PCT 0.25 - 0.50 ng/mL            PCT 0.10 - 0.25 ng/mL               OR       >  80% decrease in PCT            Discourage initiation of                                            antibiotics      Encourage discontinuation           of antibiotics    ----------------------------     -----------------------------         PCT >= 0.50 ng/mL              PCT 0.26 - 0.50 ng/mL               AND        <80% decrease in PCT             Encourage initiation of                                             antibiotics       Encourage continuation           of antibiotics    ----------------------------     -----------------------------        PCT >= 0.50 ng/mL                  PCT > 0.50 ng/mL               AND         increase in PCT                  Strongly encourage                                      initiation of antibiotics    Strongly encourage escalation           of antibiotics                                     -----------------------------                                           PCT <= 0.25 ng/mL                                                 OR                                        > 80% decrease in PCT                                      Discontinue / Do not initiate  antibiotics  Performed at [facility], [place] [address], [place], [place] [address]   Lactic acid, plasma     Status: None   Collection Time: 04/25/22  4:30 AM  Result Value Ref Range   Lactic Acid, Venous 1.6 0.5 - 1.9 mmol/L    Comment: Performed at [facility] [address], [place], [place] [address]  Troponin I (High Sensitivity)     Status: Abnormal   Collection Time: 04/25/22  4:30 AM  Result Value Ref Range   Troponin I (High Sensitivity) 23 (H) <18 ng/L    Comment: (NOTE) Elevated high sensitivity troponin I (hsTnI) values  and significant  changes across serial measurements may suggest ACS but many other  chronic and acute conditions are known to elevate hsTnI results.  Refer to the "Links" section for chest pain algorithms and additional  guidance. Performed at [facility], [place] [address] [place], [place] [address]   I-Stat venous blood gas, ED     Status: Abnormal   Collection Time: 04/25/22  5:24 AM  Result Value Ref Range   pH, Ven 7.483 (H) 7.25 - 7.43   pCO2, Ven 34.7 (L) 44 - 60 mmHg   pO2, Ven 180 (H) 32 - 45 mmHg   Bicarbonate 26.0 20.0 - 28.0 mmol/L   TCO2 27 22 - 32 mmol/L   O2 Saturation 100 %   Acid-Base Excess 3.0 (H) 0.0 - 2.0 mmol/L   Sodium 142 135 - 145 mmol/L   Potassium 3.5 3.5 - 5.1 mmol/L   Calcium, Ion 1.12 (L) 1.15 - 1.40 mmol/L   HCT 37.0 36.0 - 46.0 %   Hemoglobin 12.6 12.0 - 15.0 g/dL   Sample type VENOUS   Heparin level (unfractionated)     Status: None   Collection Time: 04/25/22 11:26 AM  Result Value Ref Range   Heparin Unfractionated 0.52 0.30 - 0.70 IU/mL    Comment: (NOTE) The clinical reportable range upper limit is being lowered to >1.10 to align with the FDA approved guidance for the current laboratory assay.  If heparin results are below expected values, and patient dosage has  been confirmed, suggest follow up testing of antithrombin III levels. Performed at [facility], [place] [address], [place], [place] [address]    VAS [name] LOWER EXTREMITY VENOUS (DVT)  Result Date: 04/25/2022  Lower Venous DVT Study Patient Name:  ALISAN VERHOFF  Date of Exam:   04/25/2022 Medical Rec #: [id number]           Accession #:    [id number] Date of Birth: 1965-10-28           Patient Gender: F Patient Age:   5 years Exam Location:  [facility] Procedure:      VAS [name] LOWER EXTREMITY VENOUS (DVT) Referring Phys: Eddie Dibbles HOFFMAN --------------------------------------------------------------------------------  Indications: Pulmonary embolism, SOB, and Elevated  D-Dimer.  Risk Factors: Surgery January 2024. Anticoagulation: Heparin. Limitations: Body habitus. Comparison Study: No prior study. Performing Technologist: McKayla Maag RVT, [place]  Examination Guidelines: A complete evaluation includes B-mode imaging, spectral Doppler, color Doppler, and power Doppler as needed of all accessible portions of each vessel. Bilateral testing is considered an integral part of a complete examination. Limited examinations for reoccurring indications may be performed as noted. The reflux portion of the exam is performed with the patient in reverse Trendelenburg.  +---------+---------------+---------+-----------+----------+--------------+ RIGHT    CompressibilityPhasicitySpontaneityPropertiesThrombus Aging +---------+---------------+---------+-----------+----------+--------------+ CFV      Full  Yes      Yes                                 +---------+---------------+---------+-----------+----------+--------------+ SFJ      Full                                                        +---------+---------------+---------+-----------+----------+--------------+ FV Prox  Full                                                        +---------+---------------+---------+-----------+----------+--------------+ FV Mid   Full                                                        +---------+---------------+---------+-----------+----------+--------------+ FV DistalFull                                                        +---------+---------------+---------+-----------+----------+--------------+ PFV      Full                                                        +---------+---------------+---------+-----------+----------+--------------+ POP      Full           Yes      Yes                                 +---------+---------------+---------+-----------+----------+--------------+ PTV      Full                                                         +---------+---------------+---------+-----------+----------+--------------+ PERO     Full                                                        +---------+---------------+---------+-----------+----------+--------------+   +---------+---------------+---------+-----------+----------+-------------------+ LEFT     CompressibilityPhasicitySpontaneityPropertiesThrombus Aging      +---------+---------------+---------+-----------+----------+-------------------+ CFV      Full           Yes      Yes                                      +---------+---------------+---------+-----------+----------+-------------------+  SFJ      Full                                                             +---------+---------------+---------+-----------+----------+-------------------+ FV Prox  Full                                                             +---------+---------------+---------+-----------+----------+-------------------+ FV Mid   Full                                                             +---------+---------------+---------+-----------+----------+-------------------+ FV Distal               Yes      Yes                                      +---------+---------------+---------+-----------+----------+-------------------+ PFV      Full                                                             +---------+---------------+---------+-----------+----------+-------------------+ POP      Full           Yes      Yes                                      +---------+---------------+---------+-----------+----------+-------------------+ PTV      Partial        Yes      Yes                  Acute               +---------+---------------+---------+-----------+----------+-------------------+ PERO                                                  Not visualized due                                                        to vessel depth      +---------+---------------+---------+-----------+----------+-------------------+     Summary: RIGHT: - There is no evidence of deep vein thrombosis in the lower extremity.  - No cystic structure found in the popliteal fossa.  LEFT: - Findings consistent with acute deep vein thrombosis  involving the left posterior tibial veins. - No cystic structure found in the popliteal fossa.  *See table(s) above for measurements and observations. Electronically signed by Deitra Mayo MD on 04/25/2022 at 2:50:57 PM.    Final    ECHOCARDIOGRAM COMPLETE BUBBLE STUDY  Result Date: 04/25/2022    ECHOCARDIOGRAM REPORT   Patient Name:   RENATHA MUSHINSKI Date of Exam: 04/25/2022 Medical Rec #:  [id number]          Height:       71.0 in Accession #:    [id number]         Weight:       339.0 lb Date of Birth:  03/16/1965          BSA:          2.639 m Patient Age:    30 years           BP:           108/75 mmHg Patient Gender: F                  HR:           75 bpm. Exam Location:  Inpatient Procedure: 2D Echo, Cardiac Doppler, Color Doppler, Intracardiac Opacification            Agent and Saline Contrast Bubble Study STAT ECHO Indications:    Pulmonary embolism  History:        Patient has prior history of Echocardiogram examinations, most                 recent 09/28/2020. Pulmonary HTN; Risk Factors:Hypertension.                 Sarcoid, cardiac.  Sonographer:    Eartha Inch Referring Phys: [id number] JASON MESNER  Sonographer Comments: Technically difficult study due to poor echo windows and patient is obese. Image acquisition challenging due to patient body habitus and Image acquisition challenging due to respiratory motion. IMPRESSIONS  1. Left ventricular ejection fraction, by estimation, is 65 to 70%. The left ventricle has hyperdynamic function. The left ventricle has no regional wall motion abnormalities. There is mild concentric left ventricular hypertrophy. Left ventricular diastolic parameters were normal.  2. Right  ventricular systolic function is moderately reduced. The right ventricular size is mildly enlarged. Tricuspid regurgitation signal is inadequate for assessing PA pressure.  3. Left atrial size was mildly dilated.  4. Right atrial size was mildly dilated.  5. The mitral valve is normal in structure. No evidence of mitral valve regurgitation.  6. The aortic valve is tricuspid. There is mild thickening of the aortic valve. Aortic valve regurgitation is not visualized. Aortic valve sclerosis is present, with no evidence of aortic valve stenosis.  7. The inferior vena cava is dilated in size with >50% respiratory variability, suggesting right atrial pressure of 8 mmHg.  8. Agitated saline contrast bubble study was negative, with no evidence of any interatrial shunt. Comparison(s): Prior images reviewed side by side. The right ventricular systolic function is significantly worse. FINDINGS  Left Ventricle: Left ventricular ejection fraction, by estimation, is 65 to 70%. The left ventricle has hyperdynamic function. The left ventricle has no regional wall motion abnormalities. Definity contrast agent was given IV to delineate the left ventricular endocardial borders. The left ventricular internal cavity size was normal in size. There is mild concentric left ventricular hypertrophy. Left ventricular diastolic parameters were normal. Right Ventricle: McConnell's sign is present, consistent with hemodynamically significant pulmonary embolism.  The right ventricular size is mildly enlarged. No increase in right ventricular wall thickness. Right ventricular systolic function is moderately reduced. Tricuspid regurgitation signal is inadequate for assessing PA pressure. Left Atrium: Left atrial size was mildly dilated. Right Atrium: Right atrial size was mildly dilated. Pericardium: Trivial pericardial effusion is present. Mitral Valve: The mitral valve is normal in structure. No evidence of mitral valve regurgitation. MV peak  gradient, 2.0 mmHg. The mean mitral valve gradient is 1.0 mmHg. Tricuspid Valve: The tricuspid valve is normal in structure. Tricuspid valve regurgitation is trivial. Aortic Valve: The aortic valve is tricuspid. There is mild thickening of the aortic valve. Aortic valve regurgitation is not visualized. Aortic valve sclerosis is present, with no evidence of aortic valve stenosis. Pulmonic Valve: The pulmonic valve was normal in structure. Pulmonic valve regurgitation is trivial. No evidence of pulmonic stenosis. Aorta: The aortic root and ascending aorta are structurally normal, with no evidence of dilitation. Venous: The inferior vena cava is dilated in size with greater than 50% respiratory variability, suggesting right atrial pressure of 8 mmHg. IAS/Shunts: No atrial level shunt detected by color flow Doppler. Agitated saline contrast was given intravenously to evaluate for intracardiac shunting. Agitated saline contrast bubble study was negative, with no evidence of any interatrial shunt.  LEFT VENTRICLE PLAX 2D LVIDd:         4.20 cm   Diastology LVIDs:         2.80 cm   LV e' medial:    9.68 cm/s LV PW:         1.20 cm   LV E/e' medial:  5.2 LV IVS:        1.30 cm   LV e' lateral:   9.79 cm/s LVOT diam:     2.20 cm   LV E/e' lateral: 5.1 LV SV:         90 LV SV Index:   34 LVOT Area:     3.80 cm  RIGHT VENTRICLE            IVC RV S prime:     7.29 cm/s  IVC diam: 2.10 cm TAPSE (M-mode): 1.5 cm LEFT ATRIUM             Index        RIGHT ATRIUM           Index LA diam:        3.90 cm 1.48 cm/m   RA Area:     19.70 cm LA Vol (A2C):   52.4 ml 19.86 ml/m  RA Volume:   64.20 ml  24.33 ml/m LA Vol (A4C):   58.2 ml 22.06 ml/m LA Biplane Vol: 56.6 ml 21.45 ml/m  AORTIC VALVE LVOT Vmax:   126.00 cm/s LVOT Vmean:  82.900 cm/s LVOT VTI:    0.236 m  AORTA Ao Root diam: 3.10 cm Ao Asc diam:  3.40 cm MITRAL VALVE MV Area (PHT): 2.25 cm    SHUNTS MV Area VTI:   3.80 cm    Systemic VTI:  0.24 m MV Peak grad:  2.0 mmHg     Systemic Diam: 2.20 cm MV Mean grad:  1.0 mmHg MV Vmax:       0.71 m/s MV Vmean:      47.4 cm/s MV Decel Time: 337 msec MV E velocity: 49.90 cm/s MV A velocity: 51.60 cm/s MV E/A ratio:  0.97 Mihai Croitoru MD Electronically signed by Sanda Klein MD Signature Date/Time: 04/25/2022/9:05:01 AM    Final  CT Angio Chest PE W and/or Wo Contrast  Result Date: 04/25/2022 CLINICAL DATA:  Pulmonary embolism (PE) suspected, low to intermediate prob, positive D-dimer. Sarcoidosis EXAM: CT ANGIOGRAPHY CHEST WITH CONTRAST TECHNIQUE: Multidetector CT imaging of the chest was performed using the standard protocol during bolus administration of intravenous contrast. Multiplanar CT image reconstructions and MIPs were obtained to evaluate the vascular anatomy. RADIATION DOSE REDUCTION: This exam was performed according to the departmental dose-optimization program which includes automated exposure control, adjustment of the mA and/or kV according to patient size and/or use of iterative reconstruction technique. CONTRAST:  42m OMNIPAQUE IOHEXOL 350 MG/ML SOLN COMPARISON:  07/16/2021 FINDINGS: Cardiovascular: There is adequate opacification of the pulmonary arterial tree. There are multiple branching intraluminal filling defects within the lobar and segmental pulmonary arteries bilaterally in keeping with acute pulmonary embolus. The embolic burden is moderate. Central pulmonary arteries are enlarged in keeping with changes of pulmonary arterial hypertension. There is relative enlargement of the right ventricle and reversal of the normal RV/LV ratio (RV/LV equals 1.47) in keeping with changes of right heart strain. No significant coronary artery calcification. Global cardiac size within normal limits. No pericardial effusion. The thoracic aorta is unremarkable. Mediastinum/Nodes: Visualized thyroid is unremarkable. No pathologic thoracic adenopathy. Esophagus unremarkable. Lungs/Pleura: Evaluation of the pulmonary parenchyma  is limited by respiratory motion artifact. Minimal scattered nodular infiltrates are seen within the upper lobes bilaterally which may be infectious or inflammatory in the acute setting. No pneumothorax or pleural effusion. Upper Abdomen: No acute abnormality. Musculoskeletal: No chest wall abnormality. No acute or significant osseous findings. Review of the MIP images confirms the above findings. IMPRESSION: 1. Acute pulmonary embolus with moderate embolic burden and CT evidence of right heart strain (RV/LV equals 1.47). 2. Minimal scattered nodular infiltrates within the upper lobes bilaterally which may be infectious or inflammatory in the acute setting. These results were called by telephone at the time of interpretation on 04/25/2022 at 12:42 am to provider Mesner, MD, who verbally acknowledged these results. Electronically Signed   By: AFidela SalisburyM.D.   On: 04/25/2022 00:42   DG Chest 2 View  Result Date: 04/24/2022 CLINICAL DATA:  Short of breath EXAM: CHEST - 2 VIEW COMPARISON:  None Available. FINDINGS: Normal mediastinum and cardiac silhouette. Low lung volumes. Central venous congestion. No focal consolidation. No pneumothorax. IMPRESSION: Low lung volumes and central venous congestion. Electronically Signed   By: SSuzy BouchardM.D.   On: 04/24/2022 17:59    Pending Labs Unresulted Labs (From admission, onward)     Start     Ordered   04/25/22 1800  Heparin level (unfractionated)  Once-Timed,   TIMED       Question:  Specimen collection method  Answer:  Lab=Lab collect   04/25/22 1248   04/25/22 0500  Blood gas, venous  Tomorrow morning,   R       Question:  Specimen collection method  Answer:  Lab=Lab collect   04/25/22 0243            Vitals/Pain Today's Vitals   04/25/22 1500 04/25/22 1522 04/25/22 1600 04/25/22 1700  BP: 131/89  110/80 (Abnormal) 101/51  Pulse: 81  69 84  Resp: 15  20 (Abnormal) 27  Temp:  98.5 F (36.9 C)    TempSrc:  Oral    SpO2: 98%  100% 98%   Weight:      Height:      PainSc:        Isolation Precautions No active isolations  Medications Medications  oxyCODONE (Oxy IR/ROXICODONE) immediate release tablet 5 mg (5 mg Oral Not Given 04/24/22 2109)  heparin ADULT infusion 100 units/mL (25000 units/273m) (1,500 Units/hr Intravenous New Bag/Given 04/25/22 1241)  acetaminophen (TYLENOL) tablet 650 mg (has no administration in time range)    Or  acetaminophen (TYLENOL) suppository 650 mg (has no administration in time range)  melatonin tablet 3 mg (has no administration in time range)  lactated ringers infusion (0 mLs Intravenous Stopped 04/25/22 1243)  brimonidine (ALPHAGAN) 0.2 % ophthalmic solution 1 drop (1 drop Both Eyes Given 04/25/22 1036)  dorzolamide (TRUSOPT) 2 % ophthalmic solution 1 drop (1 drop Both Eyes Given 04/25/22 1242)  latanoprost (XALATAN) 0.005 % ophthalmic solution 1 drop (has no administration in time range)  pantoprazole (PROTONIX) EC tablet 40 mg (40 mg Oral Given 04/25/22 0700)  potassium chloride SA (KLOR-CON M) CR tablet 20 mEq (20 mEq Oral Given 04/25/22 1035)  oxyCODONE-acetaminophen (PERCOCET/ROXICET) 5-325 MG per tablet 1 tablet (has no administration in time range)  traZODone (DESYREL) tablet 50 mg (50 mg Oral Given 04/25/22 0333)  naloxone (NARCAN) injection 0.4 mg (has no administration in time range)  perflutren lipid microspheres (DEFINITY) IV suspension (2 mLs Intravenous Given 04/25/22 0845)  Difluprednate 0.05 % EMUL 1 drop (has no administration in time range)  cyclobenzaprine (FLEXERIL) tablet 10 mg (has no administration in time range)  clonazePAM (KLONOPIN) tablet 0.5 mg (has no administration in time range)  sertraline (ZOLOFT) tablet 200 mg (200 mg Oral Not Given 04/25/22 1551)  methotrexate (RHEUMATREX) tablet 25 mg (has no administration in time range)  potassium chloride SA (KLOR-CON M) CR tablet 40 mEq (40 mEq Oral Given 04/24/22 1922)  naproxen (NAPROSYN) tablet 500 mg (500 mg Oral Given  04/24/22 2043)  oxyCODONE-acetaminophen (PERCOCET/ROXICET) 5-325 MG per tablet 1 tablet (1 tablet Oral Given 04/24/22 2043)  aspirin chewable tablet 324 mg (324 mg Oral Given 04/24/22 2323)  iohexol (OMNIPAQUE) 350 MG/ML injection 90 mL (90 mLs Intravenous Contrast Given 04/25/22 0013)  heparin bolus via infusion 7,000 Units (7,000 Units Intravenous Bolus from Bag 04/25/22 0124)  potassium chloride SA (KLOR-CON M) CR tablet 40 mEq (40 mEq Oral Given 04/25/22 0333)    Mobility walks with person assist     Focused Assessments Pulmonary Assessment Handoff:  Lung sounds: Bilateral Breath Sounds: Clear O2 Device: Nasal Cannula O2 Flow Rate (L/min): 2 L/min    R Recommendations: See Admitting Provider Note  Report given to:   Additional Notes: Has a PE, 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 Patient also has her home meds that need to be sent to pharmacy so they can be dispensed from there.

## 2022-04-25 NOTE — Progress Notes (Signed)
[place] for Heparin  Indication: pulmonary embolus  No Known Allergies  Patient Measurements: Height: '5\' 11"'$  (180.3 cm) Weight: (!) 153.8 kg (339 lb) IBW/kg (Calculated) : 70.8  Vital Signs: Temp: 98.1 F (36.7 C) (02/27 1045) Temp Source: Oral (02/27 1045) BP: 123/78 (02/27 1045) Pulse Rate: 96 (02/27 1045)  Labs: Recent Labs    04/24/22 1659 04/24/22 2045 04/25/22 0250 04/25/22 0430 04/25/22 0524 04/25/22 1126  HGB 12.5  --  11.9*  --  12.6  --   HCT 37.4  --  35.8*  --  37.0  --   PLT 147*  --  142*  --   --   --   HEPARINUNFRC  --   --   --   --   --  0.52  CREATININE 1.05*  --  1.13*  --   --   --   TROPONINIHS 27* 46*  --  23*  --   --      Estimated Creatinine Clearance: 92.4 mL/min (A) (by C-G formula based on SCr of 1.13 mg/dL (H)).   Medical History: Past Medical History:  Diagnosis Date   Acute medial meniscus tear    Anxiety    Bipolar 1 disorder (HCC)    Depression    Fibromyalgia    H/O spinal cord injury    T, L ans CSpine   Hypertension    Menopause    Pulmonary hypertension (HCC)    Sarcoid, cardiac     Assessment: 57 y/o F with progressive shortness of breath for 2 weeks, found to have new onset PE on CT angio, starting heparin, labs above reviewed, PTA meds reviewed.   Heparin level 0.52 units/mL (therapeutic) on heparin 1500 units/hr. CBC stable. Plt stable but low in the 140s. No signs of bleeding.  Goal of Therapy:  Heparin level 0.3-0.7 units/ml Monitor platelets by anticoagulation protocol: Yes   Plan:  Continue heparin at 1500 units/hr Check confirmatory heparin level at 1800 Daily CBC and heparin level F/u transition to oral agent  Erskine Speed, PharmD Clinical Pharmacist

## 2022-04-25 NOTE — Assessment & Plan Note (Addendum)
-   LE duplex obtained: left posterior tibial vein - continue anticoagulation

## 2022-04-25 NOTE — H&P (Signed)
History and Physical      Julie Cruz [id number] DOB: 1966-01-19 DOA: 04/24/2022  PCP: Lin Landsman, MD  Patient coming from: home   I have personally briefly reviewed patient's old medical records in [place]  Chief Complaint: Shortness of breath  HPI: Julie Cruz is a 57 y.o. female with medical history significant for cardiac sarcoidosis, generalized anxiety disorder, essential hypertension, chronic pain syndrome, who is admitted to [facility] on 04/24/2022 with acute bilateral pulmonary emboli after presenting from home to [facility] ED complaining of shortness of breath.   The patient reports 3 to 4 days of progressive shortness of breath in the absence of any associated orthopnea, PND, or worsening of peripheral edema.  Denies any associated chest pain, palpitations, diaphoresis, nausea, vomiting, dizziness, presyncope, or syncope.  She also denies any associated wheezing, mopped assist, or new onset cough.  Denies any associated subjective fever, chills, rigors, generalized myalgias.  Denies any new lower extremity edema, erythema, or calf tenderness.    Denies any personal of family history of DVT or PE, and no personal or family history of inheritable hypercoagulable state.  She notes a recent surgical procedure, noting surgery on her eye in January 2024.  Otherwise, denies any recent/preceding surgical procedures. No recent periods of diminished ambulatory activity. Denies any recent travel via airplane or prolonged car trips.  Is a former smoker, but completely quit smoking several years ago.  Denies any use of estrogen replacement therapy, or use of OCPs. Not on any anti-coagulant or anti-platelet medications at home, including no aspirin. No history of GI bleed.  She has a history of sarcoidosis for which she is on methotrexate, but notes that this is primarily cardiac in nature, without significant pulmonary involvement.   Denies any known baseline  supplemental oxygen requirements.   ED Course:  Vital signs in the ED were notable for the following: Afebrile; heart rate [id number]; systolic blood pressures in the 110s to 140s; respiratory rate 18-24, initial oxygen saturation in the mid 80s, [place] improving into the range of 96 to 97% on 2 L.  Labs were notable for the following: CMP notable for following: Sodium 141 potassium 3.0 bicarbonate 26, creatinine 105.  BNP 314, without any prior BMP data points available to comparison.  High sensitive troponin I initially noted to be 27, very high trending up slightly at 46, relative to most recent prior high sensitive troponin I value of 5 in May 2023.  D-dimer 10.  CBC notable for white cell count 5100, hemoglobin 12.5, blood count 147.  COVID, influenza, RSV PCR all negative.  Per my interpretation, EKG in ED demonstrated the following: Sinus rhythm with a rate 98, prolonged QTc of 538, no evidence of T wave or ST changes, clearness of ST elevation.  Imaging and additional notable ED work-up: CTA chest with PE protocol, per formal radiology read, shows acute bilateral pulmonary emboli with moderate embolic burden, and CT evidence of right heart strain, also showing minimal scattered nodular infiltrates within the bilateral upper lobes, suggestive of inflammatory reaction versus infectious etiology, will demonstrated no evidence of edema, effusion, or pneumothorax.  EDP discussed patient's case with on-call PCCM, Who recommended admission to the hospitalist service, including for initiation of heparin drip.  PCCM conveys that they will formally consult and see the patient, with additional recommendations pending at this time.  While in the ED, the following were administered: Heparin bolus followed by initiation of heparin drip.  In the setting  of mildly elevated troponin, the patient also received full dose aspirin x 1.  Percocet 5/325 mg p.o. x 1, potassium chloride 40 mill colons p.o. x 1  dose.  Subsequently, the patient was admitted for further evaluation management presenting acute bilateral pulmonary emboli, with CTA evidence of right heart strain, complicated by acute hypoxic respiratory distress, mildly elevated troponin, QTc prolongation and hypokalemia.     Review of Systems: As per HPI otherwise 10 point review of systems negative.   Past Medical History:  Diagnosis Date   Acute medial meniscus tear    Anxiety    Bipolar 1 disorder (HCC)    Depression    Fibromyalgia    H/O spinal cord injury    T, L ans CSpine   Hypertension    Menopause    Pulmonary hypertension (HCC)    Sarcoid, cardiac     Past Surgical History:  Procedure Laterality Date   C Section     Catherization      x2   COLONOSCOPY WITH PROPOFOL N/A 10/15/2020   Procedure: COLONOSCOPY WITH PROPOFOL;  Surgeon: Carol Ada, MD;  Location: [facility] ENDOSCOPY;  Service: Endoscopy;  Laterality: N/A;   Etopic  pregnacy     LUNG BIOPSY     TUBAL LIGATION      Social History:  reports that she quit smoking about 17 years ago. Her smoking use included cigarettes. She has never used smokeless tobacco. She reports current alcohol use of about 2.0 standard drinks of alcohol per week. She reports that she does not use drugs.   No Known Allergies  Family History  Problem Relation Age of Onset   Hyperlipidemia Mother    Asthma Mother    Thyroid disease Mother    Depression Mother    Rheum arthritis Mother    Cancer Father        Pancreatic Cancer   Diabetes Father    Heart attack Brother    Depression Brother    HIV/AIDS Brother     Family history reviewed and not pertinent    Prior to Admission medications   Medication Sig Start Date End Date Taking? Authorizing Provider  acetaminophen (TYLENOL) 650 MG CR tablet Take 650 mg by mouth every 8 (eight) hours as needed for pain.    [provider]  acetaZOLAMIDE (DIAMOX) 250 MG tablet Take 500 mg by mouth 2 (two) times daily.     [provider]  albuterol (VENTOLIN HFA) 108 (90 Base) MCG/ACT inhaler Inhale 2 puffs into the lungs every 6 (six) hours as needed for wheezing or shortness of breath. 10/06/20 01/26/22  Freddi Starr, MD  aspirin 325 MG tablet Take 325 mg by mouth daily as needed (pain/chest tightness).    [provider]  brimonidine (ALPHAGAN P) 0.1 % SOLN Place 1 drop into both eyes in the morning and at bedtime.    [provider]  clonazePAM (KLONOPIN) 0.5 MG tablet Take 0.5 mg by mouth daily.    [provider]  cyclobenzaprine (FLEXERIL) 10 MG tablet Take 10-20 mg by mouth 3 (three) times daily as needed for muscle spasms.    [provider]  dorzolamide (TRUSOPT) 2 % ophthalmic solution  04/07/21   [provider]  folic acid (FOLVITE) 1 MG tablet Take 1 tablet by mouth daily. 01/03/22   [provider]  hydrochlorothiazide (HYDRODIURIL) 50 MG tablet Take 50 mg by mouth in the morning. 06/09/20   [provider]  ipratropium-albuterol (DUONEB) 0.5-2.5 (3)  MG/3ML SOLN Take 3 mLs by nebulization every 6 (six) hours as needed. 01/26/22   Freddi Starr, MD  lisinopril (ZESTRIL) 40 MG tablet Take 40 mg by mouth in the morning. 06/09/20   [provider]  LUMIGAN 0.01 % SOLN Place 1 drop into both eyes at bedtime. 05/11/21   [provider]  Menthol (ICY HOT) 5 % PTCH Place 1 patch onto the skin daily as needed (pain.).    [provider]  Menthol, Topical Analgesic, (ICY HOT EX) Apply 1 application topically 3 (three) times daily as needed (pain.).    [provider]  methocarbamol (ROBAXIN) 750 MG tablet Take 750 mg by mouth every 8 (eight) hours as needed for muscle spasms. PRN    [provider]  Methotrexate Sodium (METHOTREXATE, PF,) 250 MG/10ML injection  07/21/21   [provider]  naproxen (NAPROSYN) 500 MG tablet Take 1,000 mg by mouth daily as needed (pain.).    [provider]  Netarsudil Dimesylate (RHOPRESSA) 0.02 % SOLN Apply 1 drop to eye at bedtime.    [provider]  oxyCODONE-acetaminophen (PERCOCET) 10-325 MG tablet Take 1 tablet by mouth 3 (three) times daily. 04/29/21   [provider]  pantoprazole (PROTONIX) 40 MG tablet Take 40 mg by mouth in the morning. 06/09/20   [provider]  potassium chloride (KLOR-CON) 10 MEQ tablet Take 20 mEq by mouth in the morning. 06/09/20   [provider]  prednisoLONE acetate (PRED FORTE) 1 % ophthalmic suspension Place 1 drop into both eyes See admin instructions. Instill 1 drop into right eye scheduled twice daily for inflammation/pain & instil 1 drop into left eye up to twice daily if needed for redness/inflammation/pain.    [provider]  sertraline (ZOLOFT) 100 MG tablet Take 200 mg by mouth in the morning. 06/09/20   [provider]  traZODone (DESYREL) 50 MG tablet Take 50 mg by mouth at bedtime.    [provider]  verapamil (VERELAN PM) 360 MG 24 hr capsule Take 360 mg by mouth in the morning. 06/09/20   [provider]     Objective    Physical Exam: Vitals:   04/24/22 2145 04/24/22 2330 04/24/22 2345 04/25/22 0100  BP: 118/75 110/75 (!) 112/46   Pulse: 99 91 81   Resp: (!) 22 (!) 23 (!) 24   Temp:    98.7 F (37.1 C)  TempSrc:      SpO2: 97% 93% 93%   Weight:      Height:        General: appears to be stated age; alert, oriented; mildly increased work of breathing noted Skin: warm, dry, no rash Head:  AT/[place] Mouth:  Oral mucosa membranes appear moist, normal dentition Neck: supple; trachea midline Heart:  RRR; did not appreciate any M/R/G Lungs: CTAB, did not appreciate any wheezes, rales, or rhonchi Abdomen: + BS; soft, ND, NT Vascular: 2+ pedal pulses b/l; 2+ radial pulses b/l Extremities: no peripheral edema, no muscle wasting Neuro: strength and sensation intact in upper and lower extremities  b/l     Labs on Admission: I have personally reviewed following labs and imaging studies  CBC: Recent Labs  Lab 04/24/22 1659  WBC 5.1  NEUTROABS 2.4  HGB 12.5  HCT 37.4  MCV 100.5*  PLT [id number]*   Basic Metabolic Panel: Recent Labs  Lab 04/24/22 1659  NA 141  K 3.1*  CL 101  CO2 26  GLUCOSE 116*  BUN  7  CREATININE 1.05*  CALCIUM 9.5   GFR: Estimated Creatinine Clearance: 99.4 mL/min (A) (by C-G formula based on SCr of 1.05 mg/dL (H)). Liver Function Tests: No results for input(s): "AST", "ALT", "ALKPHOS", "BILITOT", "PROT", "ALBUMIN" in the last 168 hours. No results for input(s): "LIPASE", "AMYLASE" in the last 168 hours. No results for input(s): "AMMONIA" in the last 168 hours. Coagulation Profile: No results for input(s): "INR", "PROTIME" in the last 168 hours. Cardiac Enzymes: No results for input(s): "CKTOTAL", "CKMB", "CKMBINDEX", "TROPONINI" in the last 168 hours. BNP (last 3 results) No results for input(s): "PROBNP" in the last 8760 hours. HbA1C: No results for input(s): "HGBA1C" in the last 72 hours. CBG: Recent Labs  Lab 04/24/22 1758  GLUCAP 101*   Lipid Profile: No results for input(s): "CHOL", "HDL", "LDLCALC", "TRIG", "CHOLHDL", "LDLDIRECT" in the last 72 hours. Thyroid Function Tests: No results for input(s): "TSH", "T4TOTAL", "FREET4", "T3FREE", "THYROIDAB" in the last 72 hours. Anemia Panel: No results for input(s): "VITAMINB12", "FOLATE", "FERRITIN", "TIBC", "IRON", "RETICCTPCT" in the last 72 hours. Urine analysis:    Component Value Date/Time   COLORURINE YELLOW 07/16/2021 1557   APPEARANCEUR HAZY (A) 07/16/2021 1557   LABSPEC 1.015 07/16/2021 1557   PHURINE 7.0 07/16/2021 1557   GLUCOSEU NEGATIVE 07/16/2021 1557   HGBUR NEGATIVE 07/16/2021 [place] 07/16/2021 1557   KETONESUR NEGATIVE 07/16/2021 1557   PROTEINUR NEGATIVE 07/16/2021 1557   NITRITE NEGATIVE 07/16/2021 1557   LEUKOCYTESUR NEGATIVE 07/16/2021 1557     Radiological Exams on Admission: CT Angio Chest PE W and/or Wo Contrast  Result Date: 04/25/2022 CLINICAL DATA:  Pulmonary embolism (PE) suspected, low to intermediate prob, positive D-dimer. Sarcoidosis EXAM: CT ANGIOGRAPHY CHEST WITH CONTRAST TECHNIQUE: Multidetector CT imaging of the chest was performed using the standard protocol during bolus administration of intravenous contrast. Multiplanar CT image reconstructions and MIPs were obtained to evaluate the vascular anatomy. RADIATION DOSE REDUCTION: This exam was performed according to the departmental dose-optimization program which includes automated exposure control, adjustment of the mA and/or kV according to patient size and/or use of iterative reconstruction technique. CONTRAST:  7m OMNIPAQUE IOHEXOL 350 MG/ML SOLN COMPARISON:  07/16/2021 FINDINGS: Cardiovascular: There is adequate opacification of the pulmonary arterial tree. There are multiple branching intraluminal filling defects within the lobar and segmental pulmonary arteries bilaterally in keeping with acute pulmonary embolus. The embolic burden is moderate. Central pulmonary arteries are enlarged in keeping with changes of pulmonary arterial hypertension. There is relative enlargement of the right ventricle and reversal of the normal RV/LV ratio (RV/LV equals 1.47) in keeping with changes of right heart strain. No significant coronary artery calcification. Global cardiac size within normal limits. No pericardial effusion. The thoracic aorta is unremarkable. Mediastinum/Nodes: Visualized thyroid is unremarkable. No pathologic thoracic adenopathy. Esophagus unremarkable. Lungs/Pleura: Evaluation of the pulmonary parenchyma is limited by respiratory motion artifact. Minimal scattered nodular infiltrates are seen within the upper lobes bilaterally which may be infectious or inflammatory in the acute setting. No pneumothorax or pleural effusion. Upper Abdomen: No acute abnormality.  Musculoskeletal: No chest wall abnormality. No acute or significant osseous findings. Review of the MIP images confirms the above findings. IMPRESSION: 1. Acute pulmonary embolus with moderate embolic burden and CT evidence of right heart strain (RV/LV equals 1.47). 2. Minimal scattered nodular infiltrates within the upper lobes bilaterally which may be infectious or inflammatory in the acute setting. These results were called by telephone at the time of interpretation on 04/25/2022 at 12:42 am to provider  Mesner, MD, who verbally acknowledged these results. Electronically Signed   By: Fidela Salisbury M.D.   On: 04/25/2022 00:42   DG Chest 2 View  Result Date: 04/24/2022 CLINICAL DATA:  Short of breath EXAM: CHEST - 2 VIEW COMPARISON:  None Available. FINDINGS: Normal mediastinum and cardiac silhouette. Low lung volumes. Central venous congestion. No focal consolidation. No pneumothorax. IMPRESSION: Low lung volumes and central venous congestion. Electronically Signed   By: Suzy Bouchard M.D.   On: 04/24/2022 17:59      Assessment/Plan   Principal Problem:   Acute pulmonary embolism (HCC) Active Problems:   Obstructive sleep apnea   Acute hypoxic respiratory failure (HCC)   Elevated troponin   Hypokalemia   Prolonged QT interval   GAD (generalized anxiety disorder)   Essential hypertension   Chronic pain syndrome   GERD (gastroesophageal reflux disease)      #) Acute bilateral pulmonary emboli: In the context of presenting, 3 to 4 days of progressive shortness of breath , CTA chest shows acute bilateral pulmonary emboli with moderate embolic burden as well as CT evidence of right heart strain.  Presentation also associated with acute hypoxic respiratory distress, as further detailed below. No evidence of associated hypotension to warrant consideration for TPA administration. Appears to be patient's first PE/DVT.  In terms of potential provoking factors, recent surgical procedure on high  is noted.  Otherwise, no overt recent/overt provoking factors.  Overall, will require at least 3-6 months of anticoagulation. Not on any blood-thinners at home, including no aspirin. heparin drip initiated in the ED.  Additionally, in the setting of moderate embolic burden along with evidence of right heart strain, EDP discussed patient's case with on-call PCCM, who will formally consult, with additional recommendations pending at this time.  mildly elevated troponin appears consistent with finding of acute PE, and ACS is felt to be less likely at this time.  Presentation not associated with any typical chest pain, while EKG demonstrates no evidence of acute ischemic changes. Will continue to trend troponin and follow-up result of echocardiogram.     Plan: Continue heparin drip. Monitor on telemetry. Monitor for development of hypotension. Prn supplemental O2 in order to maintain oxygen saturations greater than or equal to 92%. Prn albuterol nebulizers.  Continue outpatient Percocet, but in prn fashion. Incentive spirometry.  Continuous pulse ox. Check VBG.  Recheck CBC in the morning.  PCCM has been formally consulted, as above.  Echocardiogram.  Continue to trend troponin.  Given increased risk for preload dependence, have also started gentle IV fluids in the form of lactated Ringer's at 75 cc/h x 8 hours.             #) Acute hypoxic respiratory distress: in the context of acute respiratory symptoms and no known baseline supplemental O2 requirements, presenting O2 sat in the mid 80s, Sosan improving into the mid to high 90s on 2 L nasal cannula, thereby meeting criteria for acute hypoxic respiratory distress as opposed to acute hypoxic respiratory failure at this time. Appears to be on basis of presenting acute bilateral PE with moderate clot burden.  CTA chest shows aforementioned bilateral pulmonary emboli, also showing some minimal nodular bilateral infiltrates, favoring inflammatory  component relative to infectious possibility.  Will add on procalcitonin level to further assess.  Clinically, presentation appears less infectious, and SIRS criteria are not met for sepsis at this time.  Of note, COVID, influenza, and RSV PCR are all negative.  Suspect that mildly elevated troponin is  on the basis of supply demand mismatch from presenting acute bilateral pulmonary emboli as well as resulting from the decrease in oxygen delivery capacity that is a/w presenting acute hypoxic respiratory distress as opposed to representing a type I process due to acute plaque rupture, particularly given presenting ekg that shows no e/o acute ischemic changes, including no evidence of STEMI. Overall, ACS is felt to be less likely relative to type 2 supply demand mismatch, as above, but will closely monitor on telemetry overnight while trending troponin, following.  Echocardiogram, and treating underlying acute bilateral pulmonary emboli, including with continuation of heparin drip.   In terms of other considered etiologies, no clinical or radiographic evidence to suggest acutely decompensated heart failure at this time.    Plan: further evaluation/management of presenting acute bilateral pulmonary emboli, as above, including continuation of heparin drip. Monitor continuous pulse ox with prn supplemental O2 to maintain O2 sats greater than or equal to 92%. monitor on telemetry. CMP/CBC in the AM. Check serum Mg and Phos levels. Check blood gas. incentive spirometry.  Trend troponin.  Echocardiogram ordered for the morning.  Add on procalcitonin level.             #) Hypokalemia: Presenting serum potassium level found to be 3.1.  There appears to be nominative chronicity in this low serum potassium level, as the patient is on chronic potassium supplementation as an outpatient.  She is status post 40 mill colons of oral potassium chloride administered earlier during her ED course.  Plan: Will provide  an additional 40 mill colons of oral potassium chloride x 1 now followed by resumption of her home potassium chloride 20 equivalents p.o. daily.  Evidence for magnesium level.  Monitor on symmetry.  Repeat CMP in the morning.               #) QTc prolongation: Presenting EKG demonstrates QTc of 538 ms. outpatient medications that may be contributing to QTc prolongation: Zoloft.    Plan: Monitor on telemetry.  Add-on serum magnesium level.  Repeat EKG in the morning to monitor interval degree of QTc prolongation.  Hold outpatient Zoloft.               #) Generalized anxiety disorder: documented h/o such. On Zoloft as outpatient as well as scheduled clonazepam.  In the setting of QTc prolongation, will hold home Zoloft.  Plan: Hold home Zoloft for now, as above.  Hold home clonazepam for now, favor of prn p.o. Ativan.  Repeat EKG in the morning to further evaluate trend and QTc prolongation.              #) Essential Hypertension: documented h/o such, with outpatient antihypertensive regimen including HCTZ, lisinopril, verapamil..  SBP's in the ED today: Low 100s to 140s mmHg. in the context of presenting acute bilateral pulmonary emboli with moderate clot burden, and increased risk for development of hypotension, as well as increased risk for development of preload dependent pathophysiology, will hold home antihypertensive medications as well as home diuretic medications for now.  Plan: Close monitoring of subsequent BP via routine VS. hold home HCTZ, lisinopril, verapamil for now, as above.  Monitor strict I's and O's and daily weights.  Further evaluation management of presenting acute bilateral pulmonary emboli, as above.               #) Chronic pain syndrome: Documented history of such, on scheduled Percocet 3 times daily as an outpatient.  In order to reduce risk for  diminished respiratory drive, which would be particularly pertinent given  presenting acute bilateral pulmonary emboli complicated by acute hypoxic respiratory distress, will at least transiently change her scheduled outpatient Percocet to prn.  Plan: Change outpatient schedule Percocet to prn.  Monitor continuous pulse oximetry.  Prn Narcan.              #) GERD: documented h/o such; on Protonix as outpatient.   Plan: continue home PPI.       DVT prophylaxis: Continue heparin drip, as above. Code Status: Full code Family Communication: none Disposition Plan: Per Rounding Team Consults called: EDP discussed patient's case with on-call PCCM, who will formally consult, as further detailed above.;  Admission status: Observation     I SPENT GREATER THAN 75  MINUTES IN CLINICAL CARE TIME/MEDICAL DECISION-MAKING IN COMPLETING THIS ADMISSION.      Calvin DO [facility]  From [place]   04/25/2022, 1:44 AM

## 2022-04-25 NOTE — Assessment & Plan Note (Addendum)
-   Suspected due to acute PE -Patient was able to be weaned off of oxygen prior to discharge - Ambulated in hall with mobility specialist with no hypoxia - Patient does however have oxygen at home that she uses intermittently

## 2022-04-25 NOTE — Progress Notes (Addendum)
Julie Cruz is Dr Erin Fulling p ulm patniet - call from ER regarding PE patient    She reports being diagnosed with sarcoidosis at age 57 via lung biopsy in 2007 at [facility] in [place].  She was initially on prednisone for treatment but then due to side effects has stopped this therapy and has been weary of using methotrexate so she has not required systemic therapy over the last few years.  She denies progressive dyspnea since deciding not to have systemic treatment of her sarcoidosis. She is currently using albuterol twice daily for shortness of breath.  She was previously on Advair but did not like this inhaler.    She has history of obstructive sleep apnea and has a CPAP machine but has not used it in months as it is one of the machines included in the recent recall.  She has gained 20 to 40 pounds over the last couple of years has a history significant for sarcoidosis with cardiac involvement, hypertension, pulmonary hypertension, bipolar 1. She relocated from [place]. Her cardiologist was at [facility]. She had a PET scan in 2020 that showed no cardiac activity. There was apparently mild inflammation previously, but the PET scan 02/07/2019 showed no uptake. Left heart catheterization in 2019 was without obstructive disease. She had an ILR implanted for an episode of syncope. ILR reached RRT October 2022. 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    Uses BiPAP at night  Pulse o 80s -> 2L [place] 90s BNP 314 Trop 27 -> 46 RV/LV ratio 1.47 SBP 112 HR 90s  CT Angio Chest PE W and/or Wo Contrast  Result Date: 04/25/2022 CLINICAL DATA:  Pulmonary embolism (PE) suspected, low to intermediate prob, positive D-dimer. Sarcoidosis EXAM: CT ANGIOGRAPHY CHEST WITH CONTRAST TECHNIQUE: Multidetector CT imaging of the chest was performed using the standard protocol during bolus administration of intravenous contrast. Multiplanar CT image reconstructions and MIPs were  obtained to evaluate the vascular anatomy. RADIATION DOSE REDUCTION: This exam was performed according to the departmental dose-optimization program which includes automated exposure control, adjustment of the mA and/or kV according to patient size and/or use of iterative reconstruction technique. CONTRAST:  14m OMNIPAQUE IOHEXOL 350 MG/ML SOLN COMPARISON:  07/16/2021 FINDINGS: Cardiovascular: There is adequate opacification of the pulmonary arterial tree. There are multiple branching intraluminal filling defects within the lobar and segmental pulmonary arteries bilaterally in keeping with acute pulmonary embolus. The embolic burden is moderate. Central pulmonary arteries are enlarged in keeping with changes of pulmonary arterial hypertension. There is relative enlargement of the right ventricle and reversal of the normal RV/LV ratio (RV/LV equals 1.47) in keeping with changes of right heart strain. No significant coronary artery calcification. Global cardiac size within normal limits. No pericardial effusion. The thoracic aorta is unremarkable. Mediastinum/Nodes: Visualized thyroid is unremarkable. No pathologic thoracic adenopathy. Esophagus unremarkable. Lungs/Pleura: Evaluation of the pulmonary parenchyma is limited by respiratory motion artifact. Minimal scattered nodular infiltrates are seen within the upper lobes bilaterally which may be infectious or inflammatory in the acute setting. No pneumothorax or pleural effusion. Upper Abdomen: No acute abnormality. Musculoskeletal: No chest wall abnormality. No acute or significant osseous findings. Review of the MIP images confirms the above findings. IMPRESSION: 1. Acute pulmonary embolus with moderate embolic burden and CT evidence of right heart strain (RV/LV equals 1.47). 2. Minimal scattered nodular infiltrates within the upper lobes bilaterally which may be infectious or inflammatory in the acute setting. These results were called by telephone  at the time of  interpretation on 04/25/2022 at 12:42 am to provider Mesner, MD, who verbally acknowledged these results. Electronically Signed   By: Fidela Salisbury M.D.   On: 04/25/2022 00:42   DG Chest 2 View  Result Date: 04/24/2022 CLINICAL DATA:  Short of breath EXAM: CHEST - 2 VIEW COMPARISON:  None Available. FINDINGS: Normal mediastinum and cardiac silhouette. Low lung volumes. Central venous congestion. No focal consolidation. No pneumothorax. IMPRESSION: Low lung volumes and central venous congestion. Electronically Signed   By: Suzy Bouchard M.D.   On: 04/24/2022 21:71     57 year old female wit sarcoid - echo 2022    A PESI score class 2 low risk/ However, this is submassive PE given high RV/lV ration. Right now biomarkers high. Status of RV not known  Acute hypoxemic resp faiure - 2L [place]  Plan - start IV heparing - check lactate - check stat echo  - if biomarkers rising + echo with RV dysfn -> then risk is rising and needs EKOS v local thrombolysis  [email or web address]  Ccm will eval at bedside   SIGNATURE    Dr. Brand Males, M.D., F.C.C.P,  Pulmonary and Critical Care Medicine Staff Physician, [place] [facility] Director - Interstitial Lung Disease  Program  Medical Director - [facility] [place] [facility] [place], [place], [address]   Pager: [phone], If no answer  -[place] or [place] [phone] Telephone (clinical office): [phone] Telephone (research): [phone]  1:03 AM 04/25/2022

## 2022-04-25 NOTE — ED Notes (Addendum)
RN notified MD Girguis of patients concerns about her Difluprednate eye drop she has not received. MD told me to let the patient know that a 1-2 day delay on the medications is not urgent/emergent. RN will continue care plan.

## 2022-04-25 NOTE — Assessment & Plan Note (Signed)
-   downtrending; see PE

## 2022-04-25 NOTE — ED Notes (Signed)
Pt used bedside commode.  Pt is currently washing up and brushing teeth.

## 2022-04-25 NOTE — Progress Notes (Signed)
  Echocardiogram 2D Echocardiogram has been performed.  Julie Cruz 04/25/2022, 8:45 AM

## 2022-04-25 NOTE — ED Notes (Signed)
Patient ambulated to restroom with oxygen connected.

## 2022-04-25 NOTE — Assessment & Plan Note (Signed)
-   followed by [facility], Dr. Despina Hick - underwent: "RIGHT EYE PHACOEMULSIFICATION (PHACO) WITH INTRAOCULAR LENS IMPLANT (IOL) (Right) RIGHT EYE AHMED GLAUCOMA DRAINAGE DEVICE WITH CORNEA PATCH GRAFT (Right)" on 03/08/22 - continue eye drops

## 2022-04-25 NOTE — Assessment & Plan Note (Signed)
-   Continue Klonopin, melatonin, sertraline, trazodone

## 2022-04-25 NOTE — Progress Notes (Signed)
Progress Note    Julie Cruz   [id number]  DOB: 11/24/1965  DOA: 04/24/2022     0 PCP: Lin Landsman, MD  Initial CC: SOB  Hospital Course: Julie Cruz is a 57 yo female with PMH cardiac sarcoidosis, bipolar 1 disorder, depression/anxiety, HTN who presented with worsening dyspnea.  She has been less active since recent eye surgery on 03/08/2022.  Due to her progressively worsening dyspnea, she presented for further evaluation.  She was found to be hypoxic in the mid 80s and placed on 2 L oxygen on admission. CTA chest was performed which revealed bilateral PE of moderate severity.  Lower extremity duplex also positive for left lower extremity DVT. She was started on a heparin drip and admitted for further workup and pulmonology consultation.  Interval History:  Seen this morning in the ER.  Patient standing and walking around the room in no distress.  Slight improvement in dyspnea but still present.  Remains on oxygen.  Assessment and Plan: * Acute pulmonary embolism ([place]) - likely multifactorial etiology: decreased mobility, obesity. No prior hx VTE - per CTA: multiple branching intraluminal filling defects within the lobar and segmental pulmonary arteries bilaterally in keeping with acute pulmonary embolus - echo obtained: EF 65-70%, no RWMA, mild LVH. RV systolic function moderately reduced, RV size mildly enlarged - continue heparin drip - remains stable on 2L, no distress, no worsening - trop has downtrended (27 >> 46 >> 23) - BNP (314 >> 364) - discussed with pulmonology regarding results; will continue on heparin for now. Not pursuing lytics at this time given her stability   Acute DVT (deep venous thrombosis) (HCC) - LE duplex obtained: left posterior tibial vein - continue anticoagulation   Acute respiratory failure with hypoxia (HCC) - Suspected due to acute PE - Continue oxygen, stable on 2 L - Suspect will need home O2 at discharge as well  Sarcoidosis -  affects heart and eyes per patient - continue MTX  Uveitic glaucoma of right eye - followed by [facility], Dr. Despina Hick - underwent: "RIGHT EYE PHACOEMULSIFICATION (PHACO) WITH INTRAOCULAR LENS IMPLANT (IOL) (Right) RIGHT EYE AHMED GLAUCOMA DRAINAGE DEVICE WITH CORNEA PATCH GRAFT (Right)" on 03/08/22 - continue eye drops    GERD (gastroesophageal reflux disease) - Continue Protonix  Chronic pain syndrome - Continue as needed Percocet  Essential hypertension - BP currently controlled  GAD (generalized anxiety disorder) - Continue Klonopin, melatonin, sertraline, trazodone  Prolonged QT interval - will use caution with prolongation agents  Hypokalemia - replete as needed  Obstructive sleep apnea - Needs formal outpatient sleep study if not done already  Elevated troponin-resolved as of 04/25/2022 - downtrending; see PE   Old records reviewed in assessment of this patient  Antimicrobials:   DVT prophylaxis:   Heparin drip  Code Status:   Code Status: Full Code  Mobility Assessment (last 72 hours)     Mobility Assessment   No documentation.           Barriers to discharge:  Disposition Plan: Home 1 to 2 days Status is: Inpatient  Objective: Blood pressure (!) 101/51, pulse 84, temperature 98.5 F (36.9 C), temperature source Oral, resp. rate (!) 27, height '5\' 11"'$  (1.803 m), weight (!) 153.8 kg, SpO2 98 %.  Examination:  Physical Exam Constitutional:      General: She is not in acute distress.    Appearance: She is well-developed. She is obese. She is not ill-appearing.  HENT:     Head: Normocephalic  and atraumatic.     Mouth/Throat:     Mouth: Mucous membranes are moist.  Eyes:     Extraocular Movements: Extraocular movements intact.  Cardiovascular:     Rate and Rhythm: Normal rate and regular rhythm.  Pulmonary:     Effort: Pulmonary effort is normal.     Breath sounds: Normal breath sounds. No wheezing or rhonchi.  Abdominal:      General: Bowel sounds are normal. There is no distension.     Palpations: Abdomen is soft.     Tenderness: There is no abdominal tenderness.  Musculoskeletal:        General: Normal range of motion.     Cervical back: Normal range of motion and neck supple.  Skin:    General: Skin is warm and dry.  Neurological:     General: No focal deficit present.     Mental Status: She is alert.  Psychiatric:        Mood and Affect: Mood normal.      Consultants:  Pulmonology  Procedures:    Data Reviewed: Results for orders placed or performed during the hospital encounter of 04/24/22 (from the past 24 hour(s))  Resp panel by RT-PCR (RSV, Flu A&B, Covid) Anterior Nasal Swab     Status: None   Collection Time: 04/24/22  6:51 PM   Specimen: Anterior Nasal Swab  Result Value Ref Range   SARS Coronavirus 2 by RT PCR NEGATIVE NEGATIVE   Influenza A by PCR NEGATIVE NEGATIVE   Influenza B by PCR NEGATIVE NEGATIVE   Resp Syncytial Virus by PCR NEGATIVE NEGATIVE  Troponin I (High Sensitivity)     Status: Abnormal   Collection Time: 04/24/22  8:45 PM  Result Value Ref Range   Troponin I (High Sensitivity) 46 (H) <18 ng/L  D-dimer, quantitative     Status: Abnormal   Collection Time: 04/24/22  8:45 PM  Result Value Ref Range   D-Dimer, Quant 10.00 (H) 0.00 - 0.50 ug/mL-FEU  CBC with Differential/Platelet     Status: Abnormal   Collection Time: 04/25/22  2:50 AM  Result Value Ref Range   WBC 5.7 4.0 - 10.5 K/uL   RBC 3.57 (L) 3.87 - 5.11 MIL/uL   Hemoglobin 11.9 (L) 12.0 - 15.0 g/dL   HCT 35.8 (L) 36.0 - 46.0 %   MCV 100.3 (H) 80.0 - 100.0 fL   MCH 33.3 26.0 - 34.0 pg   MCHC 33.2 30.0 - 36.0 g/dL   RDW 15.8 (H) 11.5 - 15.5 %   Platelets 142 (L) 150 - 400 K/uL   nRBC 1.2 (H) 0.0 - 0.2 %   Neutrophils Relative % 47 %   Neutro Abs 2.7 1.7 - 7.7 K/uL   Lymphocytes Relative 39 %   Lymphs Abs 2.2 0.7 - 4.0 K/uL   Monocytes Relative 9 %   Monocytes Absolute 0.5 0.1 - 1.0 K/uL    Eosinophils Relative 4 %   Eosinophils Absolute 0.3 0.0 - 0.5 K/uL   Basophils Relative 1 %   Basophils Absolute 0.0 0.0 - 0.1 K/uL   Immature Granulocytes 0 %   Abs Immature Granulocytes 0.02 0.00 - 0.07 K/uL  Comprehensive metabolic panel     Status: Abnormal   Collection Time: 04/25/22  2:50 AM  Result Value Ref Range   Sodium 141 135 - 145 mmol/L   Potassium 3.2 (L) 3.5 - 5.1 mmol/L   Chloride 105 98 - 111 mmol/L   CO2 26 22 - 32 mmol/L  Glucose, Bld 130 (H) 70 - 99 mg/dL   BUN 9 6 - 20 mg/dL   Creatinine, Ser 1.13 (H) 0.44 - 1.00 mg/dL   Calcium 9.1 8.9 - 10.3 mg/dL   Total Protein 6.7 6.5 - 8.1 g/dL   Albumin 3.9 3.5 - 5.0 g/dL   AST 43 (H) 15 - 41 U/L   ALT 50 (H) 0 - 44 U/L   Alkaline Phosphatase 54 38 - 126 U/L   Total Bilirubin 0.6 0.3 - 1.2 mg/dL   GFR, Estimated 57 (L) >60 mL/min   Anion gap 10 5 - 15  Magnesium     Status: None   Collection Time: 04/25/22  2:50 AM  Result Value Ref Range   Magnesium 2.0 1.7 - 2.4 mg/dL  Phosphorus     Status: None   Collection Time: 04/25/22  2:50 AM  Result Value Ref Range   Phosphorus 3.8 2.5 - 4.6 mg/dL  Brain natriuretic peptide     Status: Abnormal   Collection Time: 04/25/22  2:50 AM  Result Value Ref Range   B Natriuretic Peptide 364.8 (H) 0.0 - 100.0 pg/mL  Procalcitonin     Status: None   Collection Time: 04/25/22  2:50 AM  Result Value Ref Range   Procalcitonin <0.10 ng/mL  Lactic acid, plasma     Status: None   Collection Time: 04/25/22  4:30 AM  Result Value Ref Range   Lactic Acid, Venous 1.6 0.5 - 1.9 mmol/L  Troponin I (High Sensitivity)     Status: Abnormal   Collection Time: 04/25/22  4:30 AM  Result Value Ref Range   Troponin I (High Sensitivity) 23 (H) <18 ng/L  I-Stat venous blood gas, ED     Status: Abnormal   Collection Time: 04/25/22  5:24 AM  Result Value Ref Range   pH, Ven 7.483 (H) 7.25 - 7.43   pCO2, Ven 34.7 (L) 44 - 60 mmHg   pO2, Ven 180 (H) 32 - 45 mmHg   Bicarbonate 26.0 20.0 - 28.0  mmol/L   TCO2 27 22 - 32 mmol/L   O2 Saturation 100 %   Acid-Base Excess 3.0 (H) 0.0 - 2.0 mmol/L   Sodium 142 135 - 145 mmol/L   Potassium 3.5 3.5 - 5.1 mmol/L   Calcium, Ion 1.12 (L) 1.15 - 1.40 mmol/L   HCT 37.0 36.0 - 46.0 %   Hemoglobin 12.6 12.0 - 15.0 g/dL   Sample type VENOUS   Heparin level (unfractionated)     Status: None   Collection Time: 04/25/22 11:26 AM  Result Value Ref Range   Heparin Unfractionated 0.52 0.30 - 0.70 IU/mL    I have reviewed pertinent nursing notes, vitals, labs, and images as necessary. I have ordered labwork to follow up on as indicated.  I have reviewed the last notes from staff over past 24 hours. I have discussed patient's care plan and test results with nursing staff, CM/SW, and other staff as appropriate.  Time spent: Greater than 50% of the 55 minute visit was spent in counseling/coordination of care for the patient as laid out in the A&P.   LOS: 0 days   Dwyane Dee, MD [facility] 04/25/2022, 6:03 PM

## 2022-04-25 NOTE — Assessment & Plan Note (Addendum)
-   affects heart and eyes per patient - continue MTX - might need long term AC given DVT/PE? Patient to followup outpatient

## 2022-04-25 NOTE — Hospital Course (Signed)
Julie Cruz is a 57 yo female with PMH cardiac sarcoidosis, bipolar 1 disorder, depression/anxiety, HTN who presented with worsening dyspnea.  She has been less active since recent eye surgery on 03/08/2022.  Due to her progressively worsening dyspnea, she presented for further evaluation.  She was found to be hypoxic in the mid 80s and placed on 2 L oxygen on admission. CTA chest was performed which revealed bilateral PE of moderate severity.  Lower extremity duplex also positive for left lower extremity DVT. She was started on a heparin drip and admitted for further workup and pulmonology consultation. She remained clinically stable after admission.  Troponin down trended and BNP remained relatively stable. Echo was obtained which showed EF 65 to 70%, no RWMA.  Mild LVH.  Right ventricular systolic function was moderately reduced with mildly enlarged RV size. Findings were discussed with pulmonology and she was not felt to warrant lytic treatment given ongoing clinical stability. Lower extremity duplex was also positive for left posterior tibial vein DVT. She was also able to be weaned off of oxygen to room air prior to discharge and ambulated in the hallway with no hypoxia. She was transitioned off of heparin to Eliquis at discharge.

## 2022-04-25 NOTE — Assessment & Plan Note (Signed)
-   BP currently controlled

## 2022-04-26 ENCOUNTER — Other Ambulatory Visit (HOSPITAL_COMMUNITY): Payer: Self-pay

## 2022-04-26 DIAGNOSIS — D869 Sarcoidosis, unspecified: Secondary | ICD-10-CM | POA: Diagnosis not present

## 2022-04-26 DIAGNOSIS — J9601 Acute respiratory failure with hypoxia: Secondary | ICD-10-CM | POA: Diagnosis not present

## 2022-04-26 DIAGNOSIS — I2699 Other pulmonary embolism without acute cor pulmonale: Secondary | ICD-10-CM | POA: Diagnosis not present

## 2022-04-26 DIAGNOSIS — I82442 Acute embolism and thrombosis of left tibial vein: Secondary | ICD-10-CM | POA: Diagnosis not present

## 2022-04-26 LAB — CBC WITH DIFFERENTIAL/PLATELET
Abs Immature Granulocytes: 0.01 10*3/uL (ref 0.00–0.07)
Basophils Absolute: 0 10*3/uL (ref 0.0–0.1)
Basophils Relative: 0 %
Eosinophils Absolute: 0.3 10*3/uL (ref 0.0–0.5)
Eosinophils Relative: 7 %
HCT: 34.4 % — ABNORMAL LOW (ref 36.0–46.0)
Hemoglobin: 11.4 g/dL — ABNORMAL LOW (ref 12.0–15.0)
Immature Granulocytes: 0 %
Lymphocytes Relative: 43 %
Lymphs Abs: 2 10*3/uL (ref 0.7–4.0)
MCH: 33.8 pg (ref 26.0–34.0)
MCHC: 33.1 g/dL (ref 30.0–36.0)
MCV: 102.1 fL — ABNORMAL HIGH (ref 80.0–100.0)
Monocytes Absolute: 0.5 10*3/uL (ref 0.1–1.0)
Monocytes Relative: 11 %
Neutro Abs: 1.8 10*3/uL (ref 1.7–7.7)
Neutrophils Relative %: 39 %
Platelets: 133 10*3/uL — ABNORMAL LOW (ref 150–400)
RBC: 3.37 MIL/uL — ABNORMAL LOW (ref 3.87–5.11)
RDW: 16.1 % — ABNORMAL HIGH (ref 11.5–15.5)
WBC: 4.7 10*3/uL (ref 4.0–10.5)
nRBC: 0.6 % — ABNORMAL HIGH (ref 0.0–0.2)

## 2022-04-26 LAB — RESP PANEL BY RT-PCR (RSV, FLU A&B, COVID)  RVPGX2
Influenza A by PCR: NEGATIVE
Influenza B by PCR: NEGATIVE
Resp Syncytial Virus by PCR: NEGATIVE
SARS Coronavirus 2 by RT PCR: NEGATIVE

## 2022-04-26 LAB — BASIC METABOLIC PANEL
Anion gap: 7 (ref 5–15)
BUN: 12 mg/dL (ref 6–20)
CO2: 26 mmol/L (ref 22–32)
Calcium: 9.1 mg/dL (ref 8.9–10.3)
Chloride: 108 mmol/L (ref 98–111)
Creatinine, Ser: 1.05 mg/dL — ABNORMAL HIGH (ref 0.44–1.00)
GFR, Estimated: >60 mL/min (ref >60)
Glucose, Bld: 112 mg/dL — ABNORMAL HIGH (ref 70–99)
Potassium: 3.5 mmol/L (ref 3.5–5.1)
Sodium: 141 mmol/L (ref 135–145)

## 2022-04-26 LAB — HEPARIN LEVEL (UNFRACTIONATED): Heparin Unfractionated: 0.48 IU/mL (ref 0.30–0.70)

## 2022-04-26 LAB — MAGNESIUM: Magnesium: 1.9 mg/dL (ref 1.7–2.4)

## 2022-04-26 MED ORDER — APIXABAN (ELIQUIS) VTE STARTER PACK (10MG AND 5MG)
ORAL_TABLET | ORAL | 0 refills | Status: DC
  Filled 2022-04-26: qty 74, 30d supply, fill #0

## 2022-04-26 MED ORDER — APIXABAN 5 MG PO TABS: 5.0000 mg | ORAL_TABLET | Freq: Two times a day (BID) | ORAL | Status: DC

## 2022-04-26 MED ORDER — DORZOLAMIDE HCL 2 % OP SOLN
1.0000 [drp] | Freq: Three times a day (TID) | OPHTHALMIC | Status: DC
  Administered 2022-04-26 (×2): 1 [drp] via OPHTHALMIC

## 2022-04-26 MED ORDER — APIXABAN 5 MG PO TABS
10.0000 mg | ORAL_TABLET | Freq: Two times a day (BID) | ORAL | Status: DC
  Administered 2022-04-26: 10 mg via ORAL
  Filled 2022-04-26: qty 2

## 2022-04-26 MED ORDER — HEPARIN (PORCINE) 25000 UT/250ML-% IV SOLN: 1500.0000 [IU]/h | INTRAVENOUS | Status: AC

## 2022-04-26 NOTE — Progress Notes (Signed)
Mobility Specialist Progress Note   04/26/22 1150  Mobility  Activity Ambulated with assistance in hallway;Dangled on edge of bed  Level of Assistance Modified independent, requires aide device or extra time  Assistive Device None  Distance Ambulated (ft) 280 ft  Range of Motion/Exercises Active;All extremities  Activity Response Tolerated well   Pre Ambulation:  HR 88, SpO2 99% RA During Ambulation: HR 117 +/-, SpO2 >96% RA Post Ambulation: HR 93, SpO2 97% RA  Patient received side-lying in bed and agreeable to participate. Is independent for bed mobility, standing and ambulation but required extra time secondary dyspnea. Ambulated in hallway with slow steady gait. Oximeter with inconsistent readings during ambulation due to poor plethora, with good plethora oxygen saturation maintained >96% on RA. Was 2/5 dyspneic with exertion, able to maintain conversation during ambulation. Returned to room without incident. Completed education on RPE and energy conservation techniques. Was left dangling EOB with all needs met and MD present.  Julie Cruz, BS EXP Mobility Specialist Please contact via SecureChat or Rehab office at [phone]

## 2022-04-26 NOTE — Discharge Summary (Signed)
Physician Discharge Summary   KAYELEIGH DIPIETRANTONIO [id number] DOB: [birth year] DOA: 04/24/2022  PCP: Lin Landsman, MD  Admit date: 04/24/2022 Discharge date: 04/26/2022  Admitted From: Home Disposition:  Home Discharging physician: Dwyane Dee, MD Barriers to discharge:   Recommendations for Outpatient Follow-up:  Discuss possible need for long term anticoagulation in setting of sarcoidosis May need repeat echo in ~6 months. Patient to follow up with pulmonology  May need sleep study if not previously done   Discharge Condition: stable CODE STATUS: Full Diet recommendation:  Diet Orders (From admission, onward)     Start     Ordered   04/26/22 1013  Diet regular Room service appropriate? Yes with Assist; Fluid consistency: Thin  Diet effective now       Question Answer Comment  Room service appropriate? Yes with Assist   Fluid consistency: Thin      04/26/22 1012   04/26/22 0000  Diet general        04/26/22 1226            Hospital Course: Ms. Latimore is a 57 yo female with PMH cardiac sarcoidosis, bipolar 1 disorder, depression/anxiety, HTN who presented with worsening dyspnea.  She has been less active since recent eye surgery on 03/08/2022.  Due to her progressively worsening dyspnea, she presented for further evaluation.  She was found to be hypoxic in the mid 80s and placed on 2 L oxygen on admission. CTA chest was performed which revealed bilateral PE of moderate severity.  Lower extremity duplex also positive for left lower extremity DVT. She was started on a heparin drip and admitted for further workup and pulmonology consultation. She remained clinically stable after admission.  Troponin down trended and BNP remained relatively stable. Echo was obtained which showed EF 65 to 70%, no RWMA.  Mild LVH.  Right ventricular systolic function was moderately reduced with mildly enlarged RV size. Findings were discussed with pulmonology and she was not felt to warrant  lytic treatment given ongoing clinical stability. Lower extremity duplex was also positive for left posterior tibial vein DVT. She was also able to be weaned off of oxygen to room air prior to discharge and ambulated in the hallway with no hypoxia. She was transitioned off of heparin to Eliquis at discharge.  Assessment and Plan: * Acute pulmonary embolism ([place]) - likely multifactorial etiology but suspect her sarcoid increases her probability, but patient also has hx decreased mobility and obesity (no prior hx of VTE) - per CTA: multiple branching intraluminal filling defects within the lobar and segmental pulmonary arteries bilaterally in keeping with acute pulmonary embolus - echo obtained: EF 65-70%, no RWMA, mild LVH. RV systolic function moderately reduced, RV size mildly enlarged -Transitioned from heparin drip to Eliquis at discharge - remains stable on 2L, no distress, no worsening - trop has downtrended (27 >> 46 >> 23) - BNP (314 >> 364) - discussed with pulmonology regarding results; no need for pursuing lytic treatment -Outpatient follow-up with pulmonology, patient follows with Dr. Erin Fulling  Acute DVT (deep venous thrombosis) ([place]) - LE duplex obtained: left posterior tibial vein - continue anticoagulation   Acute respiratory failure with hypoxia ([facility])-resolved as of 04/26/2022 - Suspected due to acute PE -Patient was able to be weaned off of oxygen prior to discharge - Ambulated in hall with mobility specialist with no hypoxia - Patient does however have oxygen at home that she uses intermittently  Sarcoidosis - affects heart and eyes per patient - continue MTX - might  need long term AC given DVT/PE? Patient to followup outpatient   Uveitic glaucoma of right eye - followed by [facility] ophtho, Dr. Despina Hick - underwent: "RIGHT EYE PHACOEMULSIFICATION (PHACO) WITH INTRAOCULAR LENS IMPLANT (IOL) (Right) RIGHT EYE AHMED GLAUCOMA DRAINAGE DEVICE WITH CORNEA PATCH GRAFT  (Right)" on 03/08/22 - continue eye drops    GERD (gastroesophageal reflux disease) - Continue Protonix  Chronic pain syndrome - Continue as needed Percocet  Essential hypertension - BP currently controlled  GAD (generalized anxiety disorder) - Continue Klonopin, melatonin, sertraline, trazodone  Prolonged QT interval - will use caution with prolongation agents  Hypokalemia - repleted  Obstructive sleep apnea - Needs formal outpatient sleep study if not done already  Elevated troponin-resolved as of 04/25/2022 - downtrending; see PE       The patient's chronic medical conditions were treated accordingly per the patient's home medication regimen except as noted.  On day of discharge, patient was felt deemed stable for discharge. Patient/family member advised to call PCP or come back to ER if needed.   Principal Diagnosis: Acute pulmonary embolism [facility])  Discharge Diagnoses: Active Hospital Problems   Diagnosis Date Noted   Acute pulmonary embolism ([place]) 04/25/2022    Priority: 1.   Acute DVT (deep venous thrombosis) ([place]) 04/25/2022    Priority: 2.   Sarcoidosis 09/15/2020    Priority: 4.   Uveitic glaucoma of right eye 04/25/2022    Priority: 5.   Hypokalemia 04/25/2022   Prolonged QT interval 04/25/2022   GAD (generalized anxiety disorder) 04/25/2022   Essential hypertension 04/25/2022   Chronic pain syndrome 04/25/2022   GERD (gastroesophageal reflux disease) 04/25/2022   Obstructive sleep apnea 03/15/2021    Resolved Hospital Problems   Diagnosis Date Noted Date Resolved   Acute respiratory failure with hypoxia [facility]) 04/25/2022 04/26/2022    Priority: 2.   Elevated troponin 04/25/2022 04/25/2022     Discharge Instructions     Diet general   Complete by: As directed    Increase activity slowly   Complete by: As directed       Allergies as of 04/26/2022   No Known Allergies      Medication List     STOP taking these medications     acetaZOLAMIDE 250 MG tablet Commonly known as: DIAMOX   aspirin 325 MG tablet   brimonidine 0.1 % Soln Commonly known as: ALPHAGAN P   Icy Hot 5 % Ptch Generic drug: Menthol (Topical Analgesic)   ICY HOT EX   methocarbamol 750 MG tablet Commonly known as: ROBAXIN   prednisoLONE acetate 1 % ophthalmic suspension Commonly known as: PRED FORTE   Rhopressa 0.02 % Soln Generic drug: Netarsudil Dimesylate       TAKE these medications    acetaminophen 650 MG CR tablet Commonly known as: TYLENOL Take 650 mg by mouth every 8 (eight) hours as needed for pain.   albuterol 108 (90 Base) MCG/ACT inhaler Commonly known as: VENTOLIN HFA Inhale 2 puffs into the lungs every 6 (six) hours as needed for wheezing or shortness of breath.   clonazePAM 0.5 MG tablet Commonly known as: KLONOPIN Take 0.5 mg by mouth daily.   cyclobenzaprine 10 MG tablet Commonly known as: FLEXERIL Take 10-20 mg by mouth daily as needed for muscle spasms.   Difluprednate 0.05 % Emul Apply 1 drop to eye 4 (four) times daily. Into the right eye.   dorzolamide 2 % ophthalmic solution Commonly known as: TRUSOPT Place 1 drop into the right eye 3 (  three) times daily.   Eliquis DVT/PE Starter Pack Generic drug: Apixaban Starter Pack ('10mg'$  and '5mg'$ ) Take as directed on package: start with two-'5mg'$  tablets twice daily for 7 days. On day 8, switch to one-'5mg'$  tablet twice daily.   folic acid 1 MG tablet Commonly known as: FOLVITE Take 1 tablet by mouth daily.   hydrochlorothiazide 50 MG tablet Commonly known as: HYDRODIURIL Take 50 mg by mouth in the morning.   ipratropium-albuterol 0.5-2.5 (3) MG/3ML Soln Commonly known as: DUONEB Take 3 mLs by nebulization every 6 (six) hours as needed.   lisinopril 40 MG tablet Commonly known as: ZESTRIL Take 40 mg by mouth in the morning.   Lumigan 0.01 % Soln Generic drug: bimatoprost Place 1 drop into both eyes at bedtime.   methotrexate 2.5 MG  tablet Commonly known as: RHEUMATREX Take 25 mg by mouth once a week. Caution:Chemotherapy. Protect from light. 10 tablets What changed: Another medication with the same name was removed. Continue taking this medication, and follow the directions you see here.   naproxen 500 MG tablet Commonly known as: NAPROSYN Take 1,000 mg by mouth daily as needed (pain.).   oxyCODONE-acetaminophen 10-325 MG tablet Commonly known as: PERCOCET Take 1 tablet by mouth 3 (three) times daily.   pantoprazole 40 MG tablet Commonly known as: PROTONIX Take 40 mg by mouth in the morning.   potassium chloride 10 MEQ tablet Commonly known as: KLOR-CON Take 20 mEq by mouth in the morning.   sertraline 100 MG tablet Commonly known as: ZOLOFT Take 200 mg by mouth in the morning.   traZODone 50 MG tablet Commonly known as: DESYREL Take 50 mg by mouth at bedtime.   verapamil 360 MG 24 hr capsule Commonly known as: VERELAN PM Take 360 mg by mouth in the morning.        No Known Allergies  Consultations: Pulmonology  Procedures:   Discharge Exam: BP (!) 132/95 (BP Location: Right Arm)   Pulse 75   Temp 98.1 F (36.7 C) (Oral)   Resp 13   Ht '5\' 11"'$  (1.803 m)   Wt (!) 154.2 kg   SpO2 99%   BMI 47.41 kg/m  Physical Exam Constitutional:      General: She is not in acute distress.    Appearance: She is well-developed. She is obese. She is not ill-appearing.  HENT:     Head: Normocephalic and atraumatic.     Mouth/Throat:     Mouth: Mucous membranes are moist.  Eyes:     Extraocular Movements: Extraocular movements intact.  Cardiovascular:     Rate and Rhythm: Normal rate and regular rhythm.  Pulmonary:     Effort: Pulmonary effort is normal.     Breath sounds: Normal breath sounds. No wheezing or rhonchi.  Abdominal:     General: Bowel sounds are normal. There is no distension.     Palpations: Abdomen is soft.     Tenderness: There is no abdominal tenderness.  Musculoskeletal:         General: Normal range of motion.     Cervical back: Normal range of motion and neck supple.  Skin:    General: Skin is warm and dry.  Neurological:     General: No focal deficit present.     Mental Status: She is alert.  Psychiatric:        Mood and Affect: Mood normal.      The results of significant diagnostics from this hospitalization (including imaging, microbiology, ancillary and  laboratory) are listed below for reference.   Microbiology: Recent Results (from the past 240 hour(s))  Resp panel by RT-PCR (RSV, Flu A&B, Covid) Anterior Nasal Swab     Status: None   Collection Time: 04/24/22  6:51 PM   Specimen: Anterior Nasal Swab  Result Value Ref Range Status   SARS Coronavirus 2 by RT PCR NEGATIVE NEGATIVE Final   Influenza A by PCR NEGATIVE NEGATIVE Final   Influenza B by PCR NEGATIVE NEGATIVE Final    Comment: (NOTE) The Xpert Xpress SARS-CoV-2/FLU/RSV plus assay is intended as an aid in the diagnosis of influenza from Nasopharyngeal swab specimens and should not be used as a sole basis for treatment. Nasal washings and aspirates are unacceptable for Xpert Xpress SARS-CoV-2/FLU/RSV testing.  Fact Sheet for Patients: EntrepreneurPulse.com.au  Fact Sheet for Healthcare Providers: IncredibleEmployment.be  This test is not yet approved or cleared by the [place] FDA and has been authorized for detection and/or diagnosis of SARS-CoV-2 by FDA under an Emergency Use Authorization (EUA). This EUA will remain in effect (meaning this test can be used) for the duration of the COVID-19 declaration under Section 564(b)(1) of the Act, 21 U.S.C. section 360bbb-3(b)(1), unless the authorization is terminated or revoked.     Resp Syncytial Virus by PCR NEGATIVE NEGATIVE Final    Comment: (NOTE) Fact Sheet for Patients: EntrepreneurPulse.com.au  Fact Sheet for Healthcare  Providers: IncredibleEmployment.be  This test is not yet approved or cleared by the [place] FDA and has been authorized for detection and/or diagnosis of SARS-CoV-2 by FDA under an Emergency Use Authorization (EUA). This EUA will remain in effect (meaning this test can be used) for the duration of the COVID-19 declaration under Section 564(b)(1) of the Act, 21 U.S.C. section 360bbb-3(b)(1), unless the authorization is terminated or revoked.  Performed at [facility], [place] [address], [place], [place] [address]      Labs: BNP (last 3 results) Recent Labs    04/24/22 1659 04/25/22 0250  BNP 314.2* 123456*   Basic Metabolic Panel: Recent Labs  Lab 04/24/22 1659 04/25/22 0250 04/25/22 0524 04/26/22 0116  NA 141 141 142 141  K 3.1* 3.2* 3.5 3.5  CL 101 105  --  108  CO2 26 26  --  26  GLUCOSE 116* 130*  --  112*  BUN 7 9  --  12  CREATININE 1.05* 1.13*  --  1.05*  CALCIUM 9.5 9.1  --  9.1  MG  --  2.0  --  1.9  PHOS  --  3.8  --   --    Liver Function Tests: Recent Labs  Lab 04/25/22 0250  AST 43*  ALT 50*  ALKPHOS 54  BILITOT 0.6  PROT 6.7  ALBUMIN 3.9   No results for input(s): "LIPASE", "AMYLASE" in the last 168 hours. No results for input(s): "AMMONIA" in the last 168 hours. CBC: Recent Labs  Lab 04/24/22 1659 04/25/22 0250 04/25/22 0524 04/26/22 0116  WBC 5.1 5.7  --  4.7  NEUTROABS 2.4 2.7  --  1.8  HGB 12.5 11.9* 12.6 11.4*  HCT 37.4 35.8* 37.0 34.4*  MCV 100.5* 100.3*  --  102.1*  PLT 147* 142*  --  133*   Cardiac Enzymes: No results for input(s): "CKTOTAL", "CKMB", "CKMBINDEX", "TROPONINI" in the last 168 hours. BNP: Invalid input(s): "POCBNP" CBG: Recent Labs  Lab 04/24/22 1758  GLUCAP 101*   D-Dimer Recent Labs    04/24/22 2045  DDIMER 10.00*  Hgb A1c No results for input(s): "HGBA1C" in the last 72 hours. Lipid Profile No results for input(s): "CHOL", "HDL", "LDLCALC", "TRIG", "CHOLHDL",  "LDLDIRECT" in the last 72 hours. Thyroid function studies No results for input(s): "TSH", "T4TOTAL", "T3FREE", "THYROIDAB" in the last 72 hours.  Invalid input(s): "FREET3" Anemia work up No results for input(s): "VITAMINB12", "FOLATE", "FERRITIN", "TIBC", "IRON", "RETICCTPCT" in the last 72 hours. Urinalysis    Component Value Date/Time   COLORURINE YELLOW 07/16/2021 1557   APPEARANCEUR HAZY (A) 07/16/2021 1557   LABSPEC 1.015 07/16/2021 1557   PHURINE 7.0 07/16/2021 1557   GLUCOSEU NEGATIVE 07/16/2021 1557   HGBUR NEGATIVE 07/16/2021 1557   BILIRUBINUR NEGATIVE 07/16/2021 1557   KETONESUR NEGATIVE 07/16/2021 1557   PROTEINUR NEGATIVE 07/16/2021 1557   NITRITE NEGATIVE 07/16/2021 1557   LEUKOCYTESUR NEGATIVE 07/16/2021 1557   Sepsis Labs Recent Labs  Lab 04/24/22 1659 04/25/22 0250 04/26/22 0116  WBC 5.1 5.7 4.7   Microbiology Recent Results (from the past 240 hour(s))  Resp panel by RT-PCR (RSV, Flu A&B, Covid) Anterior Nasal Swab     Status: None   Collection Time: 04/24/22  6:51 PM   Specimen: Anterior Nasal Swab  Result Value Ref Range Status   SARS Coronavirus 2 by RT PCR NEGATIVE NEGATIVE Final   Influenza A by PCR NEGATIVE NEGATIVE Final   Influenza B by PCR NEGATIVE NEGATIVE Final    Comment: (NOTE) The Xpert Xpress SARS-CoV-2/FLU/RSV plus assay is intended as an aid in the diagnosis of influenza from Nasopharyngeal swab specimens and should not be used as a sole basis for treatment. Nasal washings and aspirates are unacceptable for Xpert Xpress SARS-CoV-2/FLU/RSV testing.  Fact Sheet for Patients: EntrepreneurPulse.com.au  Fact Sheet for Healthcare Providers: IncredibleEmployment.be  This test is not yet approved or cleared by the [place] FDA and has been authorized for detection and/or diagnosis of SARS-CoV-2 by FDA under an Emergency Use Authorization (EUA). This EUA will remain in effect (meaning this test  can be used) for the duration of the COVID-19 declaration under Section 564(b)(1) of the Act, 21 U.S.C. section 360bbb-3(b)(1), unless the authorization is terminated or revoked.     Resp Syncytial Virus by PCR NEGATIVE NEGATIVE Final    Comment: (NOTE) Fact Sheet for Patients: EntrepreneurPulse.com.au  Fact Sheet for Healthcare Providers: IncredibleEmployment.be  This test is not yet approved or cleared by the [place] FDA and has been authorized for detection and/or diagnosis of SARS-CoV-2 by FDA under an Emergency Use Authorization (EUA). This EUA will remain in effect (meaning this test can be used) for the duration of the COVID-19 declaration under Section 564(b)(1) of the Act, 21 U.S.C. section 360bbb-3(b)(1), unless the authorization is terminated or revoked.  Performed at [facility], [place] [address] [place], [place] [address]     Procedures/Studies: VAS [name] LOWER EXTREMITY VENOUS (DVT)  Result Date: 04/25/2022  Lower Venous DVT Study Patient Name:  ROSELIA FORSBERG  Date of Exam:   04/25/2022 Medical Rec #: [id number]           Accession #:    [id number] Date of Birth: 1965-05-12           Patient Gender: F Patient Age:   57 years Exam Location:  [facility] Procedure:      VAS [name] LOWER EXTREMITY VENOUS (DVT) Referring Phys: Eddie Dibbles HOFFMAN --------------------------------------------------------------------------------  Indications: Pulmonary embolism, SOB, and Elevated D-Dimer.  Risk Factors: Surgery January 2024. Anticoagulation: Heparin. Limitations: Body habitus. Comparison Study: No prior  study. Performing Technologist: McKayla Maag RVT, [place]  Examination Guidelines: A complete evaluation includes B-mode imaging, spectral Doppler, color Doppler, and power Doppler as needed of all accessible portions of each vessel. Bilateral testing is considered an integral part of a complete examination. Limited examinations for  reoccurring indications may be performed as noted. The reflux portion of the exam is performed with the patient in reverse Trendelenburg.  +---------+---------------+---------+-----------+----------+--------------+ RIGHT    CompressibilityPhasicitySpontaneityPropertiesThrombus Aging +---------+---------------+---------+-----------+----------+--------------+ CFV      Full           Yes      Yes                                 +---------+---------------+---------+-----------+----------+--------------+ SFJ      Full                                                        +---------+---------------+---------+-----------+----------+--------------+ FV Prox  Full                                                        +---------+---------------+---------+-----------+----------+--------------+ FV Mid   Full                                                        +---------+---------------+---------+-----------+----------+--------------+ FV DistalFull                                                        +---------+---------------+---------+-----------+----------+--------------+ PFV      Full                                                        +---------+---------------+---------+-----------+----------+--------------+ POP      Full           Yes      Yes                                 +---------+---------------+---------+-----------+----------+--------------+ PTV      Full                                                        +---------+---------------+---------+-----------+----------+--------------+ PERO     Full                                                        +---------+---------------+---------+-----------+----------+--------------+   +---------+---------------+---------+-----------+----------+-------------------+  LEFT     CompressibilityPhasicitySpontaneityPropertiesThrombus Aging       +---------+---------------+---------+-----------+----------+-------------------+ CFV      Full           Yes      Yes                                      +---------+---------------+---------+-----------+----------+-------------------+ SFJ      Full                                                             +---------+---------------+---------+-----------+----------+-------------------+ FV Prox  Full                                                             +---------+---------------+---------+-----------+----------+-------------------+ FV Mid   Full                                                             +---------+---------------+---------+-----------+----------+-------------------+ FV Distal               Yes      Yes                                      +---------+---------------+---------+-----------+----------+-------------------+ PFV      Full                                                             +---------+---------------+---------+-----------+----------+-------------------+ POP      Full           Yes      Yes                                      +---------+---------------+---------+-----------+----------+-------------------+ PTV      Partial        Yes      Yes                  Acute               +---------+---------------+---------+-----------+----------+-------------------+ PERO                                                  Not visualized due  to vessel depth     +---------+---------------+---------+-----------+----------+-------------------+     Summary: RIGHT: - There is no evidence of deep vein thrombosis in the lower extremity.  - No cystic structure found in the popliteal fossa.  LEFT: - Findings consistent with acute deep vein thrombosis involving the left posterior tibial veins. - No cystic structure found in the popliteal fossa.  *See table(s) above for  measurements and observations. Electronically signed by Deitra Mayo MD on 04/25/2022 at 2:50:57 PM.    Final    ECHOCARDIOGRAM COMPLETE BUBBLE STUDY  Result Date: 04/25/2022    ECHOCARDIOGRAM REPORT   Patient Name:   TAMAIA BENKE Date of Exam: 04/25/2022 Medical Rec #:  [id number]          Height:       71.0 in Accession #:    [id number]         Weight:       339.0 lb Date of Birth:  [birth year]          BSA:          2.639 m Patient Age:    57 years           BP:           108/75 mmHg Patient Gender: F                  HR:           75 bpm. Exam Location:  Inpatient Procedure: 2D Echo, Cardiac Doppler, Color Doppler, Intracardiac Opacification            Agent and Saline Contrast Bubble Study STAT ECHO Indications:    Pulmonary embolism  History:        Patient has prior history of Echocardiogram examinations, most                 recent 09/28/2020. Pulmonary HTN; Risk Factors:Hypertension.                 Sarcoid, cardiac.  Sonographer:    Eartha Inch Referring Phys: [id number] JASON MESNER  Sonographer Comments: Technically difficult study due to poor echo windows and patient is obese. Image acquisition challenging due to patient body habitus and Image acquisition challenging due to respiratory motion. IMPRESSIONS  1. Left ventricular ejection fraction, by estimation, is 65 to 70%. The left ventricle has hyperdynamic function. The left ventricle has no regional wall motion abnormalities. There is mild concentric left ventricular hypertrophy. Left ventricular diastolic parameters were normal.  2. Right ventricular systolic function is moderately reduced. The right ventricular size is mildly enlarged. Tricuspid regurgitation signal is inadequate for assessing PA pressure.  3. Left atrial size was mildly dilated.  4. Right atrial size was mildly dilated.  5. The mitral valve is normal in structure. No evidence of mitral valve regurgitation.  6. The aortic valve is tricuspid. There is mild thickening of  the aortic valve. Aortic valve regurgitation is not visualized. Aortic valve sclerosis is present, with no evidence of aortic valve stenosis.  7. The inferior vena cava is dilated in size with >50% respiratory variability, suggesting right atrial pressure of 8 mmHg.  8. Agitated saline contrast bubble study was negative, with no evidence of any interatrial shunt. Comparison(s): Prior images reviewed side by side. The right ventricular systolic function is significantly worse. FINDINGS  Left Ventricle: Left ventricular ejection fraction, by estimation, is 65 to 70%. The left ventricle has hyperdynamic function. The left ventricle has no regional wall motion abnormalities.  Definity contrast agent was given IV to delineate the left ventricular endocardial borders. The left ventricular internal cavity size was normal in size. There is mild concentric left ventricular hypertrophy. Left ventricular diastolic parameters were normal. Right Ventricle: McConnell's sign is present, consistent with hemodynamically significant pulmonary embolism. The right ventricular size is mildly enlarged. No increase in right ventricular wall thickness. Right ventricular systolic function is moderately reduced. Tricuspid regurgitation signal is inadequate for assessing PA pressure. Left Atrium: Left atrial size was mildly dilated. Right Atrium: Right atrial size was mildly dilated. Pericardium: Trivial pericardial effusion is present. Mitral Valve: The mitral valve is normal in structure. No evidence of mitral valve regurgitation. MV peak gradient, 2.0 mmHg. The mean mitral valve gradient is 1.0 mmHg. Tricuspid Valve: The tricuspid valve is normal in structure. Tricuspid valve regurgitation is trivial. Aortic Valve: The aortic valve is tricuspid. There is mild thickening of the aortic valve. Aortic valve regurgitation is not visualized. Aortic valve sclerosis is present, with no evidence of aortic valve stenosis. Pulmonic Valve: The pulmonic  valve was normal in structure. Pulmonic valve regurgitation is trivial. No evidence of pulmonic stenosis. Aorta: The aortic root and ascending aorta are structurally normal, with no evidence of dilitation. Venous: The inferior vena cava is dilated in size with greater than 50% respiratory variability, suggesting right atrial pressure of 8 mmHg. IAS/Shunts: No atrial level shunt detected by color flow Doppler. Agitated saline contrast was given intravenously to evaluate for intracardiac shunting. Agitated saline contrast bubble study was negative, with no evidence of any interatrial shunt.  LEFT VENTRICLE PLAX 2D LVIDd:         4.20 cm   Diastology LVIDs:         2.80 cm   LV e' medial:    9.68 cm/s LV PW:         1.20 cm   LV E/e' medial:  5.2 LV IVS:        1.30 cm   LV e' lateral:   9.79 cm/s LVOT diam:     2.20 cm   LV E/e' lateral: 5.1 LV SV:         90 LV SV Index:   34 LVOT Area:     3.80 cm  RIGHT VENTRICLE            IVC RV S prime:     7.29 cm/s  IVC diam: 2.10 cm TAPSE (M-mode): 1.5 cm LEFT ATRIUM             Index        RIGHT ATRIUM           Index LA diam:        3.90 cm 1.48 cm/m   RA Area:     19.70 cm LA Vol (A2C):   52.4 ml 19.86 ml/m  RA Volume:   64.20 ml  24.33 ml/m LA Vol (A4C):   58.2 ml 22.06 ml/m LA Biplane Vol: 56.6 ml 21.45 ml/m  AORTIC VALVE LVOT Vmax:   126.00 cm/s LVOT Vmean:  82.900 cm/s LVOT VTI:    0.236 m  AORTA Ao Root diam: 3.10 cm Ao Asc diam:  3.40 cm MITRAL VALVE MV Area (PHT): 2.25 cm    SHUNTS MV Area VTI:   3.80 cm    Systemic VTI:  0.24 m MV Peak grad:  2.0 mmHg    Systemic Diam: 2.20 cm MV Mean grad:  1.0 mmHg MV Vmax:       0.71 m/s  MV Vmean:      47.4 cm/s MV Decel Time: 337 msec MV E velocity: 49.90 cm/s MV A velocity: 51.60 cm/s MV E/A ratio:  0.97 Mihai Croitoru MD Electronically signed by Sanda Klein MD Signature Date/Time: 04/25/2022/9:05:01 AM    Final    CT Angio Chest PE W and/or Wo Contrast  Result Date: 04/25/2022 CLINICAL DATA:  Pulmonary  embolism (PE) suspected, low to intermediate prob, positive D-dimer. Sarcoidosis EXAM: CT ANGIOGRAPHY CHEST WITH CONTRAST TECHNIQUE: Multidetector CT imaging of the chest was performed using the standard protocol during bolus administration of intravenous contrast. Multiplanar CT image reconstructions and MIPs were obtained to evaluate the vascular anatomy. RADIATION DOSE REDUCTION: This exam was performed according to the departmental dose-optimization program which includes automated exposure control, adjustment of the mA and/or kV according to patient size and/or use of iterative reconstruction technique. CONTRAST:  42m OMNIPAQUE IOHEXOL 350 MG/ML SOLN COMPARISON:  07/16/2021 FINDINGS: Cardiovascular: There is adequate opacification of the pulmonary arterial tree. There are multiple branching intraluminal filling defects within the lobar and segmental pulmonary arteries bilaterally in keeping with acute pulmonary embolus. The embolic burden is moderate. Central pulmonary arteries are enlarged in keeping with changes of pulmonary arterial hypertension. There is relative enlargement of the right ventricle and reversal of the normal RV/LV ratio (RV/LV equals 1.47) in keeping with changes of right heart strain. No significant coronary artery calcification. Global cardiac size within normal limits. No pericardial effusion. The thoracic aorta is unremarkable. Mediastinum/Nodes: Visualized thyroid is unremarkable. No pathologic thoracic adenopathy. Esophagus unremarkable. Lungs/Pleura: Evaluation of the pulmonary parenchyma is limited by respiratory motion artifact. Minimal scattered nodular infiltrates are seen within the upper lobes bilaterally which may be infectious or inflammatory in the acute setting. No pneumothorax or pleural effusion. Upper Abdomen: No acute abnormality. Musculoskeletal: No chest wall abnormality. No acute or significant osseous findings. Review of the MIP images confirms the above findings.  IMPRESSION: 1. Acute pulmonary embolus with moderate embolic burden and CT evidence of right heart strain (RV/LV equals 1.47). 2. Minimal scattered nodular infiltrates within the upper lobes bilaterally which may be infectious or inflammatory in the acute setting. These results were called by telephone at the time of interpretation on 04/25/2022 at 12:42 am to provider Mesner, MD, who verbally acknowledged these results. Electronically Signed   By: AFidela SalisburyM.D.   On: 04/25/2022 00:42   DG Chest 2 View  Result Date: 04/24/2022 CLINICAL DATA:  Short of breath EXAM: CHEST - 2 VIEW COMPARISON:  None Available. FINDINGS: Normal mediastinum and cardiac silhouette. Low lung volumes. Central venous congestion. No focal consolidation. No pneumothorax. IMPRESSION: Low lung volumes and central venous congestion. Electronically Signed   By: SSuzy BouchardM.D.   On: 04/24/2022 17:59     Time coordinating discharge: Over 30 minutes    DDwyane Dee MD  [facility] 04/26/2022, 4:00 PM

## 2022-04-26 NOTE — Discharge Instructions (Signed)
  ____________________________________________________________________________________________________________________________ Information on my medicine - ELIQUIS (apixaban)  This medication education was reviewed with me or my healthcare representative as part of my discharge preparation.  The pharmacist that spoke with me during my hospital stay was:  Kaleen Mask, [facility]  Why was Eliquis prescribed for you? Eliquis was prescribed to treat blood clots that may have been found in the veins of your legs (deep vein thrombosis) or in your lungs (pulmonary embolism) and to reduce the risk of them occurring again.  What do You need to know about Eliquis ? The starting dose is 10 mg (two 5 mg tablets) taken TWICE daily for the FIRST SEVEN (7) DAYS, then on [facility] 05/03/22  the dose is reduced to ONE 5 mg tablet taken TWICE daily.  Eliquis may be taken with or without food.   Try to take the dose about the same time in the morning and in the evening. If you have difficulty swallowing the tablet whole please discuss with your pharmacist how to take the medication safely.  Take Eliquis exactly as prescribed and DO NOT stop taking Eliquis without talking to the doctor who prescribed the medication.  Stopping may increase your risk of developing a new blood clot.  Refill your prescription before you run out.  After discharge, you should have regular check-up appointments with your healthcare provider that is prescribing your Eliquis.    What do you do if you miss a dose? If a dose of ELIQUIS is not taken at the scheduled time, take it as soon as possible on the same day and twice-daily administration should be resumed. The dose should not be doubled to make up for a missed dose.  Important Safety Information A possible side effect of Eliquis is bleeding. You should call your healthcare provider right away if you experience any of the following: Bleeding from an injury or your nose that does  not stop. Unusual colored urine (red or dark brown) or unusual colored stools (red or black). Unusual bruising for unknown reasons. A serious fall or if you hit your head (even if there is no bleeding).  Some medicines may interact with Eliquis and might increase your risk of bleeding or clotting while on Eliquis. To help avoid this, consult your healthcare provider or pharmacist prior to using any new prescription or non-prescription medications, including herbals, vitamins, non-steroidal anti-inflammatory drugs (NSAIDs) and supplements.  This website has more information on Eliquis (apixaban): http://www.eliquis.com/eliquis/home

## 2022-04-26 NOTE — Plan of Care (Signed)
POC open and progressing.

## 2022-04-26 NOTE — TOC Benefit Eligibility Note (Signed)
Patient Teacher, English as a foreign language completed.    The patient is currently admitted and upon discharge could be taking Eliquis Starter Pack.  The current 30 day co-pay is $11.20.   The patient is insured through [place], [place] Patient Advocate Specialist [place] Patient Advocate Team Direct Number: [phone]  Fax: [phone]

## 2022-04-26 NOTE — Progress Notes (Signed)
SATURATION QUALIFICATIONS: (This note is used to comply with regulatory documentation for home oxygen)  Patient Saturations on Room Air at Rest = 99%  Patient Saturations on Room Air while Ambulating = 96%  Patient Saturations on 0 Liters of oxygen while Ambulating = N/A  Please briefly explain why patient needs home oxygen:

## 2022-04-26 NOTE — Progress Notes (Signed)
TOC meds given to patient prior to D/C

## 2022-04-26 NOTE — TOC Transition Note (Signed)
Transition of Care (TOC) - CM/SW Discharge Note Marvetta Gibbons RN, BSN Transitions of Care Unit 4E- RN Case Manager See Treatment Team for direct phone #   Patient Details  Name: Julie Cruz MRN: [id number] Date of Birth: Jun 29, 1965  Transition of Care [facility] CM/SW Contact:  Dawayne Patricia, RN Phone Number: 04/26/2022, 3:55 PM   Clinical Narrative:    Pt stable for transition home, asked to see pt for [facility] questions by MD.  CM in to speak with pt at bedside- Pt asking about [facility] aide to assist her for about 4 hrs/day.  Confirmed with pt she has [facility], pt states she does not have Medicaid, draws disability check.  Discussed with pt that Medicare benefits do not cover for a [employer]CS aide to come, that falls under Medicaid benefits which pt does not have. Pt has had [place] services in the past for therapy- explained that Skilled services are covered under Medicare but not personal care services like she is wanting- pt would need to pay for this type out of pocket. Pt voiced she also needs transportation to get to places- explained that she should have transportation benefits with her insurance for medical appointments- pt voiced yes she did- but she wants access to something more. Pt voiced she has application for access [place]- encouraged pt to finish application process.   Noted pt to get meds from [place] for Eliquis start pack under 30 day coupon, per benefits check copay cost will be $11.20.   Pt voiced she has no further needs, RNCM to sign off.    Final next level of care: Home/Self Care Barriers to Discharge: No Barriers Identified   Patient Goals and CMS Choice CMS Medicare.gov Compare Post Acute Care list provided to:: Patient Choice offered to / list presented to : NA  Discharge Placement                 Home        Discharge Plan and Services Additional resources added to the After Visit Summary for   In-house Referral: NA Discharge Planning Services:  CM Consult Post Acute Care Choice: NA          DME Arranged: N/A DME Agency: NA       HH Arranged: NA HH Agency: NA        Social Determinants of Health (SDOH) Interventions SDOH Screenings   Tobacco Use: Medium Risk (04/25/2022)     Readmission Risk Interventions    04/26/2022    3:55 PM  Readmission Risk Prevention Plan  Post Dischage Appt Complete  Medication Screening Complete  Transportation Screening Complete

## 2022-04-26 NOTE — Progress Notes (Signed)
ANTICOAGULATION CONSULT NOTE  Pharmacy Consult for Heparin transition to Apixaban Indication: pulmonary embolus  No Known Allergies  Patient Measurements: Height: '5\' 11"'$  (180.3 cm) Weight: (!) 154.2 kg (339 lb 15.2 oz) IBW/kg (Calculated) : 70.8  Vital Signs: Temp: 98.1 F (36.7 C) (02/28 0445) Temp Source: Oral (02/28 0445) BP: 132/95 (02/28 0748) Pulse Rate: 75 (02/28 0748)  Labs: Recent Labs    04/24/22 1659 04/24/22 2045 04/25/22 0250 04/25/22 0430 04/25/22 0524 04/25/22 1126 04/25/22 1806 04/26/22 0116  HGB 12.5  --  11.9*  --  12.6  --   --  11.4*  HCT 37.4  --  35.8*  --  37.0  --   --  34.4*  PLT 147*  --  142*  --   --   --   --  133*  HEPARINUNFRC  --   --   --   --   --  0.52 0.42 0.48  CREATININE 1.05*  --  1.13*  --   --   --   --  1.05*  TROPONINIHS 27* 46*  --  23*  --   --   --   --      Estimated Creatinine Clearance: 99.6 mL/min (A) (by C-G formula based on SCr of 1.05 mg/dL (H)).   Medical History: Past Medical History:  Diagnosis Date   Acute medial meniscus tear    Anxiety    Bipolar 1 disorder (HCC)    Depression    Fibromyalgia    H/O spinal cord injury    T, L ans CSpine   Hypertension    Menopause    Pulmonary hypertension (HCC)    Sarcoid, cardiac     Assessment: 57 y/o F with progressive shortness of breath for 2 weeks, found to have new onset PE on CT angio, starting heparin, labs above reviewed, PTA meds reviewed.   Heparin level 0.48 units/mL (therapeutic) on heparin 1500 units/hr. CBC stable. Plt stable. No signs of bleeding.  Goal of Therapy:  Heparin level 0.3-0.7 units/ml Monitor platelets by anticoagulation protocol: Yes   Plan:  Discontinue heparin infusion at the time of first apixaban dose administration. Start apixaban '10mg'$  PO BID x 7 days, followed by '5mg'$  PO BID from 05/03/22 onward Daily CBC and heparin level Pharmacy to provide patient education for apixaban prior to discharge  Luisa Hart, PharmD,  BCPS Clinical Pharmacist 04/26/2022 12:28 PM   Please refer to [facility] for pharmacy phone number

## 2022-04-26 NOTE — Plan of Care (Signed)
  Problem: Education: Goal: Knowledge of General Education information will improve Description: Including pain rating scale, medication(s)/side effects and non-pharmacologic comfort measures Outcome: Adequate for Discharge   Problem: Clinical Measurements: Goal: Ability to maintain clinical measurements within normal limits will improve Outcome: Adequate for Discharge Goal: Will remain free from infection Outcome: Adequate for Discharge Goal: Diagnostic test results will improve Outcome: Adequate for Discharge Goal: Respiratory complications will improve Outcome: Adequate for Discharge Goal: Cardiovascular complication will be avoided Outcome: Adequate for Discharge   Problem: Activity: Goal: Risk for activity intolerance will decrease Outcome: Adequate for Discharge   Problem: Nutrition: Goal: Adequate nutrition will be maintained Outcome: Adequate for Discharge   Problem: Coping: Goal: Level of anxiety will decrease Outcome: Adequate for Discharge   Problem: Elimination: Goal: Will not experience complications related to bowel motility Outcome: Adequate for Discharge Goal: Will not experience complications related to urinary retention Outcome: Adequate for Discharge   Problem: Safety: Goal: Ability to remain free from injury will improve Outcome: Adequate for Discharge

## 2022-04-28 ENCOUNTER — Telehealth: Payer: Self-pay | Admitting: Pulmonary Disease

## 2022-04-28 NOTE — Telephone Encounter (Signed)
Patient states oxygen levels are low. Also has questions about Eliquis. Was in the hospital. Patient phone number is [phone].

## 2022-05-01 NOTE — Telephone Encounter (Signed)
Called and spoke with patient. She stated that she was recently in the hospital for a PE and low oxygen sats. She was discharged on Eliquis. She is already using 2L of O2 and bipap at night. She was calling to get scheduled for a HFU. I offered an appt with one of our NPs but she wanted to see Dr. Erin Fulling only. She has been scheduled for 3/19 at 215pm (30 min slot).   Nothing further needed at time of call.

## 2022-05-02 ENCOUNTER — Telehealth: Payer: Self-pay | Admitting: Pulmonary Disease

## 2022-05-02 NOTE — Telephone Encounter (Signed)
Wants recommendations for pain relief cream

## 2022-05-03 NOTE — Telephone Encounter (Signed)
Patient was recently discharged from the hospital and on AVS it advised for her not use icy hot. She wants to know what else she can use. She advises she is in a lot of pain and would like some relief. She doesn't understand why she can't use the icy hot any more.   Dr. Erin Fulling can you please advise

## 2022-05-05 NOTE — Telephone Encounter (Signed)
Called and spoke with pt letting her know the info per JD and she verbalized understanding. Nothing further needed. ?

## 2022-05-05 NOTE — Telephone Encounter (Signed)
Patient should discuss this issue with her primary care doctor.  Thanks,  JD

## 2022-05-09 ENCOUNTER — Ambulatory Visit: Payer: Medicare HMO | Attending: Interventional Cardiology | Admitting: Cardiology

## 2022-05-09 ENCOUNTER — Ambulatory Visit: Payer: Medicare HMO | Admitting: Interventional Cardiology

## 2022-05-09 ENCOUNTER — Encounter: Payer: Self-pay | Admitting: Cardiology

## 2022-05-09 VITALS — BP 134/76 | HR 94 | Ht 71.0 in | Wt 337.0 lb

## 2022-05-09 DIAGNOSIS — Z79899 Other long term (current) drug therapy: Secondary | ICD-10-CM | POA: Diagnosis not present

## 2022-05-09 DIAGNOSIS — I10 Essential (primary) hypertension: Secondary | ICD-10-CM

## 2022-05-09 DIAGNOSIS — R002 Palpitations: Secondary | ICD-10-CM

## 2022-05-09 DIAGNOSIS — I2609 Other pulmonary embolism with acute cor pulmonale: Secondary | ICD-10-CM | POA: Diagnosis not present

## 2022-05-09 DIAGNOSIS — R9431 Abnormal electrocardiogram [ECG] [EKG]: Secondary | ICD-10-CM

## 2022-05-09 DIAGNOSIS — D869 Sarcoidosis, unspecified: Secondary | ICD-10-CM

## 2022-05-09 DIAGNOSIS — Z01812 Encounter for preprocedural laboratory examination: Secondary | ICD-10-CM

## 2022-05-09 MED ORDER — APIXABAN 5 MG PO TABS: 5.0000 mg | ORAL_TABLET | Freq: Two times a day (BID) | ORAL | 5 refills | Status: DC

## 2022-05-09 NOTE — Patient Instructions (Addendum)
Medication Instructions:  DECREASE hydrochlorothiazide to '25mg'$  daily  *If you need a refill on your cardiac medications before your next appointment, please call your pharmacy*   Lab Work: NON-FASTING lab work 3/20 - 3/22   If you have labs (blood work) drawn today and your tests are completely normal, you will receive your results only by: [place] (if you have MyChart) OR A paper copy in the mail If you have any lab test that is abnormal or we need to change your treatment, we will call you to review the results.   Testing/Procedures: Cardiac MRI -- [facility]  Cardiac PET Sarcoid -- [facility]   Follow-Up: At [facility], you and your health needs are our priority.  As part of our continuing mission to provide you with exceptional heart care, we have created designated Provider Care Teams.  These Care Teams include your primary Cardiologist (physician) and Advanced Practice Providers (APPs -  Physician Assistants and Nurse Practitioners) who all work together to provide you with the care you need, when you need it.  We recommend signing up for the patient portal called "MyChart".  Sign up information is provided on this After Visit Summary.  MyChart is used to connect with patients for Virtual Visits (Telemedicine).  Patients are able to view lab/test results, encounter notes, upcoming appointments, etc.  Non-urgent messages can be sent to your provider as well.   To learn more about what you can do with MyChart, go to [email or web address].    Your next appointment:    3 months with Dr. Gardiner Rhyme  Other Instructions  Dr. Gardiner Rhyme recommends checking your BP at home OMRON upper arm cuff is a preferred brand Please check BP twice daily for one week and call with readings or send in MyChart     You are scheduled for Cardiac MRI on ______________. Please arrive for your appointment at ______________ ( arrive 30-45 minutes prior to test start time).  ?  [facility] [address] [place], [place] [address] [phone] Please take advantage of the free valet parking available at the MAIN entrance (A entrance).  Proceed to the [facility] Radiology Department (First Floor) for check-in.   [facility] [place], [place] [address] [phone] Please take advantage of the free valet parking available at the MAIN entrance. Proceed to [facility] registration for check-in (first floor).  Magnetic resonance imaging (MRI) is a painless test that produces images of the inside of the body without using Xrays.  During an MRI, strong magnets and radio waves work together in a Research officer, political party to form detailed images.   MRI images may provide more details about a medical condition than X-rays, CT scans, and ultrasounds can provide.  You may be given earphones to listen for instructions.  You may eat a light breakfast and take medications as ordered with the exception of furosemide, hydrochlorothiazide, or spironolactone(fluid pill, other). Please avoid stimulants for 12 hr prior to test. (Ie. Caffeine, nicotine, chocolate, or antihistamine medications)  If a contrast material will be used, an IV will be inserted into one of your veins. Contrast material will be injected into your IV. It will leave your body through your urine within a day. You may be told to drink plenty of fluids to help flush the contrast material out of your system.  You will be asked to remove all metal, including: Watch, jewelry, and other metal objects including hearing aids, hair  pieces and dentures. Also wearable glucose monitoring systems (ie. Freestyle Libre and Omnipods) (Braces and fillings normally are not a problem.)   TEST WILL TAKE APPROXIMATELY 1 HOUR  PLEASE NOTIFY SCHEDULING AT LEAST 24 HOURS IN ADVANCE IF YOU ARE UNABLE TO KEEP YOUR APPOINTMENT. [phone]  Please call Marchia Bond, cardiac imaging  nurse navigator with any questions/concerns. Marchia Bond RN Navigator Cardiac Imaging Gordy Clement RN Navigator Cardiac Imaging [facility] [phone] Office     How to Prepare for Your Cardiac PET/CT Stress Test:  1. Please do not take these medications before your test:   Medications that may interfere with the cardiac pharmacological stress agent (ex. nitrates - including erectile dysfunction medications, isosorbide mononitrate, tamulosin or beta-blockers) the day of the exam. (Erectile dysfunction medication should be held for at least 72 hrs prior to test) Theophylline containing medications for 12 hours. Dipyridamole 48 hours prior to the test. Your remaining medications may be taken with water.  2. Nothing to eat or drink, except water, 3 hours prior to arrival time.   NO caffeine/decaffeinated products, or chocolate 12 hours prior to arrival.  3. NO perfume, cologne or lotion  4. Total time is 1 to 2 hours; you may want to bring reading material for the waiting time  5. Please report to Radiology at the [facility] Main Entrance 30 minutes early for your test.  [address] [place], [place] [address]  Diabetic Preparation:  Hold oral medications. You may take NPH and Lantus insulin. Do not take Humalog or Humulin R (Regular Insulin) the day of your test. Check blood sugars prior to leaving the house. If able to eat breakfast prior to 3 hour fasting, you may take all medications, including your insulin, Do not worry if you miss your breakfast dose of insulin - start at your next meal.  IF YOU THINK YOU MAY BE PREGNANT, OR ARE NURSING PLEASE INFORM THE TECHNOLOGIST.  In preparation for your appointment, medication and supplies will be purchased.  Appointment availability is limited, so if you need to cancel or reschedule, please call the Radiology Department at [phone]  24 hours in advance to avoid a cancellation fee of  $100.00  What to Expect After you Arrive:  Once you arrive and check in for your appointment, you will be taken to a preparation room within the Radiology Department.  A technologist or Nurse will obtain your medical history, verify that you are correctly prepped for the exam, and explain the procedure.  Afterwards,  an IV will be started in your arm and electrodes will be placed on your skin for EKG monitoring during the stress portion of the exam. Then you will be escorted to the PET/CT scanner.  There, staff will get you positioned on the scanner and obtain a blood pressure and EKG.  During the exam, you will continue to be connected to the EKG and blood pressure machines.  A small, safe amount of a radioactive tracer will be injected in your IV to obtain a series of pictures of your heart along with an injection of a stress agent.    After your Exam:  It is recommended that you eat a meal and drink a caffeinated beverage to counter act any effects of the stress agent.  Drink plenty of fluids for the remainder of the day and urinate frequently for the first couple of hours after the exam.  Your doctor will inform you of your test results  within 7-10 business days.  For questions about your test or how to prepare for your test, please call: Marchia Bond, Cardiac Imaging Nurse Navigator  Gordy Clement, Cardiac Imaging Nurse Navigator Office: [phone]

## 2022-05-09 NOTE — Progress Notes (Unsigned)
Cardiology Office Note:    Date:  05/10/2022   ID:  Julie Cruz, DOB 1966-02-22, MRN [id number]  PCP:  Lin Landsman, MD  Cardiologist:  None  Electrophysiologist:  None   Referring MD: Lin Landsman, MD   Chief Complaint  Patient presents with   Sarcoidosis    History of Present Illness:    Julie Cruz is a 57 y.o. female with a hx of sarcoidosis with cardiac involvement, PE, pulmonary hypertension, hypertension, fibromyalgia who is referred by Dr. Curt Bears for evaluation of cardiac sarcoid.  Previously followed at [facility].  Diagnosed with sarcoid in 2007 via endobronchial biopsy.  Cardiac MRI 10/2016 showed normal biventricular size and systolic function, mid septal linear intramyocardial delayed enhancement.  Had PET scan in 2020 that showed no cardiac activity (prior study had shown mild uptake).  LHC in 2019 showed no obstructive CAD.  She had an episode of syncope and had loop implanted.  Device reached RRT, she elected not to have it explanted.  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  She has been on methotrexate by ophthalmology.  She was admitted 03/2022 with worsening shortness of breath.  CTPA showed bilateral PE and lower extremity duplex showed left lower extremity DVT.  Echo showed EF 65 to 70%, moderate RV dysfunction with mild RV enlargement.  She was started on heparin drip and transition to Eliquis on discharge.  States that she was having lightheadedness, chest pain, and shortness of breath.  All symptoms have improved since starting treatment for her PE.  She smoked 1 pack/week for over 20 years, quit when diagnosed with sarcoid in 2007.  No family history of cardiac disease.  Zio patch x 14 days 01/2022 showed no significant arrhythmias.  Echocardiogram 03/2022 showed EF 65 to 70%, mild LVH, normal diastolic function, moderate RV dysfunction.     Past Medical History:  Diagnosis Date   Acute medial meniscus tear    Anxiety    Bipolar 1 disorder  (HCC)    Depression    Fibromyalgia    H/O spinal cord injury    T, L ans CSpine   Hypertension    Menopause    Pulmonary hypertension (HCC)    Sarcoid, cardiac     Past Surgical History:  Procedure Laterality Date   C Section     Catherization      x2   COLONOSCOPY WITH PROPOFOL N/A 10/15/2020   Procedure: COLONOSCOPY WITH PROPOFOL;  Surgeon: Carol Ada, MD;  Location: [facility];  Service: Endoscopy;  Laterality: N/A;   Etopic  pregnacy     LUNG BIOPSY     TUBAL LIGATION      Current Medications: Current Meds  Medication Sig   acetaminophen (TYLENOL) 650 MG CR tablet Take 650 mg by mouth every 8 (eight) hours as needed for pain.   apixaban (ELIQUIS) 5 MG TABS tablet Take 1 tablet (5 mg total) by mouth 2 (two) times daily.   APIXABAN (ELIQUIS) VTE STARTER PACK ('10MG'$  AND '5MG'$ ) Take as directed on package: start with two-'5mg'$  tablets twice daily for 7 days. On day 8, switch to one-'5mg'$  tablet twice daily.   clonazePAM (KLONOPIN) 0.5 MG tablet Take 0.5 mg by mouth daily.   cyclobenzaprine (FLEXERIL) 10 MG tablet Take 10-20 mg by mouth daily as needed for muscle spasms.   Difluprednate 0.05 % EMUL Apply 1 drop to eye 4 (four) times daily. Into the right eye.   dorzolamide (TRUSOPT) 2 % ophthalmic solution Place  1 drop into the right eye 3 (three) times daily.   folic acid (FOLVITE) 1 MG tablet Take 1 tablet by mouth daily.   hydrochlorothiazide (HYDRODIURIL) 50 MG tablet Take 25 mg by mouth in the morning.   ipratropium-albuterol (DUONEB) 0.5-2.5 (3) MG/3ML SOLN Take 3 mLs by nebulization every 6 (six) hours as needed.   lisinopril (ZESTRIL) 40 MG tablet Take 40 mg by mouth in the morning.   LUMIGAN 0.01 % SOLN Place 1 drop into both eyes at bedtime.   methotrexate (RHEUMATREX) 2.5 MG tablet Take 25 mg by mouth once a week. Caution:Chemotherapy. Protect from light. 10 tablets   naproxen (NAPROSYN) 500 MG tablet Take 1,000 mg by mouth daily as needed (pain.).    oxyCODONE-acetaminophen (PERCOCET) 10-325 MG tablet Take 1 tablet by mouth 3 (three) times daily.   pantoprazole (PROTONIX) 40 MG tablet Take 40 mg by mouth in the morning.   potassium chloride (KLOR-CON) 10 MEQ tablet Take 20 mEq by mouth in the morning.   sertraline (ZOLOFT) 100 MG tablet Take 200 mg by mouth in the morning.   traZODone (DESYREL) 50 MG tablet Take 50 mg by mouth at bedtime.   verapamil (VERELAN PM) 360 MG 24 hr capsule Take 360 mg by mouth in the morning.     Allergies:   Patient has no known allergies.   Social History   Socioeconomic History   Marital status: Single    Spouse name: Not on file   Number of children: Not on file   Years of education: Not on file   Highest education level: Not on file  Occupational History   Not on file  Tobacco Use   Smoking status: Former    Types: Cigarettes    Quit date: 2007    Years since quitting: 17.2   Smokeless tobacco: Never  Vaping Use   Vaping Use: Never used  Substance and Sexual Activity   Alcohol use: Yes    Alcohol/week: 2.0 standard drinks of alcohol    Types: 2 Glasses of wine per week    Comment: Drinks wine occasional   Drug use: Never   Sexual activity: Not Currently  Other Topics Concern   Not on file  Social History Narrative   Right Handed    Lives in a one story home - [place]    Social Determinants of Health   Financial Resource Strain: Not on file  Food Insecurity: Not on file  Transportation Needs: Not on file  Physical Activity: Not on file  Stress: Not on file  Social Connections: Not on file     Family History: The patient's family history includes Asthma in her mother; Cancer in her father; Depression in her brother and mother; Diabetes in her father; HIV/AIDS in her brother; Heart attack in her brother; Hyperlipidemia in her mother; Rheum arthritis in her mother; Thyroid disease in her mother.  ROS:   Please see the history of present illness.     All other systems  reviewed and are negative.  EKGs/Labs/Other Studies Reviewed:    The following studies were reviewed today:   EKG:   05/09/22: NSR, rate 94, nonspecific T wave flattening, QTC 492  Recent Labs: 04/25/2022: ALT 50; B Natriuretic Peptide 364.8 04/26/2022: BUN 12; Creatinine, Ser 1.05; Hemoglobin 11.4; Magnesium 1.9; Platelets 133; Potassium 3.5; Sodium 141  Recent Lipid Panel No results found for: "CHOL", "TRIG", "HDL", "CHOLHDL", "VLDL", "LDLCALC", "LDLDIRECT"  Physical Exam:    VS:  BP 134/76  Pulse 94   Ht '5\' 11"'$  (1.803 m)   Wt (!) 337 lb (152.9 kg)   SpO2 96%   BMI 47.00 kg/m     Wt Readings from Last 3 Encounters:  05/09/22 (!) 337 lb (152.9 kg)  04/26/22 (!) 339 lb 15.2 oz (154.2 kg)  01/26/22 (!) 336 lb (152.4 kg)     GEN:  Well nourished, well developed in no acute distress HEENT: Normal NECK: No JVD; No carotid bruits LYMPHATICS: No lymphadenopathy CARDIAC: RRR, no murmurs, rubs, gallops RESPIRATORY:  Clear to auscultation without rales, wheezing or rhonchi  ABDOMEN: Soft, non-tender, non-distended MUSCULOSKELETAL:  No edema; No deformity  SKIN: Warm and dry NEUROLOGIC:  Alert and oriented x 3 PSYCHIATRIC:  Normal affect   ASSESSMENT:    1. Sarcoidosis   2. Pre-procedure lab exam   3. Medication management   4. Other acute pulmonary embolism with acute cor pulmonale ([place])   5. Essential hypertension   6. Palpitations   7. Prolonged QT interval    PLAN:    Sarcoidosis: Diagnosed with sarcoid in 2007 via endobronchial biopsy.  Cardiac MRI 10/2016 showed normal biventricular size and systolic function, mid septal linear intramyocardial delayed enhancement.  Had PET scan in 2020 that showed no cardiac activity (prior study had shown mild uptake).  LHC in 2019 showed no obstructive CAD.  She had an episode of syncope and had loop implanted.  No significant arrhythmias seen, device reached RRT, she elected not to have it explanted.  She underwent eye surgery on  03/08/22 due to uveitis.  She has been on methotrexate by ophthalmology.  Echocardiogram 03/2022 showed EF 65 to 70%, mild LVH, normal diastolic function, moderate RV dysfunction.   -Cardiac MRI -PET scan to evaluate for active inflammation  Acute PE: Diagnosed during admission 03/2022.  On Eliquis 5 mg twice daily.  Moderate RV dysfunction on echo 03/2022, will evaluate for improvement on cardiac MRI as above  Palpitations: Zio patch x 14 days 01/2022 showed no significant arrhythmias.  Reports palpitations have improved  Hypertension: on HCTZ 50 mg daily and lisinopril 40 mg daily and verapamil 360 mg daily.  Recommend reducing HCTZ to 25 mg daily as higher doses of HCTZ likely not helping with BP and more likely to cause electrolyte abnormalities.  Prolonged QTc: Has had low potassium/magnesium, likely due to HCTZ use.  Will decrease HCTZ dose as above.  Check BMET/magnesium  RTC in 3 months  Medication Adjustments/Labs and Tests Ordered: Current medicines are reviewed at length with the patient today.  Concerns regarding medicines are outlined above.  Orders Placed This Encounter  Procedures   NM PET CT MYOCARDIAL SARCOIDOSIS   MR CARDIAC MORPHOLOGY W WO CONTRAST   Basic metabolic panel   Magnesium   EKG 12-Lead   Meds ordered this encounter  Medications   apixaban (ELIQUIS) 5 MG TABS tablet    Sig: Take 1 tablet (5 mg total) by mouth 2 (two) times daily.    Dispense:  60 tablet    Refill:  5    START this after finishing the starter dose pack    Patient Instructions  Medication Instructions:  DECREASE hydrochlorothiazide to '25mg'$  daily  *If you need a refill on your cardiac medications before your next appointment, please call your pharmacy*   Lab Work: NON-FASTING lab work 3/20 - 3/22   If you have labs (blood work) drawn today and your tests are completely normal, you will receive your results only by: [employer] (if  you have MyChart) OR A paper copy in the  mail If you have any lab test that is abnormal or we need to change your treatment, we will call you to review the results.   Testing/Procedures: Cardiac MRI -- [facility]  Cardiac PET Sarcoid -- [facility]   Follow-Up: At [facility], you and your health needs are our priority.  As part of our continuing mission to provide you with exceptional heart care, we have created designated Provider Care Teams.  These Care Teams include your primary Cardiologist (physician) and Advanced Practice Providers (APPs -  Physician Assistants and Nurse Practitioners) who all work together to provide you with the care you need, when you need it.  We recommend signing up for the patient portal called "MyChart".  Sign up information is provided on this After Visit Summary.  MyChart is used to connect with patients for Virtual Visits (Telemedicine).  Patients are able to view lab/test results, encounter notes, upcoming appointments, etc.  Non-urgent messages can be sent to your provider as well.   To learn more about what you can do with MyChart, go to [email or web address].    Your next appointment:    3 months with Dr. Gardiner Rhyme  Other Instructions  Dr. Gardiner Rhyme recommends checking your BP at home OMRON upper arm cuff is a preferred brand Please check BP twice daily for one week and call with readings or send in MyChart     You are scheduled for Cardiac MRI on ______________. Please arrive for your appointment at ______________ ( arrive 30-45 minutes prior to test start time). ?  [facility] [address] [place], [place] [address] [phone] Please take advantage of the free valet parking available at the MAIN entrance (A entrance).  Proceed to the [facility] Radiology Department (First Floor) for check-in.   [facility] [place], [place] [address] [phone] Please take advantage of the free valet parking  available at the MAIN entrance. Proceed to [facility] registration for check-in (first floor).  Magnetic resonance imaging (MRI) is a painless test that produces images of the inside of the body without using Xrays.  During an MRI, strong magnets and radio waves work together in a [employer], political party to form detailed images.   MRI images may provide more details about a medical condition than X-rays, CT scans, and ultrasounds can provide.  You may be given earphones to listen for instructions.  You may eat a light breakfast and take medications as ordered with the exception of furosemide, hydrochlorothiazide, or spironolactone(fluid pill, other). Please avoid stimulants for 12 hr prior to test. (Ie. Caffeine, nicotine, chocolate, or antihistamine medications)  If a contrast material will be used, an IV will be inserted into one of your veins. Contrast material will be injected into your IV. It will leave your body through your urine within a day. You may be told to drink plenty of fluids to help flush the contrast material out of your system.  You will be asked to remove all metal, including: Watch, jewelry, and other metal objects including hearing aids, hair pieces and dentures. Also wearable glucose monitoring systems (ie. Freestyle Libre and Omnipods) (Braces and fillings normally are not a problem.)   TEST WILL TAKE APPROXIMATELY 1 HOUR  PLEASE NOTIFY SCHEDULING AT LEAST 24 HOURS IN ADVANCE IF YOU ARE UNABLE TO KEEP YOUR APPOINTMENT. [phone]  Please call Marchia Bond, cardiac imaging nurse navigator with any questions/concerns. Clarise Cruz  Juleen China RN Navigator Cardiac Imaging Gordy Clement RN Navigator Cardiac Imaging [facility] [phone] Office     How to Prepare for Your Cardiac PET/CT Stress Test:  1. Please do not take these medications before your test:   Medications that may interfere with the cardiac pharmacological stress agent (ex. nitrates -  including erectile dysfunction medications, isosorbide mononitrate, tamulosin or beta-blockers) the day of the exam. (Erectile dysfunction medication should be held for at least 72 hrs prior to test) Theophylline containing medications for 12 hours. Dipyridamole 48 hours prior to the test. Your remaining medications may be taken with water.  2. Nothing to eat or drink, except water, 3 hours prior to arrival time.   NO caffeine/decaffeinated products, or chocolate 12 hours prior to arrival.  3. NO perfume, cologne or lotion  4. Total time is 1 to 2 hours; you may want to bring reading material for the waiting time  5. Please report to Radiology at the [facility] Main Entrance 30 minutes early for your test.  [address] [place], [place] [address]  Diabetic Preparation:  Hold oral medications. You may take NPH and Lantus insulin. Do not take Humalog or Humulin R (Regular Insulin) the day of your test. Check blood sugars prior to leaving the house. If able to eat breakfast prior to 3 hour fasting, you may take all medications, including your insulin, Do not worry if you miss your breakfast dose of insulin - start at your next meal.  IF YOU THINK YOU MAY BE PREGNANT, OR ARE NURSING PLEASE INFORM THE TECHNOLOGIST.  In preparation for your appointment, medication and supplies will be purchased.  Appointment availability is limited, so if you need to cancel or reschedule, please call the Radiology Department at [phone]  24 hours in advance to avoid a cancellation fee of $100.00  What to Expect After you Arrive:  Once you arrive and check in for your appointment, you will be taken to a preparation room within the Radiology Department.  A technologist or Nurse will obtain your medical history, verify that you are correctly prepped for the exam, and explain the procedure.  Afterwards,  an IV will be started in your arm and electrodes will be placed on your skin for EKG  monitoring during the stress portion of the exam. Then you will be escorted to the PET/CT scanner.  There, staff will get you positioned on the scanner and obtain a blood pressure and EKG.  During the exam, you will continue to be connected to the EKG and blood pressure machines.  A small, safe amount of a radioactive tracer will be injected in your IV to obtain a series of pictures of your heart along with an injection of a stress agent.    After your Exam:  It is recommended that you eat a meal and drink a caffeinated beverage to counter act any effects of the stress agent.  Drink plenty of fluids for the remainder of the day and urinate frequently for the first couple of hours after the exam.  Your doctor will inform you of your test results within 7-10 business days.  For questions about your test or how to prepare for your test, please call: Marchia Bond, Cardiac Imaging Nurse Navigator  Gordy Clement, Cardiac Imaging Nurse Navigator Office: [phone]            Signed, Donato Heinz, MD  05/10/2022 1:57 PM    [facility]

## 2022-05-11 ENCOUNTER — Encounter (HOSPITAL_COMMUNITY): Payer: Self-pay

## 2022-05-12 ENCOUNTER — Ambulatory Visit: Payer: Medicare HMO | Admitting: Interventional Cardiology

## 2022-05-16 ENCOUNTER — Inpatient Hospital Stay: Payer: Medicare HMO | Admitting: Pulmonary Disease

## 2022-05-16 NOTE — Progress Notes (Deleted)
f  Synopsis: Referred in August 2022 for Sarcoidosis   Subjective:   PATIENT ID: Julie Cruz GENDER: female DOB: 28-Aug-1965, MRN: [id number]  HPI  No chief complaint on file.  Julie Cruz is a 57 year old woman, former smoker with hypertension, OSA on bipap with 2LO2, pulmonary hypertension, fibromyalgia, uveitis and sarcoidosis who returns to pulmonary clinic for follow up of sarcoidosis.  She was admitted 2/27 to 2/28 for acute respiratory failure found to have bilateral pulmonary emboli and left DVT. She was treated with heparin drip and transitioned to eliquis.   She was seen by Dr. 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 She has been ordered for a MR cardiac scan and PET cardiac scan. She was feeling much better at this time since hospital discharge.   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   She has started bipap therapy and has been compliant based on her recent download.   She was seen in the ER 5/17 and 5/20 for dizziness and near syncope episodes. Evaluations were unrevealing and she reports no issues since those visits.   She currently has cold like symptoms with runny nose and nasal congestion.   OV 04/12/21 She has not started her symbicort inhaler as she is concerned about the side effects. She is using albuterol 2 to 4 times per day for shortness of breath, cough or wheezing. She recently had ophthalmology evaluation and they did not note inflammatory involvement related to sarcoidosis. She does have vision loss in her right eye due to retina issues and she is also being treated for glaucoma.   She was contacted by a nutritionist after last visit but this is not covered by her insurance. She has been  referred to the weight loss clinis.  OV 02/24/21 She tested positive for covid 19 on 12/13 and was placed on 12 day prednisone taper on 12/21 for her symptoms of cough and dyspnea. She is much improved at this time. She is using albuterol as needed for her wheezing or dyspnea and using it on a daily basis. She has not started the Symbicort inhaler that was sent in for her after the HRCT Chest was performed showing small airways disease.   PFTs from 10/22 show a restrictive defect with normal diffusion capacity. She is scheduled for split night sleep study 03/15/20. She has ophthalmology visit next week.   She brought in papers from [place]. [place] where she was prescribed methotrexate for sarcoidosis, uveitis and polyarthropathy. She was seen by ophthalmology and rheumatology.   OV 10/06/20 She reports being diagnosed with sarcoidosis at age 42 via lung biopsy in 2007 at [facility] in [place].  She was initially on prednisone for treatment but then due to side effects has stopped this therapy and has been weary of using methotrexate so she has not required systemic therapy over the last few years.  She denies progressive dyspnea since deciding not to have systemic treatment of her sarcoidosis. She is currently using albuterol twice daily for shortness of breath.  She was previously on Advair but did not like this inhaler.   She has history of obstructive sleep apnea and has a CPAP machine but has not used it in months as it is one of the machines included in the  recent recall.  She has gained 20 to 40 pounds over the last couple of years.  Past Medical History:  Diagnosis Date   Acute medial meniscus tear    Anxiety    Bipolar 1 disorder (HCC)    Depression    Fibromyalgia    H/O spinal cord injury    T, L ans CSpine   Hypertension    Menopause    Pulmonary hypertension ([place])    Sarcoid, cardiac      Family History  Problem Relation Age of Onset   Hyperlipidemia Mother    Asthma  Mother    Thyroid disease Mother    Depression Mother    Rheum arthritis Mother    Cancer Father        Pancreatic Cancer   Diabetes Father    Heart attack Brother    Depression Brother    HIV/AIDS Brother      Social History   Socioeconomic History   Marital status: Single    Spouse name: Not on file   Number of children: Not on file   Years of education: Not on file   Highest education level: Not on file  Occupational History   Not on file  Tobacco Use   Smoking status: Former    Types: Cigarettes    Quit date: 2007    Years since quitting: 17.2   Smokeless tobacco: Never  Vaping Use   Vaping Use: Never used  Substance and Sexual Activity   Alcohol use: Yes    Alcohol/week: 2.0 standard drinks of alcohol    Types: 2 Glasses of wine per week    Comment: Drinks wine occasional   Drug use: Never   Sexual activity: Not Currently  Other Topics Concern   Not on file  Social History Narrative   Right Handed    Lives in a one story home - [place]    Social Determinants of Health   Financial Resource Strain: Not on file  Food Insecurity: Not on file  Transportation Needs: Not on file  Physical Activity: Not on file  Stress: Not on file  Social Connections: Not on file  Intimate Partner Violence: Not on file     No Known Allergies   Outpatient Medications Prior to Visit  Medication Sig Dispense Refill   acetaminophen (TYLENOL) 650 MG CR tablet Take 650 mg by mouth every 8 (eight) hours as needed for pain.     albuterol (VENTOLIN HFA) 108 (90 Base) MCG/ACT inhaler Inhale 2 puffs into the lungs every 6 (six) hours as needed for wheezing or shortness of breath. 18 g 3   apixaban (ELIQUIS) 5 MG TABS tablet Take 1 tablet (5 mg total) by mouth 2 (two) times daily. 60 tablet 5   APIXABAN (ELIQUIS) VTE STARTER PACK (10MG  AND 5MG ) Take as directed on package: start with two-5mg  tablets twice daily for 7 days. On day 8, switch to one-5mg  tablet twice daily. 74 tablet 0    clonazePAM (KLONOPIN) 0.5 MG tablet Take 0.5 mg by mouth daily.     cyclobenzaprine (FLEXERIL) 10 MG tablet Take 10-20 mg by mouth daily as needed for muscle spasms.     Difluprednate 0.05 % EMUL Apply 1 drop to eye 4 (four) times daily. Into the right eye.     dorzolamide (TRUSOPT) 2 % ophthalmic solution Place 1 drop into the right eye 3 (three) times daily.     folic acid (FOLVITE) 1 MG tablet Take 1 tablet by mouth  daily.     hydrochlorothiazide (HYDRODIURIL) 50 MG tablet Take 25 mg by mouth in the morning.     ipratropium-albuterol (DUONEB) 0.5-2.5 (3) MG/3ML SOLN Take 3 mLs by nebulization every 6 (six) hours as needed. 1080 mL 3   lisinopril (ZESTRIL) 40 MG tablet Take 40 mg by mouth in the morning.     LUMIGAN 0.01 % SOLN Place 1 drop into both eyes at bedtime.     methotrexate (RHEUMATREX) 2.5 MG tablet Take 25 mg by mouth once a week. Caution:Chemotherapy. Protect from light. 10 tablets     naproxen (NAPROSYN) 500 MG tablet Take 1,000 mg by mouth daily as needed (pain.).     oxyCODONE-acetaminophen (PERCOCET) 10-325 MG tablet Take 1 tablet by mouth 3 (three) times daily.     pantoprazole (PROTONIX) 40 MG tablet Take 40 mg by mouth in the morning.     potassium chloride (KLOR-CON) 10 MEQ tablet Take 20 mEq by mouth in the morning.     sertraline (ZOLOFT) 100 MG tablet Take 200 mg by mouth in the morning.     traZODone (DESYREL) 50 MG tablet Take 50 mg by mouth at bedtime.     verapamil (VERELAN PM) 360 MG 24 hr capsule Take 360 mg by mouth in the morning.     No facility-administered medications prior to visit.    Review of Systems  Constitutional:  Negative for chills, fever, malaise/fatigue and weight loss.  HENT:  Positive for congestion. Negative for sinus pain and sore throat.   Eyes:        +changes in vision  Respiratory:  Positive for cough and shortness of breath. Negative for hemoptysis, sputum production and wheezing.   Cardiovascular:  Negative for chest pain,  palpitations, orthopnea, claudication and leg swelling.  Gastrointestinal:  Negative for abdominal pain, heartburn, nausea and vomiting.  Genitourinary: Negative.   Musculoskeletal:  Positive for back pain and joint pain. Negative for myalgias.  Skin:  Negative for rash.  Neurological:  Negative for weakness.  Endo/Heme/Allergies: Negative.   Psychiatric/Behavioral: Negative.      Objective:   There were no vitals filed for this visit.  Physical Exam Constitutional:      General: She is not in acute distress.    Appearance: She is obese. She is not ill-appearing.  HENT:     Head: Normocephalic and atraumatic.     Nose: Nose normal.     Mouth/Throat:     Mouth: Mucous membranes are moist.  Eyes:     General: No scleral icterus.    Conjunctiva/sclera: Conjunctivae normal.  Cardiovascular:     Rate and Rhythm: Normal rate and regular rhythm.     Pulses: Normal pulses.     Heart sounds: Normal heart sounds. No murmur heard. Pulmonary:     Effort: Pulmonary effort is normal.     Breath sounds: Decreased breath sounds present. No wheezing, rhonchi or rales.  Musculoskeletal:     Right lower leg: No edema.     Left lower leg: No edema.  Skin:    General: Skin is warm and dry.  Neurological:     General: No focal deficit present.     Mental Status: She is alert.  Psychiatric:        Mood and Affect: Mood normal.        Behavior: Behavior normal.        Thought Content: Thought content normal.        Judgment: Judgment normal.  CBC    Component Value Date/Time   WBC 4.7 04/26/2022 0116   RBC 3.37 (L) 04/26/2022 0116   HGB 11.4 (L) 04/26/2022 0116   HCT 34.4 (L) 04/26/2022 0116   PLT 133 (L) 04/26/2022 0116   MCV 102.1 (H) 04/26/2022 0116   MCH 33.8 04/26/2022 0116   MCHC 33.1 04/26/2022 0116   RDW 16.1 (H) 04/26/2022 0116   LYMPHSABS 2.0 04/26/2022 0116   MONOABS 0.5 04/26/2022 0116   EOSABS 0.3 04/26/2022 0116   BASOSABS 0.0 04/26/2022 0116    Chest  imaging: CTA Chest 07/16/21 1. No evidence of pulmonary embolism. 2. Subtle perilymphatic nodular thickening most bilaterally with mild mosaic attenuation. Appearance can be seen in the setting of pulmonary sarcoidosis. A nonspecific viral bronchiolitis also result in this appearance. 3. Redemonstration of multiple enlarged mediastinal and bilateral hilar lymph nodes, likely related to history of sarcoidosis. 4. Mildly dilated main pulmonary trunk, which can be seen in the setting of pulmonary arterial hypertension.  HRCT Chest 10/19/20 1. Examination of the lungs is somewhat limited by body habitus and related photopenia. Within this limitation, there is mild, bandlike bibasilar scarring and or partial atelectasis without specific findings of pulmonary parenchymal sarcoidosis or other fibrotic interstitial lung disease. 2. Numerous prominent mediastinal and hilar lymph nodes, nonspecific although potentially in keeping with nodal sarcoidosis. 3. Lobular air trapping on expiratory phase imaging, suggestive of small airways disease. 4. Enlargement of the main pulmonary artery, as can be seen in pulmonary hypertension.  MRI Cardiac 02/07/2019 - report reviewed from records from [place] Multiple conglomerated lymph nodes within the superior prevascular, right paratracheal and bilateral hilar, precarinal and subcarinal lymph nodes measuring 3.8 x 2.8cm para tracheal nodes. Bilateral multiple subpectoral and axillary lymph nodes measuring up to 1.1 x 0.8cm. New bilateral hypermetabolic diffuse groundglass and subcentimeter nodular opacities.   PFT:    Latest Ref Rng & Units 12/15/2020    3:04 PM  PFT Results  FVC-Pre L 1.83   FVC-Predicted Pre % 50   FVC-Post L 1.68   FVC-Predicted Post % 45   Pre FEV1/FVC % % 79   Post FEV1/FCV % % 83   FEV1-Pre L 1.45   FEV1-Predicted Pre % 49   FEV1-Post L 1.39   DLCO uncorrected ml/min/mmHg 21.12   DLCO UNC% % 81   DLVA Predicted % 128   TLC L  4.68   TLC % Predicted % 76   RV % Predicted % 92   PFT 2022: Mild restrictive defect present  Echo 09/28/20: EF 70-75%. Mild LVH. Grade I diastolic dysfunction. RV systolic function is normal. RV size is normal. LA is mildly dilated.   EKG 09/15/20: Normal sinus rhythm, PR 160, QRS 94, Qtc 493  LHC 04/24/2017 - from [place] records LVEDP mildly elevated LV systolic function is normal  Assessment & Plan:   No diagnosis found.  Discussion: Stormy Connon is a 57 year old woman, former smoker with hypertension, pulmonary hypertension, fibromyalgia, uveitis and sarcoidosis who returns to pulmonary clinic for follow up of sarcoidosis.  She has history of sarcoidosis with confirmed lung biopsy from 2007 at [facility] in [place]. She was previously treated with high dose steroids and then recommended to start methotrexate for involvement of uveitis and polyarthropathy by rheumatology. She did not start this treatment due to concerns of side effects. HRCTChest  10/19/20 shows prominent hilar and mediastinal lymph nodes but no significant parenchymal involvement of sarcoidosis. CTA Chest 07/16/21 shows similar lymph node  findings in the chest and subtle perilymphatic nodular thickening bilaterally of the mid/upper lobes with mild mosaic attenuation.   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 She has been recommended to start methotrexate injections by her rheumatologist and ophthalmologist. I am in agreement with this treatment given the uveitis along with new perilymphatic nodular thickening noted on recent CTA Chest.  She can continue albuterol inhaler as needed. Start fluticasone and ipratropium nasal spray for nasal/sinus congestion.  She is on bipap therapy for her sleep apnea and is using 2L supplemental oxygen.   She is waiting for appointment with the weight management clinic. Unable to be seen by a nutritionist due to her [employer].  Follow-up in 6  months.  Freda Jackson, MD [facility] Office: [phone]   Current Outpatient Medications:    acetaminophen (TYLENOL) 650 MG CR tablet, Take 650 mg by mouth every 8 (eight) hours as needed for pain., Disp: , Rfl:    albuterol (VENTOLIN HFA) 108 (90 Base) MCG/ACT inhaler, Inhale 2 puffs into the lungs every 6 (six) hours as needed for wheezing or shortness of breath., Disp: 18 g, Rfl: 3   apixaban (ELIQUIS) 5 MG TABS tablet, Take 1 tablet (5 mg total) by mouth 2 (two) times daily., Disp: 60 tablet, Rfl: 5   APIXABAN (ELIQUIS) VTE STARTER PACK (10MG  AND 5MG ), Take as directed on package: start with two-5mg  tablets twice daily for 7 days. On day 8, switch to one-5mg  tablet twice daily., Disp: 74 tablet, Rfl: 0   clonazePAM (KLONOPIN) 0.5 MG tablet, Take 0.5 mg by mouth daily., Disp: , Rfl:    cyclobenzaprine (FLEXERIL) 10 MG tablet, Take 10-20 mg by mouth daily as needed for muscle spasms., Disp: , Rfl:    Difluprednate 0.05 % EMUL, Apply 1 drop to eye 4 (four) times daily. Into the right eye., Disp: , Rfl:    dorzolamide (TRUSOPT) 2 % ophthalmic solution, Place 1 drop into the right eye 3 (three) times daily., Disp: , Rfl:    folic acid (FOLVITE) 1 MG tablet, Take 1 tablet by mouth daily., Disp: , Rfl:    hydrochlorothiazide (HYDRODIURIL) 50 MG tablet, Take 25 mg by mouth in the morning., Disp: , Rfl:    ipratropium-albuterol (DUONEB) 0.5-2.5 (3) MG/3ML SOLN, Take 3 mLs by nebulization every 6 (six) hours as needed., Disp: 1080 mL, Rfl: 3   lisinopril (ZESTRIL) 40 MG tablet, Take 40 mg by mouth in the morning., Disp: , Rfl:    LUMIGAN 0.01 % SOLN, Place 1 drop into both eyes at bedtime., Disp: , Rfl:    methotrexate (RHEUMATREX) 2.5 MG tablet, Take 25 mg by mouth once a week. Caution:Chemotherapy. Protect from light. 10 tablets, Disp: , Rfl:    naproxen (NAPROSYN) 500 MG tablet, Take 1,000 mg by mouth daily as needed (pain.)., Disp: , Rfl:    oxyCODONE-acetaminophen  (PERCOCET) 10-325 MG tablet, Take 1 tablet by mouth 3 (three) times daily., Disp: , Rfl:    pantoprazole (PROTONIX) 40 MG tablet, Take 40 mg by mouth in the morning., Disp: , Rfl:    potassium chloride (KLOR-CON) 10 MEQ tablet, Take 20 mEq by mouth in the morning., Disp: , Rfl:    sertraline (ZOLOFT) 100 MG tablet, Take 200 mg by mouth in the morning., Disp: , Rfl:    traZODone (DESYREL) 50 MG tablet, Take 50 mg by mouth at bedtime., Disp: , Rfl:    verapamil (VERELAN PM) 360 MG 24 hr capsule,  Take 360 mg by mouth in the morning., Disp: , Rfl:

## 2022-05-25 ENCOUNTER — Telehealth: Payer: Self-pay | Admitting: Cardiology

## 2022-05-25 NOTE — Telephone Encounter (Signed)
Patient states multiple issues.  She states she has tingling in her right arm and hand off and on due to carpel tunnel, but last night the feeling lasted 30-45 minutes before resolving.  She states  having no feeling in her hand and her "pinky finger was cold".  She stats then last night was exercising/stretching and her right thigh wen numb and was like "a paralyzed feeling:".  She then states her Left arm has had pain in the upper arm to elbow.  She states that she called her PCP who told her to go to Urgent Care to be checked out by vascular.  She is now calling us.  She states "I don;t have time for an appt. I just want that gel and them to see if there is anything in these place" She mentions having a blood clot before in her Left calf, ankle, and lung and wants to make sure there is nothing going on.  I advise I would pass this on as it is different areas of concern, and get the doctors recommendations.

## 2022-05-25 NOTE — Telephone Encounter (Signed)
Pt c/o medication issue:  1. Name of Medication: apixaban (ELIQUIS) 5 MG TABS tablet   2. How are you currently taking this medication (dosage and times per day)? Take 1 tablet (5 mg total) by mouth 2 (two) times daily.   3. Are you having a reaction (difficulty breathing--STAT)? no  4. What is your medication issue? Patient is taking two tablets, she wants to know if she can taken 4 tablets.  She states she having numbness in her right had and tingling feeling. It took about 30-45 get feeling back in her right had. She states back in February she was admitted for a DVT in her leg and she saw Dr. Gardiner Rhyme for the first time back in March.  Patient states she is planning on gong to urgent care.   STAT if patient feels like he/she is going to faint   Are you dizzy now? no  Do you feel faint or have you passed out? No hasn't passed out  Do you have any other symptoms? no  Have you checked your HR and BP (record if available)? 150/90 on Tuesday at the doctor's office.

## 2022-05-26 NOTE — Telephone Encounter (Signed)
Recommend ED evaluation for her neurologic symptoms

## 2022-05-26 NOTE — Telephone Encounter (Signed)
Call to patient. She has not seeked medical attention as yet.  She is advised to go to ED to be assessed.  She states understanding, though she also states she has an ophthalmology appt she needs to go to today.  Again advised go to ED to be seen.

## 2022-05-30 ENCOUNTER — Encounter: Payer: Self-pay | Admitting: Pulmonary Disease

## 2022-05-30 ENCOUNTER — Ambulatory Visit: Payer: Medicare HMO | Admitting: Pulmonary Disease

## 2022-05-30 VITALS — BP 122/80 | HR 77 | Temp 98.4°F | Ht 71.0 in | Wt 337.0 lb

## 2022-05-30 DIAGNOSIS — G4734 Idiopathic sleep related nonobstructive alveolar hypoventilation: Secondary | ICD-10-CM

## 2022-05-30 DIAGNOSIS — D869 Sarcoidosis, unspecified: Secondary | ICD-10-CM

## 2022-05-30 DIAGNOSIS — G4733 Obstructive sleep apnea (adult) (pediatric): Secondary | ICD-10-CM

## 2022-05-30 DIAGNOSIS — I2609 Other pulmonary embolism with acute cor pulmonale: Secondary | ICD-10-CM

## 2022-05-30 DIAGNOSIS — H209 Unspecified iridocyclitis: Secondary | ICD-10-CM

## 2022-05-30 LAB — MAGNESIUM: Magnesium: 1.9 mg/dL (ref 1.5–2.5)

## 2022-05-30 LAB — BASIC METABOLIC PANEL
BUN: 19 mg/dL (ref 6–23)
CO2: 33 mEq/L — ABNORMAL HIGH (ref 19–32)
Calcium: 9.4 mg/dL (ref 8.4–10.5)
Chloride: 100 mEq/L (ref 96–112)
Creatinine, Ser: 1.12 mg/dL (ref 0.40–1.20)
GFR: 55.51 mL/min — ABNORMAL LOW (ref >60.00)
Glucose, Bld: 85 mg/dL (ref 70–99)
Potassium: 3.9 mEq/L (ref 3.5–5.1)
Sodium: 140 mEq/L (ref 135–145)

## 2022-05-30 NOTE — Patient Instructions (Addendum)
Your Bipap usage is great. We will check an overnight oximitry test on bipap with 2L of O2.   Continue eliquis for the blood clots  Continue methotrexate per the eye doctor team  Recommend following up with your neurologist  Follow up in 2 months for pulmonary function tests

## 2022-05-30 NOTE — Progress Notes (Signed)
f  Synopsis: Referred in August 2022 for Sarcoidosis   Subjective:   PATIENT ID: Julie Cruz GENDER: female DOB: 1965-08-22, MRN: [id number]  HPI  Chief Complaint  Patient presents with   Hospitalization Follow-up    H/f respiratory failure, sob, chest tightness. Experiencing dizziness and  imbalanced.  Pt has blood clots in lungs and legs    Julie Cruz is a 57 year old woman, former smoker with hypertension, pulmonary hypertension, fibromyalgia, uveitis and sarcoidosis who returns to pulmonary clinic for follow up of sarcoidosis.  She was recently hospitalized 2/27 to 2/28 with acute hypoxemic respiratory failure and pulmonary emboli/DVT.   She was seen by cardiolgy 05/09/22, note reviewed, with plans for cardiac MRI and PET scans.   She is having episodes of numbness/weakness symptoms of her extremities.   She remains on methotrexate for her uveitis.   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   She has started bipap therapy and has been compliant based on her recent download.   She was seen in the ER 5/17 and 5/20 for dizziness and near syncope episodes. Evaluations were unrevealing and she reports no issues since those visits.   She currently has cold like symptoms with runny nose and nasal congestion.   OV 04/12/21 She has not started her symbicort inhaler as she is concerned about the side effects. She is using albuterol 2 to 4 times per day for shortness of breath, cough or wheezing. She recently had ophthalmology evaluation and they did not note inflammatory involvement related to sarcoidosis. She does have vision loss in her right eye due to retina issues and she is also being treated for glaucoma.   She was contacted by a  nutritionist after last visit but this is not covered by her insurance. She has been referred to the weight loss clinis.  OV 02/24/21 She tested positive for covid 19 on 12/13 and was placed on 12 day prednisone taper on 12/21 for her symptoms of cough and dyspnea. She is much improved at this time. She is using albuterol as needed for her wheezing or dyspnea and using it on a daily basis. She has not started the Symbicort inhaler that was sent in for her after the HRCT Chest was performed showing small airways disease.   PFTs from 10/22 show a restrictive defect with normal diffusion capacity. She is scheduled for split night sleep study 03/15/20. She has ophthalmology visit next week.   She brought in papers from [place]. [facility] where she was prescribed methotrexate for sarcoidosis, uveitis and polyarthropathy. She was seen by ophthalmology and rheumatology.   OV 10/06/20 She reports being diagnosed with sarcoidosis at age 33 via lung biopsy in 2007 at [facility] in [place].  She was initially on prednisone for treatment but then due to side effects has stopped this therapy and has been weary of using methotrexate so she has not required systemic therapy over the last few years.  She denies progressive dyspnea since deciding not to have systemic treatment of her sarcoidosis. She is currently using albuterol twice daily for shortness of breath.  She was previously on Advair but did not like this inhaler.   She has history of obstructive sleep apnea and has a CPAP machine but has not used it in months  as it is one of the machines included in the recent recall.  She has gained 20 to 40 pounds over the last couple of years.  Past Medical History:  Diagnosis Date   Acute medial meniscus tear    Anxiety    Bipolar 1 disorder    Depression    Fibromyalgia    H/O spinal cord injury    T, L ans CSpine   Hypertension    Menopause    Pulmonary hypertension    Sarcoid, cardiac      Family  History  Problem Relation Age of Onset   Hyperlipidemia Mother    Asthma Mother    Thyroid disease Mother    Depression Mother    Rheum arthritis Mother    Cancer Father        Pancreatic Cancer   Diabetes Father    Heart attack Brother    Depression Brother    HIV/AIDS Brother      Social History   Socioeconomic History   Marital status: Single    Spouse name: Not on file   Number of children: Not on file   Years of education: Not on file   Highest education level: Not on file  Occupational History   Not on file  Tobacco Use   Smoking status: Former    Types: Cigarettes    Quit date: 2007    Years since quitting: 17.2   Smokeless tobacco: Never  Vaping Use   Vaping Use: Never used  Substance and Sexual Activity   Alcohol use: Yes    Alcohol/week: 2.0 standard drinks of alcohol    Types: 2 Glasses of wine per week    Comment: Drinks wine occasional   Drug use: Never   Sexual activity: Not Currently  Other Topics Concern   Not on file  Social History Narrative   Right Handed    Lives in a one story home - [place]    Social Determinants of Health   Financial Resource Strain: Not on file  Food Insecurity: Not on file  Transportation Needs: Not on file  Physical Activity: Not on file  Stress: Not on file  Social Connections: Not on file  Intimate Partner Violence: Not on file     No Known Allergies   Outpatient Medications Prior to Visit  Medication Sig Dispense Refill   acetaminophen (TYLENOL) 650 MG CR tablet Take 650 mg by mouth every 8 (eight) hours as needed for pain.     apixaban (ELIQUIS) 5 MG TABS tablet Take 1 tablet (5 mg total) by mouth 2 (two) times daily. 60 tablet 5   APIXABAN (ELIQUIS) VTE STARTER PACK (  AND ) Take as directed on package: start with two-5mg  tablets twice daily for 7 days. On day 8, switch to one-5mg  tablet twice daily. 74 tablet 0   clonazePAM (KLONOPIN) 0.5 MG tablet Take 0.5 mg by mouth daily.      cyclobenzaprine (FLEXERIL) 10 MG tablet Take 10-20 mg by mouth daily as needed for muscle spasms.     dorzolamide (TRUSOPT) 2 % ophthalmic solution Place 1 drop into the right eye 3 (three) times daily.     folic acid (FOLVITE) 1 MG tablet Take 1 tablet by mouth daily.     hydrochlorothiazide (HYDRODIURIL) 50 MG tablet Take 25 mg by mouth in the morning.     ipratropium-albuterol (DUONEB) 0.5-2.5 (3) MG/3ML SOLN Take 3 mLs by nebulization every 6 (six) hours as needed. 1080 mL 3  lisinopril (ZESTRIL) 40 MG tablet Take 40 mg by mouth in the morning.     LUMIGAN 0.01 % SOLN Place 1 drop into both eyes at bedtime.     methotrexate (RHEUMATREX) 2.5 MG tablet Take 25 mg by mouth once a week. Caution:Chemotherapy. Protect from light. 10 tablets     oxyCODONE-acetaminophen (PERCOCET) 10-325 MG tablet Take 1 tablet by mouth 3 (three) times daily.     pantoprazole (PROTONIX) 40 MG tablet Take 40 mg by mouth in the morning.     potassium chloride (KLOR-CON) 10 MEQ tablet Take 20 mEq by mouth in the morning.     sertraline (ZOLOFT) 100 MG tablet Take 200 mg by mouth in the morning.     traZODone (DESYREL) 50 MG tablet Take 50 mg by mouth at bedtime.     verapamil (VERELAN PM) 360 MG 24 hr capsule Take 360 mg by mouth in the morning.     albuterol (VENTOLIN HFA) 108 (90 Base) MCG/ACT inhaler Inhale 2 puffs into the lungs every 6 (six) hours as needed for wheezing or shortness of breath. 18 g 3   Difluprednate 0.05 % EMUL Apply 1 drop to eye 4 (four) times daily. Into the right eye. (Patient not taking: Reported on 05/30/2022)     naproxen (NAPROSYN) 500 MG tablet Take 1,000 mg by mouth daily as needed (pain.). (Patient not taking: Reported on 05/30/2022)     No facility-administered medications prior to visit.    Review of Systems  Constitutional:  Negative for chills, fever, malaise/fatigue and weight loss.  HENT:  Positive for congestion. Negative for sinus pain and sore throat.   Eyes:        +changes  in vision  Respiratory:  Positive for cough and shortness of breath. Negative for hemoptysis, sputum production and wheezing.   Cardiovascular:  Negative for chest pain, palpitations, orthopnea, claudication and leg swelling.  Gastrointestinal:  Negative for abdominal pain, heartburn, nausea and vomiting.  Genitourinary: Negative.   Musculoskeletal:  Positive for back pain and joint pain. Negative for myalgias.  Skin:  Negative for rash.  Neurological:  Negative for weakness.  Endo/Heme/Allergies: Negative.   Psychiatric/Behavioral: Negative.      Objective:   Vitals:   05/30/22 1042  BP: 122/80  Pulse: 77  Temp: 98.4 F (36.9 C)  TempSrc: Oral  SpO2: 100%  Weight: (!) 337 lb (152.9 kg)  Height: 5\' 11"  (1.803 m)   Physical Exam Constitutional:      General: She is not in acute distress.    Appearance: She is obese. She is not ill-appearing.  HENT:     Head: Normocephalic and atraumatic.     Nose: Nose normal.     Mouth/Throat:     Mouth: Mucous membranes are moist.  Eyes:     General: No scleral icterus.    Conjunctiva/sclera: Conjunctivae normal.  Cardiovascular:     Rate and Rhythm: Normal rate and regular rhythm.     Pulses: Normal pulses.     Heart sounds: Normal heart sounds. No murmur heard. Pulmonary:     Effort: Pulmonary effort is normal.     Breath sounds: Decreased breath sounds present. No wheezing, rhonchi or rales.  Musculoskeletal:     Right lower leg: No edema.     Left lower leg: No edema.  Skin:    General: Skin is warm and dry.  Neurological:     General: No focal deficit present.     Mental Status: She is alert.  Psychiatric:        Mood and Affect: Mood normal.        Behavior: Behavior normal.        Thought Content: Thought content normal.        Judgment: Judgment normal.     CBC    Component Value Date/Time   WBC 4.7 04/26/2022 0116   RBC 3.37 (L) 04/26/2022 0116   HGB 11.4 (L) 04/26/2022 0116   HCT 34.4 (L) 04/26/2022 0116    PLT 133 (L) 04/26/2022 0116   MCV 102.1 (H) 04/26/2022 0116   MCH 33.8 04/26/2022 0116   MCHC 33.1 04/26/2022 0116   RDW 16.1 (H) 04/26/2022 0116   LYMPHSABS 2.0 04/26/2022 0116   MONOABS 0.5 04/26/2022 0116   EOSABS 0.3 04/26/2022 0116   BASOSABS 0.0 04/26/2022 0116    Chest imaging: CTA Chest 04/25/22 1. Acute pulmonary embolus with moderate embolic burden and CT evidence of right heart strain (RV/LV equals 1.47). 2. Minimal scattered nodular infiltrates within the upper lobes bilaterally which may be infectious or inflammatory in the acute setting.  CTA Chest 07/16/21 1. No evidence of pulmonary embolism. 2. Subtle perilymphatic nodular thickening most bilaterally with mild mosaic attenuation. Appearance can be seen in the setting of pulmonary sarcoidosis. A nonspecific viral bronchiolitis also result in this appearance. 3. Redemonstration of multiple enlarged mediastinal and bilateral hilar lymph nodes, likely related to history of sarcoidosis. 4. Mildly dilated main pulmonary trunk, which can be seen in the setting of pulmonary arterial hypertension.  HRCT Chest 10/19/20 1. Examination of the lungs is somewhat limited by body habitus and related photopenia. Within this limitation, there is mild, bandlike bibasilar scarring and or partial atelectasis without specific findings of pulmonary parenchymal sarcoidosis or other fibrotic interstitial lung disease. 2. Numerous prominent mediastinal and hilar lymph nodes, nonspecific although potentially in keeping with nodal sarcoidosis. 3. Lobular air trapping on expiratory phase imaging, suggestive of small airways disease. 4. Enlargement of the main pulmonary artery, as can be seen in pulmonary hypertension.  MRI Cardiac 02/07/2019 - report reviewed from records from [place] Multiple conglomerated lymph nodes within the superior prevascular, right paratracheal and bilateral hilar, precarinal and subcarinal lymph nodes measuring 3.8  x 2.8cm para tracheal nodes. Bilateral multiple subpectoral and axillary lymph nodes measuring up to 1.1 x 0.8cm. New bilateral hypermetabolic diffuse groundglass and subcentimeter nodular opacities.   PFT:    Latest Ref Rng & Units 12/15/2020    3:04 PM  PFT Results  FVC-Pre L 1.83   FVC-Predicted Pre % 50   FVC-Post L 1.68   FVC-Predicted Post % 45   Pre FEV1/FVC % % 79   Post FEV1/FCV % % 83   FEV1-Pre L 1.45   FEV1-Predicted Pre % 49   FEV1-Post L 1.39   DLCO uncorrected ml/min/mmHg 21.12   DLCO UNC% % 81   DLVA Predicted % 128   TLC L 4.68   TLC % Predicted % 76   RV % Predicted % 92   PFT 2022: Mild restrictive defect present  Echo 09/28/20: EF 70-75%. Mild LVH. Grade I diastolic dysfunction. RV systolic function is normal. RV size is normal. LA is mildly dilated.   EKG 09/15/20: Normal sinus rhythm, PR 160, QRS 94, Qtc 493  LHC 04/24/2017 - from [place] records LVEDP mildly elevated LV systolic function is normal  Assessment & Plan:   Sarcoidosis  OSA (obstructive sleep apnea)  Nocturnal hypoxemia  Morbid obesity  Uveitis  Other pulmonary embolism with  acute cor pulmonale, unspecified chronicity  Discussion: Julie Cruz is a 57 year old woman, former smoker with hypertension, pulmonary hypertension, fibromyalgia, uveitis and sarcoidosis who returns to pulmonary clinic for follow up of sarcoidosis and DVT/PE.  She has history of sarcoidosis with confirmed lung biopsy from 2007 at [facility] in [place]. She was previously treated with high dose steroids and then recommended to start methotrexate for involvement of uveitis and polyarthropathy by rheumatology. She did not start this treatment due to concerns of side effects. HRCTChest  10/19/20 shows prominent hilar and mediastinal lymph nodes but no significant parenchymal involvement of sarcoidosis. CTA Chest 07/16/21 shows similar lymph node findings in the chest and subtle perilymphatic nodular  thickening bilaterally of the mid/upper lobes with mild mosaic attenuation.   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   Recent CTA Chest does not show any changes in her mediastinal lymph node and parenchymal lung findings.   She is to continue eliquis for DVT/PE.  She can continue albuterol inhaler as needed.   She is on bipap therapy for her sleep apnea and is using 2L supplemental oxygen. We will check overnight oximetry test on the bipap and 2L.   Follow up with neurology regarding numbness/tinging episodes in extremities.   Follow-up in 2 months with PFTs.  Julie Comas, MD [facility] Office: [phone]   Current Outpatient Medications:    acetaminophen (TYLENOL) 650 MG CR tablet, Take 650 mg by mouth every 8 (eight) hours as needed for pain., Disp: , Rfl:    apixaban (ELIQUIS) 5 MG TABS tablet, Take 1 tablet (5 mg total) by mouth 2 (two) times daily., Disp: 60 tablet, Rfl: 5   APIXABAN (ELIQUIS) VTE STARTER PACK (10MG  AND 5MG ), Take as directed on package: start with two-5mg  tablets twice daily for 7 days. On day 8, switch to one-5mg  tablet twice daily., Disp: 74 tablet, Rfl: 0   clonazePAM (KLONOPIN) 0.5 MG tablet, Take 0.5 mg by mouth daily., Disp: , Rfl:    cyclobenzaprine (FLEXERIL) 10 MG tablet, Take 10-20 mg by mouth daily as needed for muscle spasms., Disp: , Rfl:    dorzolamide (TRUSOPT) 2 % ophthalmic solution, Place 1 drop into the right eye 3 (three) times daily., Disp: , Rfl:    folic acid (FOLVITE) 1 MG tablet, Take 1 tablet by mouth daily., Disp: , Rfl:    hydrochlorothiazide (HYDRODIURIL) 50 MG tablet, Take 25 mg by mouth in the morning., Disp: , Rfl:    ipratropium-albuterol (DUONEB) 0.5-2.5 (3) MG/3ML SOLN, Take 3 mLs by nebulization every 6 (six) hours as needed., Disp: 1080 mL, Rfl: 3   lisinopril (ZESTRIL) 40 MG tablet, Take 40 mg by mouth in the morning.,  Disp: , Rfl:    LUMIGAN 0.01 % SOLN, Place 1 drop into both eyes at bedtime., Disp: , Rfl:    methotrexate (RHEUMATREX) 2.5 MG tablet, Take 25 mg by mouth once a week. Caution:Chemotherapy. Protect from light. 10 tablets, Disp: , Rfl:    oxyCODONE-acetaminophen (PERCOCET) 10-325 MG tablet, Take 1 tablet by mouth 3 (three) times daily., Disp: , Rfl:    pantoprazole (PROTONIX) 40 MG tablet, Take 40 mg by mouth in the morning., Disp: , Rfl:    potassium chloride (KLOR-CON) 10 MEQ tablet, Take 20 mEq by mouth in the morning., Disp: , Rfl:    sertraline (ZOLOFT) 100 MG tablet, Take 200 mg by mouth in the morning.,  Disp: , Rfl:    traZODone (DESYREL) 50 MG tablet, Take 50 mg by mouth at bedtime., Disp: , Rfl:    verapamil (VERELAN PM) 360 MG 24 hr capsule, Take 360 mg by mouth in the morning., Disp: , Rfl:    albuterol (VENTOLIN HFA) 108 (90 Base) MCG/ACT inhaler, Inhale 2 puffs into the lungs every 6 (six) hours as needed for wheezing or shortness of breath., Disp: 18 g, Rfl: 3   Difluprednate 0.05 % EMUL, Apply 1 drop to eye 4 (four) times daily. Into the right eye. (Patient not taking: Reported on 05/30/2022), Disp: , Rfl:    naproxen (NAPROSYN) 500 MG tablet, Take 1,000 mg by mouth daily as needed (pain.). (Patient not taking: Reported on 05/30/2022), Disp: , Rfl:

## 2022-06-02 ENCOUNTER — Encounter: Payer: Self-pay | Admitting: *Deleted

## 2022-06-07 ENCOUNTER — Telehealth: Payer: Self-pay | Admitting: Cardiology

## 2022-06-07 NOTE — Telephone Encounter (Signed)
  Pt is calling about her lab result. She would like to speak with a nurse to discuss result

## 2022-06-07 NOTE — Telephone Encounter (Signed)
Returned call to pt she states that while she was in the hospital she had a BNP done and would like Dr Bjorn Pippin to review, she would like a call back tonight. Informed pt that Dr Bjorn Pippin was not in the office and it is now after 5pm. I will forward message to Dr Bjorn Pippin and we will call with Dr Campbell Lerner comments in a timely manner when we receive his message back. Verbalized understanding.

## 2022-06-08 ENCOUNTER — Telehealth: Payer: Self-pay | Admitting: Pulmonary Disease

## 2022-06-08 NOTE — Telephone Encounter (Signed)
Pt called the office about the rubs that she uses to help with her pain. Pt said that she usually uses icy hot to help with the pain but said when she was discharged, there was a note that stated for pt to stop using rubs such as icy hot.   Pt said she has been using this for years which usually helps and wants to get the okay to still be able to use this.  Dr. Francine Graven, please advise on this.

## 2022-06-08 NOTE — Telephone Encounter (Signed)
BNP was mildly elevated when she was in the hospital, which can suggest some extra fluid.  When I saw her in clinic she did not appear volume overloaded.  We will continue to monitor

## 2022-06-08 NOTE — Telephone Encounter (Signed)
Called and spoke with pt letting her know the info per Dr. Dewald and she verbalized understanding. Nothing further needed. 

## 2022-06-08 NOTE — Telephone Encounter (Signed)
It is ok to use Icy hot as needed.  Thanks, Cletis Athens

## 2022-06-09 NOTE — Telephone Encounter (Signed)
Patient aware and verbalized understanding. °

## 2022-06-11 ENCOUNTER — Encounter: Payer: Self-pay | Admitting: Pulmonary Disease

## 2022-06-12 ENCOUNTER — Ambulatory Visit: Payer: Medicare HMO | Admitting: Interventional Cardiology

## 2022-06-13 ENCOUNTER — Ambulatory Visit: Payer: Medicare HMO | Admitting: Neurology

## 2022-06-16 ENCOUNTER — Telehealth: Payer: Self-pay | Admitting: Pulmonary Disease

## 2022-06-16 ENCOUNTER — Telehealth (HOSPITAL_COMMUNITY): Payer: Self-pay | Admitting: *Deleted

## 2022-06-16 NOTE — Telephone Encounter (Signed)
Advised pt to call Dr. Tomasa Blase office for Eliquis prescription since they have been prescribing it. She verbalized understanding. NFN

## 2022-06-16 NOTE — Telephone Encounter (Signed)
Reaching out to patient to offer assistance regarding upcoming cardiac imaging study; pt verbalizes understanding of appt date/time, parking situation and where to check in, pre-test NPO status and verified current allergies; name and call back number provided for further questions should they arise  Larey Brick RN Navigator Cardiac Imaging [facility] [phone] office [phone] cell  Reviewed diet prep with patient. She verbalizes understanding.

## 2022-06-16 NOTE — Telephone Encounter (Signed)
Pharmacy called to request a refill for Eliquis.  Stated that they sent a request on 4/12 and has not heard anything yet.  Please advise and call to confirm.  CB# [phone]

## 2022-06-16 NOTE — Telephone Encounter (Signed)
Attempted to call patient regarding upcoming cardiac PET appointment. Left message on voicemail with name and callback number  Xayvion Shirah RN Navigator Cardiac Imaging [facility] [phone] Office [phone] Cell  

## 2022-06-20 ENCOUNTER — Emergency Department (HOSPITAL_COMMUNITY): Payer: Medicare HMO

## 2022-06-20 ENCOUNTER — Encounter (HOSPITAL_COMMUNITY): Payer: Self-pay

## 2022-06-20 ENCOUNTER — Emergency Department (HOSPITAL_BASED_OUTPATIENT_CLINIC_OR_DEPARTMENT_OTHER)
Admission: RE | Admit: 2022-06-20 | Discharge: 2022-06-20 | Disposition: A | Discharge status: Home / Self Care | Payer: Medicare HMO | Source: Ambulatory Visit | Attending: Cardiology | Admitting: Cardiology

## 2022-06-20 ENCOUNTER — Emergency Department (HOSPITAL_COMMUNITY)
Admission: EM | Admit: 2022-06-20 | Discharge: 2022-06-20 | Disposition: A | Discharge status: Home / Self Care | Payer: Medicare HMO | Attending: Emergency Medicine | Admitting: Emergency Medicine

## 2022-06-20 ENCOUNTER — Other Ambulatory Visit: Payer: Self-pay

## 2022-06-20 ENCOUNTER — Telehealth: Payer: Self-pay | Admitting: Pulmonary Disease

## 2022-06-20 DIAGNOSIS — D869 Sarcoidosis, unspecified: Secondary | ICD-10-CM | POA: Insufficient documentation

## 2022-06-20 DIAGNOSIS — I2609 Other pulmonary embolism with acute cor pulmonale: Secondary | ICD-10-CM

## 2022-06-20 DIAGNOSIS — R079 Chest pain, unspecified: Secondary | ICD-10-CM | POA: Diagnosis present

## 2022-06-20 DIAGNOSIS — Z1152 Encounter for screening for COVID-19: Secondary | ICD-10-CM | POA: Insufficient documentation

## 2022-06-20 DIAGNOSIS — R42 Dizziness and giddiness: Secondary | ICD-10-CM | POA: Diagnosis not present

## 2022-06-20 DIAGNOSIS — E876 Hypokalemia: Secondary | ICD-10-CM | POA: Insufficient documentation

## 2022-06-20 LAB — TROPONIN I (HIGH SENSITIVITY): Troponin I (High Sensitivity): 4 ng/L (ref <18)

## 2022-06-20 LAB — BASIC METABOLIC PANEL
Anion gap: 10 (ref 5–15)
BUN: 16 mg/dL (ref 6–20)
CO2: 27 mmol/L (ref 22–32)
Calcium: 8.7 mg/dL — ABNORMAL LOW (ref 8.9–10.3)
Chloride: 99 mmol/L (ref 98–111)
Creatinine, Ser: 1.06 mg/dL — ABNORMAL HIGH (ref 0.44–1.00)
GFR, Estimated: >60 mL/min (ref >60)
Glucose, Bld: 95 mg/dL (ref 70–99)
Potassium: 3 mmol/L — ABNORMAL LOW (ref 3.5–5.1)
Sodium: 136 mmol/L (ref 135–145)

## 2022-06-20 LAB — CBC WITH DIFFERENTIAL/PLATELET
Abs Immature Granulocytes: 0.01 10*3/uL (ref 0.00–0.07)
Basophils Absolute: 0 10*3/uL (ref 0.0–0.1)
Basophils Relative: 1 %
Eosinophils Absolute: 0.2 10*3/uL (ref 0.0–0.5)
Eosinophils Relative: 5 %
HCT: 36.2 % (ref 36.0–46.0)
Hemoglobin: 11.8 g/dL — ABNORMAL LOW (ref 12.0–15.0)
Immature Granulocytes: 0 %
Lymphocytes Relative: 43 %
Lymphs Abs: 1.8 10*3/uL (ref 0.7–4.0)
MCH: 32.6 pg (ref 26.0–34.0)
MCHC: 32.6 g/dL (ref 30.0–36.0)
MCV: 100 fL (ref 80.0–100.0)
Monocytes Absolute: 0.5 10*3/uL (ref 0.1–1.0)
Monocytes Relative: 11 %
Neutro Abs: 1.7 10*3/uL (ref 1.7–7.7)
Neutrophils Relative %: 40 %
Platelets: 148 10*3/uL — ABNORMAL LOW (ref 150–400)
RBC: 3.62 MIL/uL — ABNORMAL LOW (ref 3.87–5.11)
RDW: 15 % (ref 11.5–15.5)
WBC: 4.2 10*3/uL (ref 4.0–10.5)
nRBC: 0 % (ref 0.0–0.2)

## 2022-06-20 LAB — NM PET CT MYOCARDIAL SARCOIDOSIS
LV dias vol: 86 mL (ref 46–106)
LV sys vol: 34 mL
Nuc Stress EF: 60 %
Rest Nuclear Isotope Dose: 30 mCi

## 2022-06-20 LAB — SARS CORONAVIRUS 2 BY RT PCR: SARS Coronavirus 2 by RT PCR: NEGATIVE

## 2022-06-20 MED ORDER — POTASSIUM CHLORIDE 20 MEQ PO PACK
40.0000 meq | PACK | Freq: Once | ORAL | Status: AC
  Administered 2022-06-20: 40 meq via ORAL
  Filled 2022-06-20: qty 2

## 2022-06-20 MED ORDER — RUBIDIUM RB82 GENERATOR (RUBYFILL)
30.0000 | PACK | Freq: Once | INTRAVENOUS | Status: AC
  Administered 2022-06-20: 30 via INTRAVENOUS

## 2022-06-20 MED ORDER — POTASSIUM CHLORIDE CRYS ER 20 MEQ PO TBCR: 20.0000 meq | EXTENDED_RELEASE_TABLET | Freq: Two times a day (BID) | ORAL | 0 refills | Status: DC

## 2022-06-20 MED ORDER — ACETAMINOPHEN 500 MG PO TABS
1000.0000 mg | ORAL_TABLET | Freq: Once | ORAL | Status: AC
  Administered 2022-06-20: 1000 mg via ORAL
  Filled 2022-06-20: qty 2

## 2022-06-20 MED ORDER — FLUDEOXYGLUCOSE F - 18 (FDG) INJECTION
9.0000 | Freq: Once | INTRAVENOUS | Status: AC | PRN
  Administered 2022-06-20: 9 via INTRAVENOUS

## 2022-06-20 MED ORDER — LACTATED RINGERS IV BOLUS
1000.0000 mL | Freq: Once | INTRAVENOUS | Status: AC
  Administered 2022-06-20: 1000 mL via INTRAVENOUS

## 2022-06-20 NOTE — ED Provider Notes (Signed)
[place] EMERGENCY DEPARTMENT AT [facility] Provider Note   CSN: [id number] Arrival date & time: 06/20/22  4098     History Chief Complaint  Patient presents with   Chest Pain    HPI Julie Cruz is a 57 y.o. female presenting for multiple complaints. 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  States that she came down the ED for evaluation afterwards since she was already on campus. Denies fevers/chills/nausea vomitting but endorses cough and malaise.  Patient's recorded medical, surgical, social, medication list and allergies were reviewed in the Snapshot window as part of the initial history.   Review of Systems   Review of Systems  Constitutional:  Positive for fatigue. Negative for chills and fever.  HENT:  Negative for ear pain and sore throat.   Eyes:  Negative for pain and visual disturbance.  Respiratory:  Positive for cough. Negative for shortness of breath.   Cardiovascular:  Negative for chest pain and palpitations.  Gastrointestinal:  Negative for abdominal pain and vomiting.  Genitourinary:  Negative for dysuria and hematuria.  Musculoskeletal:  Negative for arthralgias and back pain.  Skin:  Negative for color change and rash.  Neurological:  Positive for light-headedness. Negative for seizures and syncope.  All other systems reviewed and are negative.   Physical Exam Updated Vital Signs BP 100/70 (BP Location: Left Arm)   Pulse 89   Temp 98.4 F (36.9 C) (Oral)   Resp 18   SpO2 100%  Physical Exam Vitals and nursing note reviewed.  Constitutional:      General: She is not in acute distress.    Appearance: She is well-developed.  HENT:     Head: Normocephalic and atraumatic.  Eyes:     Conjunctiva/sclera: Conjunctivae normal.  Cardiovascular:     Rate and Rhythm: Normal rate and regular rhythm.     Heart sounds: No murmur  heard. Pulmonary:     Effort: Pulmonary effort is normal. No respiratory distress.     Breath sounds: Normal breath sounds.  Abdominal:     General: There is no distension.     Palpations: Abdomen is soft.     Tenderness: There is no abdominal tenderness. There is no right CVA tenderness or left CVA tenderness.  Musculoskeletal:        General: No swelling or tenderness. Normal range of motion.     Cervical back: Neck supple.  Skin:    General: Skin is warm and dry.  Neurological:     General: No focal deficit present.     Mental Status: She is alert and oriented to person, place, and time. Mental status is at baseline.     Cranial Nerves: No cranial nerve deficit.      ED Course/ Medical Decision Making/ A&P    Procedures Procedures   Medications Ordered in ED Medications  lactated ringers bolus 1,000 mL (0 mLs Intravenous Stopped 06/20/22 1124)  acetaminophen (TYLENOL) tablet 1,000 mg (1,000 mg Oral Given 06/20/22 1006)  potassium chloride (KLOR-CON) packet 40 mEq (40 mEq Oral Given 06/20/22 1149)    Medical Decision Making:    Julie Cruz is a 57 y.o. female who presented to the ED today with multiple complaints detailed above.     Additional history discussed with patient's family/caregivers.  Patient's presentation is complicated by their history of multiple comorbid medical problems.  Patient placed on continuous vitals  and telemetry monitoring while in ED which was reviewed periodically.   Complete initial physical exam performed, notably the patient  was manically stable in no acute distress..      Reviewed and confirmed nursing documentation for past medical history, family history, social history.    Initial Assessment:   With the patient's presentation of generalized malaise, fatigue, shortness of breath, cough, lightheadedness, most likely diagnosis is nonspecific etiology. Other diagnoses were considered including (but not limited to) critical anemia,  electrolyte abnormality, pneumonia, ACS, COVID infection. These are considered less likely due to history of present illness and physical exam findings.   This is most consistent with an acute life/limb threatening illness complicated by underlying chronic conditions.  Initial Plan:  Viral testing Screening labs including CBC and Metabolic panel to evaluate for infectious or metabolic etiology of disease.  CXR to evaluate for structural/infectious intrathoracic pathology.  Objective evaluation as below reviewed with plan for close reassessment  Initial Study Results:   Laboratory  All laboratory results reviewed without evidence of clinically relevant pathology.   Exceptions include: Hypokalemia 3.0  EKG EKG was reviewed independently. Rate, rhythm, axis, intervals all examined and without medically relevant abnormality. ST segments without concerns for elevations.    Radiology  All images reviewed independently. Agree with radiology report at this time.   NM PET CT MYOCARDIAL SARCOIDOSIS  Result Date: 06/20/2022 CLINICAL DATA:  This over-read does not include interpretation of cardiac or coronary anatomy or pathology. The Cardiac PET CT interpretation by the cardiologist is attached. COMPARISON:  CT chest pulmonary angiogram, 04/25/2022, CT chest, 10/19/2020 FINDINGS: Cardiovascular: No significant vascular findings. Normal heart size. No pericardial effusion. Mediastinum/Nodes: No enlarged mediastinal, hilar, or axillary lymph nodes. Thyroid gland, trachea, and esophagus demonstrate no significant findings. Lungs/Pleura: Irregular scarring or atelectasis, predominantly seen in the lung bases, assessment very limited by free breathing technique, photopenia, and body habitus. No pleural effusion or pneumothorax. Upper Abdomen: No acute abnormality. Musculoskeletal: No chest wall abnormality. No acute osseous findings. IMPRESSION: Irregular scarring or atelectasis, predominantly seen in the lung  bases, assessment very limited by free breathing technique, photopenia, and body habitus on attenuation correction CT portion of PET exam. 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 No acute findings. Electronically Signed   By: Jearld Lesch M.D.   On: 06/20/2022 11:33   DG Chest Portable 1 View  Result Date: 06/20/2022 CLINICAL DATA:  Cough. EXAM: PORTABLE CHEST 1 VIEW COMPARISON:  April 24, 2022. FINDINGS: The heart size and mediastinal contours are within normal limits. Both lungs are clear. The visualized skeletal structures are unremarkable. IMPRESSION: No active disease. Electronically Signed   By: Lupita Raider M.D.   On: 06/20/2022 10:12       Final Assessment and Plan:   Objective findings with no focal pathology.  Potassium is chronically low per patient.  May have some comorbid hypomagnesemia that patient needs to follow-up with repeat levels with PCP but otherwise no acute indication for intervention in the emergency room at this time.  No other critical abnormalities identified.  Given otherwise resolution of symptoms on reassessment, possibly some symptoms of dehydration that caused patient symptoms today as she had been n.p.o. prior to her PET scan.  She now feels better after IV p.o. fluid. Disposition:  I have considered need for hospitalization, however, considering all of the above, I believe this patient is stable for  discharge at this time.  Patient/family educated about specific return precautions for given chief complaint and symptoms.  Patient/family educated about follow-up with PCP.     Patient/family expressed understanding of return precautions and need for follow-up. Patient spoken to regarding all imaging and laboratory results and appropriate follow up for these results. All education provided in verbal form with additional information in  written form. Time was allowed for answering of patient questions. Patient discharged.    Emergency Department Medication Summary:   Medications  lactated ringers bolus 1,000 mL (0 mLs Intravenous Stopped 06/20/22 1124)  acetaminophen (TYLENOL) tablet 1,000 mg (1,000 mg Oral Given 06/20/22 1006)  potassium chloride (KLOR-CON) packet 40 mEq (40 mEq Oral Given 06/20/22 1149)    Clinical Impression:  1. Lightheaded   2. Hypokalemia      Data Unavailable   Final Clinical Impression(s) / ED Diagnoses Final diagnoses:  Lightheaded  Hypokalemia    Rx / DC Orders ED Discharge Orders          Ordered    potassium chloride SA (KLOR-CON M) 20 MEQ tablet  2 times daily        06/20/22 1127              Glyn Ade, MD 06/20/22 1234

## 2022-06-20 NOTE — ED Triage Notes (Signed)
Pt coming from nuclear medicine for a test. Pt told staff she wanted to go to ED after test due to having some chest pain and dizziness. Pt states she has been a cough for the past three day that is productive. Also c/o leg pain in her left leg where she has a DVT.

## 2022-06-20 NOTE — Telephone Encounter (Signed)
Patient would like to know if she can fly or go by train. States having pains in her calf. Patient phone is [phone].

## 2022-06-21 MED ORDER — APIXABAN 5 MG PO TABS: 5.0000 mg | ORAL_TABLET | Freq: Two times a day (BID) | ORAL | 3 refills | Status: DC

## 2022-06-21 NOTE — Telephone Encounter (Signed)
As long as patient has been taking her eliquis twice daily and not missing doses, she should not be forming blood clots.   She is ok to travel to see her mother.   Ok to order right lower extremity US to ensure there is no clot.  Thanks, JD

## 2022-06-21 NOTE — Telephone Encounter (Signed)
Pt called the office back. I let her know the info per Dr. Francine Graven and she verbalized understanding. Pt said she has been having pain in both of her legs so instead of stating one leg in particular for the doppler study, did go ahead and make this be for both legs to be checked to ensure no clots in either legs. Nothing further needed.

## 2022-06-21 NOTE — Telephone Encounter (Signed)
Called patient but she did not answer. Left message for patient to call back.  

## 2022-06-21 NOTE — Telephone Encounter (Signed)
Called and spoke with patient. She stated that she had a PET scan done yesterday at [facility] and decided to go to the ED afterwards due to her dizziness. The dizziness has since gone away but she is now experiencing increasing pain in her left ankle and calf. She stated this has been going on for the past few months. She described the pain as a dull, achy pain that sometimes make walking difficult. She has also noticed some swelling in the entire leg and foot. Denied any recent injuries. Confirmed she is still taking Eliquis  twice daily.  She mentioned this yesterday while at the ED but they did not address it. She wants to know if Dr. Francine Graven would be willing to order a doppler for her. Her last one was done back in February 2024.   While on the phone, she mentioned that her mother is currently in the hospital in [place]. She will be travelling via [employer] to visit her. The ride will be at least 10-12hrs with multiple stops. She will also be allowed to get and move around the train. She wanted to know if Dr. Francine Graven would recommend with her proceeding with this trip.   Dr. Francine Graven, can you please advise? Thanks!

## 2022-06-23 ENCOUNTER — Telehealth (HOSPITAL_COMMUNITY): Payer: Self-pay | Admitting: Emergency Medicine

## 2022-06-23 ENCOUNTER — Telehealth (HOSPITAL_COMMUNITY): Payer: Self-pay | Admitting: *Deleted

## 2022-06-23 NOTE — Telephone Encounter (Signed)
Patient returning call about her upcoming cardiac imaging study; pt verbalizes understanding of appt date/time, parking situation and where to check in, and verified current allergies; name and call back number provided for further questions should they arise  Larey Brick RN Navigator Cardiac Imaging [facility] [phone] office [phone] cell  Patient denies any issues with an MRI.

## 2022-06-23 NOTE — Telephone Encounter (Signed)
Attempted to call patient regarding upcoming cardiac MR appointment. Left message on voicemail with name and callback number Naziah Weckerly RN Navigator Cardiac Imaging [facility] [phone] Office [phone] Cell  

## 2022-06-26 ENCOUNTER — Ambulatory Visit (HOSPITAL_COMMUNITY)
Admission: RE | Admit: 2022-06-26 | Discharge: 2022-06-26 | Disposition: A | Discharge status: Home / Self Care | Payer: Medicare HMO | Source: Ambulatory Visit | Attending: Cardiology | Admitting: Cardiology

## 2022-06-26 ENCOUNTER — Encounter (HOSPITAL_COMMUNITY): Admission: RE | Admit: 2022-06-26 | Payer: Medicare HMO | Source: Ambulatory Visit

## 2022-06-26 ENCOUNTER — Other Ambulatory Visit: Payer: Self-pay | Admitting: Cardiology

## 2022-06-26 DIAGNOSIS — D869 Sarcoidosis, unspecified: Secondary | ICD-10-CM

## 2022-06-26 MED ORDER — GADOBUTROL 1 MMOL/ML IV SOLN
10.0000 mL | Freq: Once | INTRAVENOUS | Status: AC | PRN
  Administered 2022-06-26: 10 mL via INTRAVENOUS

## 2022-07-04 ENCOUNTER — Encounter (HOSPITAL_COMMUNITY): Payer: Medicare HMO

## 2022-07-04 ENCOUNTER — Ambulatory Visit (HOSPITAL_COMMUNITY)
Admission: RE | Admit: 2022-07-04 | Discharge: 2022-07-04 | Disposition: A | Discharge status: Home / Self Care | Payer: Medicare HMO | Source: Ambulatory Visit | Attending: Pulmonary Disease | Admitting: Pulmonary Disease

## 2022-07-04 DIAGNOSIS — I2609 Other pulmonary embolism with acute cor pulmonale: Secondary | ICD-10-CM | POA: Diagnosis not present

## 2022-07-04 NOTE — Progress Notes (Signed)
Bilateral lower extremity venous duplex has been completed. Preliminary results can be found in CV Proc through chart review.  Results were faxed to Dr. Francine Graven.  07/04/22 3:07 PM Olen Cordial RVT

## 2022-07-05 ENCOUNTER — Telehealth: Payer: Self-pay | Admitting: Pulmonary Disease

## 2022-07-05 ENCOUNTER — Telehealth: Payer: Self-pay | Admitting: Cardiology

## 2022-07-05 DIAGNOSIS — M79604 Pain in right leg: Secondary | ICD-10-CM

## 2022-07-05 NOTE — Telephone Encounter (Signed)
Spoke with patient of Dr. Bjorn Pippin.   She has been on methotrexate for about 1 year and it has been working well. She said her d/c summary said to discuss possibly changing this medication. She would like to know if OK to still take this, from a cardiac standpoint.   She is on: Methotrexate 2.5mg  twice daily only on Tuesdays  Ophthalmologist suggested if she needs to be switched: Azathioprine 50 mg 1 tab QD  She also sent a message to pulmonologist for him to review.  Routed to Dr. Bjorn Pippin

## 2022-07-05 NOTE — Telephone Encounter (Signed)
Pt c/o medication issue:  1. Name of Medication:   methotrexate (RHEUMATREX) 2.5 MG tablet    2. How are you currently taking this medication (dosage and times per day)? As prescribed.   3. Are you having a reaction (difficulty breathing--STAT)?   4. What is your medication issue?  Patient is requesting call back to discuss changing from this medication to a different one while on eliquis. Requesting return call.

## 2022-07-05 NOTE — Telephone Encounter (Signed)
methotrexate (RHEUMATREX) 2.5 MG tablet [[id number]]   Wants to know if she can still stay on med with starting an new med called Azathioprine 50 mg 1 tab every day has not stared yet wants medical advise

## 2022-07-06 NOTE — Telephone Encounter (Signed)
Okay to continue methotrexate from cardiac standpoint

## 2022-07-07 NOTE — Telephone Encounter (Signed)
Sometimes the combination is used but needs liver monitoring. We do not prescribe either of these. Looks like her ophthalmologist prescribes the methotrexate and I'm not sure who prescribed the azathioprine. She should contact the prescribing providers for further directions on use of the two.  Thanks.

## 2022-07-07 NOTE — Telephone Encounter (Signed)
Patient made aware.  Also discussed PET and CMR results.    Patient states she had a nurse from [facility] visit and did a PAD screening.  It was abnormal on the left leg.  She continues to have lower leg pain.  Pain is all day but worse with movement.     Discussed with Dr. Patsy Baltimore ABI+LEA to evaluate.   Patient aware and verbalized understanding.    Order placed

## 2022-07-10 ENCOUNTER — Telehealth: Payer: Self-pay | Admitting: Cardiology

## 2022-07-10 DIAGNOSIS — D869 Sarcoidosis, unspecified: Secondary | ICD-10-CM

## 2022-07-10 NOTE — Telephone Encounter (Signed)
Called transferred into triage, patient is concerned about the pain in her foot- she believes this is gout (I recommended she reach out to PCP to discuss this type of pain).   She also states that her recent leg scans that are scheduled should be sooner, as she should know what is going on with her legs and the pain in her legs. I advised I would see what we could do in getting this sooner.   She also states that she wants to increase her Verapamil, I advised that this was a 24 hour tablet but she states this helps with her pain and anxiety. She also states at times she increases her Lisinopril- she does not check her BP/HR during these times. Just states "she knows her body".   Does MD recommend to increase Methotrexate, with the recent findings in the heart scans? I advised that this was not normally a medication we changed, but she pushed to have this sent to Dr.Schumann to review.

## 2022-07-10 NOTE — Telephone Encounter (Signed)
Called and spoke w/ pt she verbalized that her cardiologist states that she can continue using the methotrexate and not take the new medication.

## 2022-07-10 NOTE — Telephone Encounter (Signed)
Patient calling and said that she has been experiencing bad foot pain right on the bone. Hard to walk on foot. Yesterday experienced nauseous and SOB and chest pain under left breath. Also wanted to know if the medication methotrexate can be given in a higher dosage

## 2022-07-10 NOTE — Telephone Encounter (Signed)
From notes from her ophthalmologist, recommendation was to stop methotrexate and start azathioprine to see if it helps with her vision issues.  We we can also see if this helps with inflammation in her heart, plan repeat cardiac PET scan in 6 months.  I do think she would benefit from following with a rheumatologist-would recommend referral if she does not see anyone yet

## 2022-07-10 NOTE — Telephone Encounter (Signed)
Patient is calling back to state that she is returning call from this morning. She states that her chest pain comes and goes daily and believes this is due to stress for her mother.  She states that her medication verapamil (VERELAN PM) 360 MG 24 hr capsule and potassium help with the pain.

## 2022-07-11 NOTE — Telephone Encounter (Signed)
Pt was very rude and frustrated; I read to the patient the last message that Dr Darryl Nestle responded with and she just "lost it" went on a rant of how unprofessional this is and that message had nothing to do with what she is asking. She was asking to speak with the Dr, I told her that I can send a message to the Dr saying that you are requesting him to call you. Told her that he is not in the office this week. She then asked about the other nurses she has already talked to, she would like to talk to them so she doesn't have to go through all of this again.  I apologized about all of the back and forth and the frustration. I asked her for clarification of "exactly what she needs so I can be sure to address exactly what she is asking for/about." I told her that I was sorry and that all I can do is look at the notes that the other nurses have left and try to figure out what is needed.  She wants to talk with [facility], who knows about what she is asking for.    Told her that I would have Hayley call her back

## 2022-07-11 NOTE — Telephone Encounter (Signed)
Called patient to discuss-rescheduled ABI for tomorrow at 8am.    Also discussed referral to Rheumatology-patient agreeable.  She reports seeing Dr. Azucena Fallen at [facility] in the past.  Would like to see her again.  Referral placed and faxed to [facility].

## 2022-07-11 NOTE — Telephone Encounter (Signed)
Patient was returning call. Please advise ?

## 2022-07-11 NOTE — Telephone Encounter (Signed)
Left message to call back  

## 2022-07-12 ENCOUNTER — Ambulatory Visit (HOSPITAL_COMMUNITY): Payer: Medicare HMO

## 2022-07-19 ENCOUNTER — Ambulatory Visit (HOSPITAL_COMMUNITY)
Admission: RE | Admit: 2022-07-19 | Discharge: 2022-07-19 | Disposition: A | Discharge status: Home / Self Care | Payer: Medicare HMO | Source: Ambulatory Visit | Attending: Cardiovascular Disease | Admitting: Cardiovascular Disease

## 2022-07-19 ENCOUNTER — Telehealth: Payer: Self-pay | Admitting: *Deleted

## 2022-07-19 DIAGNOSIS — M79604 Pain in right leg: Secondary | ICD-10-CM | POA: Diagnosis present

## 2022-07-19 DIAGNOSIS — M79605 Pain in left leg: Secondary | ICD-10-CM

## 2022-07-19 LAB — VAS US LOWER EXT ART SEG MULTI (SEGMENTALS & LE RAYNAUDS)
Left ABI: 0.99
Right ABI: 1.1

## 2022-07-19 NOTE — Telephone Encounter (Signed)
Patient in office for LEA-requested to discuss episode this morning.  Spoke to patient, patient reports around 5 am this morning she was trying to go to sleep, she placed her bipap on face and became hot, diaphoretic and had pain in her upper back/spine.   Reports pain increased with taking a deep breath.  Patient reports she was unable to sleep.  She states the feeling was similar to the episode in February when she was diagnosed with a  PE.  She is unsure if this is anxiety as she is also under a lot of stress currently.   She reports taking clonazepam and inhaler and did not get any relief.   Around 7 am she drank some hot tea, used a heating pad and tens unit. She checked her oxygen level and it was 88-94%.  She has oxygen at home (ties into her bipap) so she placed the oxygen on via [place].   She felt better after this and was able to nap.   She is unable to check her BP at home, does not have a BP cuff.   She reports also taking extra lisinopril and potassium 3 times weekly as this helps her when she gets anxious.    She also took 325 mg x 2 ASA today.   Denies missing doses of Xarelto.     Patient reports symptoms improved but reports constant pain in her back (due to back issues) and generalized pain.  Pain is present with a deep breath.      No distress noted at time.  Advised due to acute symptoms would recommend ER evaluation.  Patient does not want to go to ER.    First available appt scheduled but did encourage patient to proceed to ER if symptoms persist or worsen.  Also advised patient to take medication as prescribed.   Patient aware and verbalized understanding.   Sent to MD to make aware.

## 2022-07-19 NOTE — Telephone Encounter (Signed)
Called patient to offer appt tomorrow with Dr. Andee Poles declined and will keep appt as scheduled 5/28

## 2022-07-24 NOTE — Progress Notes (Deleted)
Cardiology Office Note:    Date:  05/10/2022   ID:  Julie Cruz, DOB 1965-06-18, MRN [id number]  PCP:  Leilani Able, MD  Cardiologist:  None  Electrophysiologist:  None   Referring MD: Leilani Able, MD   Chief Complaint  Patient presents with   Sarcoidosis    History of Present Illness:    Julie Cruz is a 57 y.o. female with a hx of sarcoidosis with cardiac involvement, PE, pulmonary hypertension, hypertension, fibromyalgia who presents for follow-up.  She was referred by Dr. Elberta Fortis for evaluation of cardiac sarcoid, initially seen on 05/09/2022.  Previously followed at [facility].  Diagnosed with sarcoid in 2007 via endobronchial biopsy.  Cardiac MRI 10/2016 showed normal biventricular size and systolic function, mid septal linear intramyocardial delayed enhancement.  Had PET scan in 2020 that showed no cardiac activity (prior study had shown mild uptake).  LHC in 2019 showed no obstructive CAD.  She had an episode of syncope and had loop implanted.  Device reached RRT, she elected not to have it explanted.  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  She has been on methotrexate by ophthalmology.  She was admitted 03/2022 with worsening shortness of breath.  CTPA showed bilateral PE and lower extremity duplex showed left lower extremity DVT.  Echo showed EF 65 to 70%, moderate RV dysfunction with mild RV enlargement.  She was started on heparin drip and transition to Eliquis on discharge.  States that she was having lightheadedness, chest pain, and shortness of breath.  All symptoms have improved since starting treatment for her PE.  She smoked 1 pack/week for over 20 years, quit when diagnosed with sarcoid in 2007.  No family history of cardiac disease.  Zio patch x 14 days 01/2022 showed no significant arrhythmias.  Echocardiogram 03/2022 showed EF 65 to 70%, mild LVH, normal diastolic function, moderate RV dysfunction.  Cardiac PET scan 06/20/2022 showed basal  inferior/inferolateral FDG uptake consistent with myocardial inflammation.  Cardiac MRI 06/30/2022 showed significant artifact throughout the study due to implanted loop recorder, LVEF 65%, RVEF 58%, basal septal mid wall LGE and RV insertion site LGE.  Since last clinic visit,   Past Medical History:  Diagnosis Date   Acute medial meniscus tear    Anxiety    Bipolar 1 disorder (HCC)    Depression    Fibromyalgia    H/O spinal cord injury    T, L ans CSpine   Hypertension    Menopause    Pulmonary hypertension (HCC)    Sarcoid, cardiac     Past Surgical History:  Procedure Laterality Date   C Section     Catherization      x2   COLONOSCOPY WITH PROPOFOL N/A 10/15/2020   Procedure: COLONOSCOPY WITH PROPOFOL;  Surgeon: Jeani Hawking, MD;  Location: [facility];  Service: Endoscopy;  Laterality: N/A;   Etopic  pregnacy     LUNG BIOPSY     TUBAL LIGATION      Current Medications: Current Meds  Medication Sig   acetaminophen (TYLENOL) 650 MG CR tablet Take 650 mg by mouth every 8 (eight) hours as needed for pain.   apixaban (ELIQUIS) 5 MG TABS tablet Take 1 tablet (5 mg total) by mouth 2 (two) times daily.   APIXABAN (ELIQUIS) VTE STARTER PACK (10MG  AND 5MG ) Take as directed on package: start with two-5mg  tablets twice daily for 7 days. On day 8, switch to one-5mg  tablet twice daily.   clonazePAM (KLONOPIN)  0.5 MG tablet Take 0.5 mg by mouth daily.   cyclobenzaprine (FLEXERIL) 10 MG tablet Take 10-20 mg by mouth daily as needed for muscle spasms.   Difluprednate 0.05 % EMUL Apply 1 drop to eye 4 (four) times daily. Into the right eye.   dorzolamide (TRUSOPT) 2 % ophthalmic solution Place 1 drop into the right eye 3 (three) times daily.   folic acid (FOLVITE) 1 MG tablet Take 1 tablet by mouth daily.   hydrochlorothiazide (HYDRODIURIL) 50 MG tablet Take 25 mg by mouth in the morning.   ipratropium-albuterol (DUONEB) 0.5-2.5 (3) MG/3ML SOLN Take 3 mLs by nebulization every 6 (six)  hours as needed.   lisinopril (ZESTRIL) 40 MG tablet Take 40 mg by mouth in the morning.   LUMIGAN 0.01 % SOLN Place 1 drop into both eyes at bedtime.   methotrexate (RHEUMATREX) 2.5 MG tablet Take 25 mg by mouth once a week. Caution:Chemotherapy. Protect from light. 10 tablets   naproxen (NAPROSYN) 500 MG tablet Take 1,000 mg by mouth daily as needed (pain.).   oxyCODONE-acetaminophen (PERCOCET) 10-325 MG tablet Take 1 tablet by mouth 3 (three) times daily.   pantoprazole (PROTONIX) 40 MG tablet Take 40 mg by mouth in the morning.   potassium chloride (KLOR-CON) 10 MEQ tablet Take 20 mEq by mouth in the morning.   sertraline (ZOLOFT) 100 MG tablet Take 200 mg by mouth in the morning.   traZODone (DESYREL) 50 MG tablet Take 50 mg by mouth at bedtime.   verapamil (VERELAN PM) 360 MG 24 hr capsule Take 360 mg by mouth in the morning.     Allergies:   Patient has no known allergies.   Social History   Socioeconomic History   Marital status: Single    Spouse name: Not on file   Number of children: Not on file   Years of education: Not on file   Highest education level: Not on file  Occupational History   Not on file  Tobacco Use   Smoking status: Former    Types: Cigarettes    Quit date: 2007    Years since quitting: 17.2   Smokeless tobacco: Never  Vaping Use   Vaping Use: Never used  Substance and Sexual Activity   Alcohol use: Yes    Alcohol/week: 2.0 standard drinks of alcohol    Types: 2 Glasses of wine per week    Comment: Drinks wine occasional   Drug use: Never   Sexual activity: Not Currently  Other Topics Concern   Not on file  Social History Narrative   Right Handed    Lives in a one story home - [place]    Social Determinants of Health   Financial Resource Strain: Not on file  Food Insecurity: Not on file  Transportation Needs: Not on file  Physical Activity: Not on file  Stress: Not on file  Social Connections: Not on file     Family History: The  patient's family history includes Asthma in her mother; Cancer in her father; Depression in her brother and mother; Diabetes in her father; HIV/AIDS in her brother; Heart attack in her brother; Hyperlipidemia in her mother; Rheum arthritis in her mother; Thyroid disease in her mother.  ROS:   Please see the history of present illness.     All other systems reviewed and are negative.  EKGs/Labs/Other Studies Reviewed:    The following studies were reviewed today:   EKG:   05/09/22: NSR, rate 94, nonspecific T wave flattening,  QTC 492  Recent Labs: 04/25/2022: ALT 50; B Natriuretic Peptide 364.8 04/26/2022: BUN 12; Creatinine, Ser 1.05; Hemoglobin 11.4; Magnesium 1.9; Platelets 133; Potassium 3.5; Sodium 141  Recent Lipid Panel No results found for: "CHOL", "TRIG", "HDL", "CHOLHDL", "VLDL", "LDLCALC", "LDLDIRECT"  Physical Exam:    VS:  BP 134/76   Pulse 94   Ht 5\' 11"  (1.803 m)   Wt (!) 337 lb (152.9 kg)   SpO2 96%   BMI 47.00 kg/m     Wt Readings from Last 3 Encounters:  05/09/22 (!) 337 lb (152.9 kg)  04/26/22 (!) 339 lb 15.2 oz (154.2 kg)  01/26/22 (!) 336 lb (152.4 kg)     GEN:  Well nourished, well developed in no acute distress HEENT: Normal NECK: No JVD; No carotid bruits LYMPHATICS: No lymphadenopathy CARDIAC: RRR, no murmurs, rubs, gallops RESPIRATORY:  Clear to auscultation without rales, wheezing or rhonchi  ABDOMEN: Soft, non-tender, non-distended MUSCULOSKELETAL:  No edema; No deformity  SKIN: Warm and dry NEUROLOGIC:  Alert and oriented x 3 PSYCHIATRIC:  Normal affect   ASSESSMENT:    1. Sarcoidosis   2. Pre-procedure lab exam   3. Medication management   4. Other acute pulmonary embolism with acute cor pulmonale (HCC)   5. Essential hypertension   6. Palpitations   7. Prolonged QT interval    PLAN:    Sarcoidosis: Diagnosed with sarcoid in 2007 via endobronchial biopsy.  Cardiac MRI 10/2016 showed normal biventricular size and systolic function,  mid septal linear intramyocardial delayed enhancement.  Had PET scan in 2020 that showed no cardiac activity (prior study had shown mild uptake).  LHC in 2019 showed no obstructive CAD.  She had an episode of syncope and had loop implanted.  No significant arrhythmias seen, device reached RRT, she elected not to have it explanted.  She underwent eye surgery on 03/08/22 due to uveitis.  She has been on methotrexate by ophthalmology.  Echocardiogram 03/2022 showed EF 65 to 70%, mild LVH, normal diastolic function, moderate RV dysfunction.  Cardiac PET scan 06/20/2022 showed basal inferior/inferolateral FDG uptake consistent with myocardial inflammation.  Cardiac MRI 06/30/2022 showed significant artifact throughout the study due to implanted loop recorder, LVEF 65%, RVEF 58%, basal septal mid wall LGE and RV insertion site LGE. -Referral to rheumatology*** -Plan repeat cardiac PET in 6 months to evaluate for improvement in myocardial inflammation  Acute PE: Diagnosed during admission 03/2022.  On Eliquis 5 mg twice daily.  Moderate RV dysfunction on echo 03/2022.  CMR 06/2022 showed normal RV systolic function  Palpitations: Zio patch x 14 days 01/2022 showed no significant arrhythmias.  Reports palpitations have improved  Hypertension: on HCTZ 25 mg daily and lisinopril 40 mg daily and verapamil 360 mg daily.   Prolonged QTc: Has had low potassium/magnesium, likely due to HCTZ use.  Will decrease HCTZ dose as above.  Check BMET/magnesium***  RTC in 3 months  Medication Adjustments/Labs and Tests Ordered: Current medicines are reviewed at length with the patient today.  Concerns regarding medicines are outlined above.  Orders Placed This Encounter  Procedures   NM PET CT MYOCARDIAL SARCOIDOSIS   MR CARDIAC MORPHOLOGY W WO CONTRAST   Basic metabolic panel   Magnesium   EKG 12-Lead   Meds ordered this encounter  Medications   apixaban (ELIQUIS) 5 MG TABS tablet    Sig: Take 1 tablet (5 mg total) by  mouth 2 (two) times daily.    Dispense:  60 tablet    Refill:  5    START this after finishing the starter dose pack    Patient Instructions  Medication Instructions:  DECREASE hydrochlorothiazide to 25mg  daily  *If you need a refill on your cardiac medications before your next appointment, please call your pharmacy*   Lab Work: NON-FASTING lab work 3/20 - 3/22   If you have labs (blood work) drawn today and your tests are completely normal, you will receive your results only by: MyChart Message (if you have MyChart) OR A paper copy in the mail If you have any lab test that is abnormal or we need to change your treatment, we will call you to review the results.   Testing/Procedures: Cardiac MRI -- [facility]  Cardiac PET Sarcoid -- [facility]   Follow-Up: At [facility], you and your health needs are our priority.  As part of our continuing mission to provide you with exceptional heart care, we have created designated Provider Care Teams.  These Care Teams include your primary Cardiologist (physician) and Advanced Practice Providers (APPs -  Physician Assistants and Nurse Practitioners) who all work together to provide you with the care you need, when you need it.  We recommend signing up for the patient portal called "MyChart".  Sign up information is provided on this After Visit Summary.  MyChart is used to connect with patients for Virtual Visits (Telemedicine).  Patients are able to view lab/test results, encounter notes, upcoming appointments, etc.  Non-urgent messages can be sent to your provider as well.   To learn more about what you can do with MyChart, go to [email or web address].    Your next appointment:    3 months with Dr. Bjorn Pippin  Other Instructions  Dr. Bjorn Pippin recommends checking your BP at home OMRON upper arm cuff is a preferred brand Please check BP twice daily for one week and call with readings or send in MyChart     You are  scheduled for Cardiac MRI on ______________. Please arrive for your appointment at ______________ ( arrive 30-45 minutes prior to test start time). ?  [facility] [address] [place], [place] [address] [phone] Please take advantage of the free valet parking available at the MAIN entrance (A entrance).  Proceed to the [facility] Radiology Department (First Floor) for check-in.   OR   [facility] [address] [place], [place] [address] [phone] Please take advantage of the free valet parking available at the MAIN entrance. Proceed to [facility] registration for check-in (first floor).  Magnetic resonance imaging (MRI) is a painless test that produces images of the inside of the body without using Xrays.  During an MRI, strong magnets and radio waves work together in a Data processing manager to form detailed images.   MRI images may provide more details about a medical condition than X-rays, CT scans, and ultrasounds can provide.  You may be given earphones to listen for instructions.  You may eat a light breakfast and take medications as ordered with the exception of furosemide, hydrochlorothiazide, or spironolactone(fluid pill, other). Please avoid stimulants for 12 hr prior to test. (Ie. Caffeine, nicotine, chocolate, or antihistamine medications)  If a contrast material will be used, an IV will be inserted into one of your veins. Contrast material will be injected into your IV. It will leave your body through your urine within a day. You may be told to drink plenty of fluids to help flush the contrast material out of your system.  You  will be asked to remove all metal, including: Watch, jewelry, and other metal objects including hearing aids, hair pieces and dentures. Also wearable glucose monitoring systems (ie. Freestyle Libre and Omnipods) (Braces and fillings normally are not a problem.)   TEST WILL TAKE APPROXIMATELY 1 HOUR  PLEASE  NOTIFY SCHEDULING AT LEAST 24 HOURS IN ADVANCE IF YOU ARE UNABLE TO KEEP YOUR APPOINTMENT. [phone]  Please call Rockwell Alexandria, cardiac imaging nurse navigator with any questions/concerns. Rockwell Alexandria RN Navigator Cardiac Imaging Larey Brick RN Navigator Cardiac Imaging [facility] [phone] Office     How to Prepare for Your Cardiac PET/CT Stress Test:  1. Please do not take these medications before your test:   Medications that may interfere with the cardiac pharmacological stress agent (ex. nitrates - including erectile dysfunction medications, isosorbide mononitrate, tamulosin or beta-blockers) the day of the exam. (Erectile dysfunction medication should be held for at least 72 hrs prior to test) Theophylline containing medications for 12 hours. Dipyridamole 48 hours prior to the test. Your remaining medications may be taken with water.  2. Nothing to eat or drink, except water, 3 hours prior to arrival time.   NO caffeine/decaffeinated products, or chocolate 12 hours prior to arrival.  3. NO perfume, cologne or lotion  4. Total time is 1 to 2 hours; you may want to bring reading material for the waiting time  5. Please report to Radiology at the [facility] Main Entrance 30 minutes early for your test.  [address] [place], [place] [address]  Diabetic Preparation:  Hold oral medications. You may take NPH and Lantus insulin. Do not take Humalog or Humulin R (Regular Insulin) the day of your test. Check blood sugars prior to leaving the house. If able to eat breakfast prior to 3 hour fasting, you may take all medications, including your insulin, Do not worry if you miss your breakfast dose of insulin - start at your next meal.  IF YOU THINK YOU MAY BE PREGNANT, OR ARE NURSING PLEASE INFORM THE TECHNOLOGIST.  In preparation for your appointment, medication and supplies will be purchased.  Appointment availability is  limited, so if you need to cancel or reschedule, please call the Radiology Department at [phone]  24 hours in advance to avoid a cancellation fee of $100.00  What to Expect After you Arrive:  Once you arrive and check in for your appointment, you will be taken to a preparation room within the Radiology Department.  A technologist or Nurse will obtain your medical history, verify that you are correctly prepped for the exam, and explain the procedure.  Afterwards,  an IV will be started in your arm and electrodes will be placed on your skin for EKG monitoring during the stress portion of the exam. Then you will be escorted to the PET/CT scanner.  There, staff will get you positioned on the scanner and obtain a blood pressure and EKG.  During the exam, you will continue to be connected to the EKG and blood pressure machines.  A small, safe amount of a radioactive tracer will be injected in your IV to obtain a series of pictures of your heart along with an injection of a stress agent.    After your Exam:  It is recommended that you eat a meal and drink a caffeinated beverage to counter act any effects of the stress agent.  Drink plenty of fluids for the remainder of the day and urinate frequently for  the first couple of hours after the exam.  Your doctor will inform you of your test results within 7-10 business days.  For questions about your test or how to prepare for your test, please call: Rockwell Alexandria, Cardiac Imaging Nurse Navigator  Larey Brick, Cardiac Imaging Nurse Navigator Office: [phone]            Signed, Little Ishikawa, MD  05/10/2022 1:57 PM    [facility]

## 2022-07-25 ENCOUNTER — Telehealth: Payer: Self-pay | Admitting: Cardiology

## 2022-07-25 ENCOUNTER — Other Ambulatory Visit: Payer: Self-pay

## 2022-07-25 ENCOUNTER — Ambulatory Visit: Payer: Medicare HMO | Admitting: Cardiology

## 2022-07-25 NOTE — Telephone Encounter (Signed)
Called patient, she states she was suppose to have an appointment today- however she is having bad spasms in her neck and head and had to cancel, she is going to wait until her rheumatology appointment.   Patient verbalized understanding to call us if she needs [name].  Thanks!

## 2022-07-25 NOTE — Telephone Encounter (Signed)
Increased bleeding risk with naproxen and Eliquis. Recommend she try acetaminophen

## 2022-07-25 NOTE — Telephone Encounter (Signed)
Patient is calling back requesting to speak with the nurse again. Patient has question in regards to medication called naproxen for pain. She states she takes eliquis and would like to know if she can take one or two to help.

## 2022-07-25 NOTE — Telephone Encounter (Signed)
Pt's pharmacy is requesting a refill on Lisinopril. Dr. Bjorn Pippin did not prescribe this medication. Would Dr. Bjorn Pippin like to refill this medication? Please address

## 2022-07-25 NOTE — Telephone Encounter (Signed)
Spoke to patient PharmD's advice given. °

## 2022-07-25 NOTE — Telephone Encounter (Signed)
Patient would like for Dr. Campbell Lerner nurse to give her a call.

## 2022-07-25 NOTE — Telephone Encounter (Signed)
Spoke to patient she stated she has severe pain in left shoulder from a old spinal injury.She wants to know if ok to take a Naproxen 500 mg.I will send message to our Pharm D for advice.

## 2022-07-26 ENCOUNTER — Encounter (HOSPITAL_COMMUNITY): Payer: Medicare HMO

## 2022-07-27 ENCOUNTER — Other Ambulatory Visit: Payer: Self-pay | Admitting: *Deleted

## 2022-07-27 ENCOUNTER — Inpatient Hospital Stay (HOSPITAL_COMMUNITY): Admission: RE | Admit: 2022-07-27 | Payer: Medicare HMO | Source: Ambulatory Visit

## 2022-07-27 ENCOUNTER — Encounter: Payer: Self-pay | Admitting: *Deleted

## 2022-07-27 DIAGNOSIS — Z79899 Other long term (current) drug therapy: Secondary | ICD-10-CM

## 2022-07-27 DIAGNOSIS — I10 Essential (primary) hypertension: Secondary | ICD-10-CM

## 2022-07-27 DIAGNOSIS — E876 Hypokalemia: Secondary | ICD-10-CM

## 2022-07-27 DIAGNOSIS — R9431 Abnormal electrocardiogram [ECG] [EKG]: Secondary | ICD-10-CM

## 2022-07-27 DIAGNOSIS — R002 Palpitations: Secondary | ICD-10-CM

## 2022-07-27 MED ORDER — LISINOPRIL 40 MG PO TABS: 40.0000 mg | ORAL_TABLET | Freq: Every morning | ORAL | 3 refills | Status: AC

## 2022-07-27 NOTE — Telephone Encounter (Signed)
We can refill.  Needs to come in for BMET though, recent hypokalemia on labs

## 2022-08-01 ENCOUNTER — Ambulatory Visit: Payer: Medicare HMO | Admitting: Pulmonary Disease

## 2022-08-03 ENCOUNTER — Telehealth: Payer: Self-pay

## 2022-08-03 NOTE — Telephone Encounter (Signed)
Received a call from patient.She requested to speak to Dr.Schumann or she needs to schedule appointment with Dr.Schumann.Stated her situation is very complicated.She just saw her ophthalmologist and he wants her to stop methotrexate and start azathioprine.She wants Dr.Schumann's advice.She also wants to ask Dr.Schumann what is the next step in her heart care.She wanted to let him know she is considering a new loop recorder.Advised Dr.Schumann is out of office this week and next week.I will send this message to him for advice.

## 2022-08-04 NOTE — Telephone Encounter (Signed)
Spoke to patient appointment scheduled with Dr.Schumann 6/7 at 11:00 am.

## 2022-08-04 NOTE — Telephone Encounter (Signed)
We have tried to bring in for appointment but she has cancelled her appointments.  Needs to come in to be seen.  Can we add on at 11am on 6/17?

## 2022-08-13 NOTE — Progress Notes (Unsigned)
Cardiology Office Note:    Date:  05/10/2022   ID:  Julie Cruz, DOB 10/03/1965, MRN [id number]  PCP:  Reese, Betti, MD  Cardiologist:  None  Electrophysiologist:  None   Referring MD: Reese, Betti, MD   Chief Complaint  Patient presents with   Sarcoidosis    History of Present Illness:    Julie Cruz is a 57 y.o. female with a hx of sarcoidosis with cardiac involvement, PE, pulmonary hypertension, hypertension, fibromyalgia who presents for follow-up.  She was referred by Dr. Camnitz for evaluation of cardiac sarcoid, initially seen on 05/09/2022.  Previously followed at [facility].  Diagnosed with sarcoid in 2007 via endobronchial biopsy.  Cardiac MRI 10/2016 showed normal biventricular size and systolic function, mid septal linear intramyocardial delayed enhancement.  Had PET scan in 2020 that showed no cardiac activity (prior study had shown mild uptake).  LHC in 2019 showed no obstructive CAD.  She had an episode of syncope and had loop implanted.  Device reached RRT, she elected not to have it explanted.  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  She has been on methotrexate by ophthalmology.  She was admitted 03/2022 with worsening shortness of breath.  CTPA showed bilateral PE and lower extremity duplex showed left lower extremity DVT.  Echo showed EF 65 to 70%, moderate RV dysfunction with mild RV enlargement.  She was started on heparin drip and transition to Eliquis on discharge.  States that she was having lightheadedness, chest pain, and shortness of breath.  All symptoms have improved since starting treatment for her PE.  She smoked 1 pack/week for over 20 years, quit when diagnosed with sarcoid in 2007.  No family history of cardiac disease.  Zio patch x 14 days 01/2022 showed no significant arrhythmias.  Echocardiogram 03/2022 showed EF 65 to 70%, mild LVH, normal diastolic function, moderate RV dysfunction.  Cardiac PET scan 06/20/2022 showed basal  inferior/inferolateral FDG uptake consistent with myocardial inflammation.  Cardiac MRI 06/30/2022 showed significant artifact throughout the study due to implanted loop recorder, LVEF 65%, RVEF 58%, basal septal mid wall LGE and RV insertion site LGE.  Since last clinic visit,   Past Medical History:  Diagnosis Date   Acute medial meniscus tear    Anxiety    Bipolar 1 disorder (HCC)    Depression    Fibromyalgia    H/O spinal cord injury    T, L ans CSpine   Hypertension    Menopause    Pulmonary hypertension (HCC)    Sarcoid, cardiac     Past Surgical History:  Procedure Laterality Date   C Section     Catherization      x2   COLONOSCOPY WITH PROPOFOL N/A 10/15/2020   Procedure: COLONOSCOPY WITH PROPOFOL;  Surgeon: Hung, Patrick, MD;  Location: [facility];  Service: Endoscopy;  Laterality: N/A;   Etopic  pregnacy     LUNG BIOPSY     TUBAL LIGATION      Current Medications: Current Meds  Medication Sig   acetaminophen (TYLENOL) 650 MG CR tablet Take 650 mg by mouth every 8 (eight) hours as needed for pain.   apixaban (ELIQUIS) 5 MG TABS tablet Take 1 tablet (5 mg total) by mouth 2 (two) times daily.   APIXABAN (ELIQUIS) VTE STARTER PACK (10MG AND 5MG) Take as directed on package: start with two-5mg tablets twice daily for 7 days. On day 8, switch to one-5mg tablet twice daily.   clonazePAM (KLONOPIN)   0.5 MG tablet Take 0.5 mg by mouth daily.   cyclobenzaprine (FLEXERIL) 10 MG tablet Take 10-20 mg by mouth daily as needed for muscle spasms.   Difluprednate 0.05 % EMUL Apply 1 drop to eye 4 (four) times daily. Into the right eye.   dorzolamide (TRUSOPT) 2 % ophthalmic solution Place 1 drop into the right eye 3 (three) times daily.   folic acid (FOLVITE) 1 MG tablet Take 1 tablet by mouth daily.   hydrochlorothiazide (HYDRODIURIL) 50 MG tablet Take 25 mg by mouth in the morning.   ipratropium-albuterol (DUONEB) 0.5-2.5 (3) MG/3ML SOLN Take 3 mLs by nebulization every 6 (six)  hours as needed.   lisinopril (ZESTRIL) 40 MG tablet Take 40 mg by mouth in the morning.   LUMIGAN 0.01 % SOLN Place 1 drop into both eyes at bedtime.   methotrexate (RHEUMATREX) 2.5 MG tablet Take 25 mg by mouth once a week. Caution:Chemotherapy. Protect from light. 10 tablets   naproxen (NAPROSYN) 500 MG tablet Take 1,000 mg by mouth daily as needed (pain.).   oxyCODONE-acetaminophen (PERCOCET) 10-325 MG tablet Take 1 tablet by mouth 3 (three) times daily.   pantoprazole (PROTONIX) 40 MG tablet Take 40 mg by mouth in the morning.   potassium chloride (KLOR-CON) 10 MEQ tablet Take 20 mEq by mouth in the morning.   sertraline (ZOLOFT) 100 MG tablet Take 200 mg by mouth in the morning.   traZODone (DESYREL) 50 MG tablet Take 50 mg by mouth at bedtime.   verapamil (VERELAN PM) 360 MG 24 hr capsule Take 360 mg by mouth in the morning.     Allergies:   Patient has no known allergies.   Social History   Socioeconomic History   Marital status: Single    Spouse name: Not on file   Number of children: Not on file   Years of education: Not on file   Highest education level: Not on file  Occupational History   Not on file  Tobacco Use   Smoking status: Former    Types: Cigarettes    Quit date: 2007    Years since quitting: 17.2   Smokeless tobacco: Never  Vaping Use   Vaping Use: Never used  Substance and Sexual Activity   Alcohol use: Yes    Alcohol/week: 2.0 standard drinks of alcohol    Types: 2 Glasses of wine per week    Comment: Drinks wine occasional   Drug use: Never   Sexual activity: Not Currently  Other Topics Concern   Not on file  Social History Narrative   Right Handed    Lives in a one story home - [place]    Social Determinants of Health   Financial Resource Strain: Not on file  Food Insecurity: Not on file  Transportation Needs: Not on file  Physical Activity: Not on file  Stress: Not on file  Social Connections: Not on file     Family History: The  patient's family history includes Asthma in her mother; Cancer in her father; Depression in her brother and mother; Diabetes in her father; HIV/AIDS in her brother; Heart attack in her brother; Hyperlipidemia in her mother; Rheum arthritis in her mother; Thyroid disease in her mother.  ROS:   Please see the history of present illness.     All other systems reviewed and are negative.  EKGs/Labs/Other Studies Reviewed:    The following studies were reviewed today:   EKG:   05/09/22: NSR, rate 94, nonspecific T wave flattening,   QTC 492  Recent Labs: 04/25/2022: ALT 50; B Natriuretic Peptide 364.8 04/26/2022: BUN 12; Creatinine, Ser 1.05; Hemoglobin 11.4; Magnesium 1.9; Platelets 133; Potassium 3.5; Sodium 141  Recent Lipid Panel No results found for: "CHOL", "TRIG", "HDL", "CHOLHDL", "VLDL", "LDLCALC", "LDLDIRECT"  Physical Exam:    VS:  BP 134/76   Pulse 94   Ht 5' 11" (1.803 m)   Wt (!) 337 lb (152.9 kg)   SpO2 96%   BMI 47.00 kg/m     Wt Readings from Last 3 Encounters:  05/09/22 (!) 337 lb (152.9 kg)  04/26/22 (!) 339 lb 15.2 oz (154.2 kg)  01/26/22 (!) 336 lb (152.4 kg)     GEN:  Well nourished, well developed in no acute distress HEENT: Normal NECK: No JVD; No carotid bruits LYMPHATICS: No lymphadenopathy CARDIAC: RRR, no murmurs, rubs, gallops RESPIRATORY:  Clear to auscultation without rales, wheezing or rhonchi  ABDOMEN: Soft, non-tender, non-distended MUSCULOSKELETAL:  No edema; No deformity  SKIN: Warm and dry NEUROLOGIC:  Alert and oriented x 3 PSYCHIATRIC:  Normal affect   ASSESSMENT:    1. Sarcoidosis   2. Pre-procedure lab exam   3. Medication management   4. Other acute pulmonary embolism with acute cor pulmonale (HCC)   5. Essential hypertension   6. Palpitations   7. Prolonged QT interval    PLAN:    Sarcoidosis: Diagnosed with sarcoid in 2007 via endobronchial biopsy.  Cardiac MRI 10/2016 showed normal biventricular size and systolic function,  mid septal linear intramyocardial delayed enhancement.  Had PET scan in 2020 that showed no cardiac activity (prior study had shown mild uptake).  LHC in 2019 showed no obstructive CAD.  She had an episode of syncope and had loop implanted.  No significant arrhythmias seen, device reached RRT, she elected not to have it explanted.  She underwent eye surgery on 03/08/22 due to uveitis.  She has been on methotrexate by ophthalmology.  Echocardiogram 03/2022 showed EF 65 to 70%, mild LVH, normal diastolic function, moderate RV dysfunction.  Cardiac PET scan 06/20/2022 showed basal inferior/inferolateral FDG uptake consistent with myocardial inflammation.  Cardiac MRI 06/30/2022 showed significant artifact throughout the study due to implanted loop recorder, LVEF 65%, RVEF 58%, basal septal mid wall LGE and RV insertion site LGE. -Referral to rheumatology*** -Plan repeat cardiac PET in 6 months to evaluate for improvement in myocardial inflammation  Acute PE: Diagnosed during admission 03/2022.  On Eliquis 5 mg twice daily.  Moderate RV dysfunction on echo 03/2022.  CMR 06/2022 showed normal RV systolic function  Palpitations: Zio patch x 14 days 01/2022 showed no significant arrhythmias.  Reports palpitations have improved  Hypertension: on HCTZ 25 mg daily and lisinopril 40 mg daily and verapamil 360 mg daily.   Prolonged QTc: Has had low potassium/magnesium, likely due to HCTZ use.  Will decrease HCTZ dose as above.  Check BMET/magnesium***  RTC in 3 months  Medication Adjustments/Labs and Tests Ordered: Current medicines are reviewed at length with the patient today.  Concerns regarding medicines are outlined above.  Orders Placed This Encounter  Procedures   NM PET CT MYOCARDIAL SARCOIDOSIS   MR CARDIAC MORPHOLOGY W WO CONTRAST   Basic metabolic panel   Magnesium   EKG 12-Lead   Meds ordered this encounter  Medications   apixaban (ELIQUIS) 5 MG TABS tablet    Sig: Take 1 tablet (5 mg total) by  mouth 2 (two) times daily.    Dispense:  60 tablet    Refill:    5    START this after finishing the starter dose pack    Patient Instructions  Medication Instructions:  DECREASE hydrochlorothiazide to 25mg daily  *If you need a refill on your cardiac medications before your next appointment, please call your pharmacy*   Lab Work: NON-FASTING lab work 3/20 - 3/22   If you have labs (blood work) drawn today and your tests are completely normal, you will receive your results only by: MyChart Message (if you have MyChart) OR A paper copy in the mail If you have any lab test that is abnormal or we need to change your treatment, we will call you to review the results.   Testing/Procedures: Cardiac MRI -- [place]  Cardiac PET Sarcoid -- [place]   Follow-Up: At [facility], you and your health needs are our priority.  As part of our continuing mission to provide you with exceptional heart care, we have created designated Provider Care Teams.  These Care Teams include your primary Cardiologist (physician) and Advanced Practice Providers (APPs -  Physician Assistants and Nurse Practitioners) who all work together to provide you with the care you need, when you need it.  We recommend signing up for the patient portal called "MyChart".  Sign up information is provided on this After Visit Summary.  MyChart is used to connect with patients for Virtual Visits (Telemedicine).  Patients are able to view lab/test results, encounter notes, upcoming appointments, etc.  Non-urgent messages can be sent to your provider as well.   To learn more about what you can do with MyChart, go to [email or web address].    Your next appointment:    3 months with Dr. Milda Lindvall  Other Instructions  Dr. Tahisha Hakim recommends checking your BP at home OMRON upper arm cuff is a preferred brand Please check BP twice daily for one week and call with readings or send in MyChart     You are  scheduled for Cardiac MRI on ______________. Please arrive for your appointment at ______________ ( arrive 30-45 minutes prior to test start time). ?  [facility] [address] [place], [place] [address] [phone] Please take advantage of the free valet parking available at the MAIN entrance (A entrance).  Proceed to the  Radiology Department (First Floor) for check-in.   OR   [facility] [address] [place], [place] [address] [phone] Please take advantage of the free valet parking available at the MAIN entrance. Proceed to [facility] registration for check-in (first floor).  Magnetic resonance imaging (MRI) is a painless test that produces images of the inside of the body without using Xrays.  During an MRI, strong magnets and radio waves work together in a magnetic field to form detailed images.   MRI images may provide more details about a medical condition than X-rays, CT scans, and ultrasounds can provide.  You may be given earphones to listen for instructions.  You may eat a light breakfast and take medications as ordered with the exception of furosemide, hydrochlorothiazide, or spironolactone(fluid pill, other). Please avoid stimulants for 12 hr prior to test. (Ie. Caffeine, nicotine, chocolate, or antihistamine medications)  If a contrast material will be used, an IV will be inserted into one of your veins. Contrast material will be injected into your IV. It will leave your body through your urine within a day. You may be told to drink plenty of fluids to help flush the contrast material out of your system.  You   will be asked to remove all metal, including: Watch, jewelry, and other metal objects including hearing aids, hair pieces and dentures. Also wearable glucose monitoring systems (ie. Freestyle Libre and Omnipods) (Braces and fillings normally are not a problem.)   TEST WILL TAKE APPROXIMATELY 1 HOUR  PLEASE  NOTIFY SCHEDULING AT LEAST 24 HOURS IN ADVANCE IF YOU ARE UNABLE TO KEEP YOUR APPOINTMENT. [phone]  Please call Sara Wallace, cardiac imaging nurse navigator with any questions/concerns. Sara Wallace RN Navigator Cardiac Imaging Merle Prescott RN Navigator Cardiac Imaging [facility] [phone] Office     How to Prepare for Your Cardiac PET/CT Stress Test:  1. Please do not take these medications before your test:   Medications that may interfere with the cardiac pharmacological stress agent (ex. nitrates - including erectile dysfunction medications, isosorbide mononitrate, tamulosin or beta-blockers) the day of the exam. (Erectile dysfunction medication should be held for at least 72 hrs prior to test) Theophylline containing medications for 12 hours. Dipyridamole 48 hours prior to the test. Your remaining medications may be taken with water.  2. Nothing to eat or drink, except water, 3 hours prior to arrival time.   NO caffeine/decaffeinated products, or chocolate 12 hours prior to arrival.  3. NO perfume, cologne or lotion  4. Total time is 1 to 2 hours; you may want to bring reading material for the waiting time  5. Please report to Radiology at the [facility] Main Entrance 30 minutes early for your test.  [address]  [place], [place] [address]  Diabetic Preparation:  Hold oral medications. You may take NPH and Lantus insulin. Do not take Humalog or Humulin R (Regular Insulin) the day of your test. Check blood sugars prior to leaving the house. If able to eat breakfast prior to 3 hour fasting, you may take all medications, including your insulin, Do not worry if you miss your breakfast dose of insulin - start at your next meal.  IF YOU THINK YOU MAY BE PREGNANT, OR ARE NURSING PLEASE INFORM THE TECHNOLOGIST.  In preparation for your appointment, medication and supplies will be purchased.  Appointment availability is  limited, so if you need to cancel or reschedule, please call the Radiology Department at [phone]  24 hours in advance to avoid a cancellation fee of $100.00  What to Expect After you Arrive:  Once you arrive and check in for your appointment, you will be taken to a preparation room within the Radiology Department.  A technologist or Nurse will obtain your medical history, verify that you are correctly prepped for the exam, and explain the procedure.  Afterwards,  an IV will be started in your arm and electrodes will be placed on your skin for EKG monitoring during the stress portion of the exam. Then you will be escorted to the PET/CT scanner.  There, staff will get you positioned on the scanner and obtain a blood pressure and EKG.  During the exam, you will continue to be connected to the EKG and blood pressure machines.  A small, safe amount of a radioactive tracer will be injected in your IV to obtain a series of pictures of your heart along with an injection of a stress agent.    After your Exam:  It is recommended that you eat a meal and drink a caffeinated beverage to counter act any effects of the stress agent.  Drink plenty of fluids for the remainder of the day and urinate frequently for   the first couple of hours after the exam.  Your doctor will inform you of your test results within 7-10 business days.  For questions about your test or how to prepare for your test, please call: Sara Wallace, Cardiac Imaging Nurse Navigator  Merle Prescott, Cardiac Imaging Nurse Navigator Office: [phone]            Signed, Jodeci Roarty L Cass Vandermeulen, MD  05/10/2022 1:57 PM    [facility] 

## 2022-08-14 ENCOUNTER — Ambulatory Visit: Payer: Medicare HMO | Attending: Cardiology | Admitting: Cardiology

## 2022-08-14 ENCOUNTER — Encounter: Payer: Self-pay | Admitting: Cardiology

## 2022-08-14 VITALS — BP 124/88 | HR 71 | Ht 71.0 in | Wt 330.5 lb

## 2022-08-14 DIAGNOSIS — R002 Palpitations: Secondary | ICD-10-CM

## 2022-08-14 DIAGNOSIS — D869 Sarcoidosis, unspecified: Secondary | ICD-10-CM

## 2022-08-14 DIAGNOSIS — R0609 Other forms of dyspnea: Secondary | ICD-10-CM

## 2022-08-14 DIAGNOSIS — R9431 Abnormal electrocardiogram [ECG] [EKG]: Secondary | ICD-10-CM

## 2022-08-14 DIAGNOSIS — I10 Essential (primary) hypertension: Secondary | ICD-10-CM | POA: Diagnosis not present

## 2022-08-14 NOTE — Patient Instructions (Addendum)
Medication Instructions:  Continue same medications *If you need a refill on your cardiac medications before your next appointment, please call your pharmacy*   Lab Work: Cmet,cbc,magnesium today   Testing/Procedures: Cardiac Pet Sarcoid to be scheduled in 01/2023   Follow instructions below   Follow-Up: At [facility], you and your health needs are our priority.  As part of our continuing mission to provide you with exceptional heart care, we have created designated Provider Care Teams.  These Care Teams include your primary Cardiologist (physician) and Advanced Practice Providers (APPs -  Physician Assistants and Nurse Practitioners) who all work together to provide you with the care you need, when you need it.  We recommend signing up for the patient portal called "MyChart".  Sign up information is provided on this After Visit Summary.  MyChart is used to connect with patients for Virtual Visits (Telemedicine).  Patients are able to view lab/test results, encounter notes, upcoming appointments, etc.  Non-urgent messages can be sent to your provider as well.   To learn more about what you can do with MyChart, go to [email or web address].    Your next appointment:  3 months    Provider: Dr.Schumann    Appointment with Heart Failure Clinic  You will be called with appointment   How to Prepare for Your Cardiac PET/CT Stress Test:  1. Please do not take these medications before your test:   Medications that may interfere with the cardiac pharmacological stress agent (ex. nitrates - including erectile dysfunction medications, isosorbide mononitrate, tamulosin or beta-blockers) the day of the exam. (Erectile dysfunction medication should be held for at least 72 hrs prior to test) Theophylline containing medications for 12 hours. Dipyridamole 48 hours prior to the test. Your remaining medications may be taken with water.  2. Nothing to eat or drink, except water, 3 hours  prior to arrival time.   NO caffeine/decaffeinated products, or chocolate 12 hours prior to arrival.  3. NO perfume, cologne or lotion  4. Total time is 1 to 2 hours; you may want to bring reading material for the waiting time.  5. Please report to Radiology at the [facility] Main Entrance 30 minutes early for your test.  [address] [place], [place] [address]  Diabetic Preparation:  Hold oral medications. You may take NPH and Lantus insulin. Do not take Humalog or Humulin R (Regular Insulin) the day of your test. Check blood sugars prior to leaving the house. If able to eat breakfast prior to 3 hour fasting, you may take all medications, including your insulin, Do not worry if you miss your breakfast dose of insulin - start at your next meal.    In preparation for your appointment, medication and supplies will be purchased.  Appointment availability is limited, so if you need to cancel or reschedule, please call the Radiology Department at [phone]  24 hours in advance to avoid a cancellation fee of $100.00  What to Expect After you Arrive:  Once you arrive and check in for your appointment, you will be taken to a preparation room within the Radiology Department.  A technologist or Nurse will obtain your medical history, verify that you are correctly prepped for the exam, and explain the procedure.  Afterwards,  an IV will be started in your arm and electrodes will be placed on your skin for EKG monitoring during the stress portion of the exam. Then you will be escorted to the PET/CT scanner.  There, staff will  get you positioned on the scanner and obtain a blood pressure and EKG.  During the exam, you will continue to be connected to the EKG and blood pressure machines.  A small, safe amount of a radioactive tracer will be injected in your IV to obtain a series of pictures of your heart along with an injection of a stress agent.    After your Exam:  It is  recommended that you eat a meal and drink a caffeinated beverage to counter act any effects of the stress agent.  Drink plenty of fluids for the remainder of the day and urinate frequently for the first couple of hours after the exam.  Your doctor will inform you of your test results within 7-10 business days.  For questions about your test or how to prepare for your test, please call: Rockwell Alexandria, Cardiac Imaging Nurse Navigator  Larey Brick, Cardiac Imaging Nurse Navigator Office: [phone]

## 2022-08-15 LAB — CBC WITH DIFFERENTIAL/PLATELET
Basophils Absolute: 0 10*3/uL (ref 0.0–0.2)
Basos: 1 %
EOS (ABSOLUTE): 0.1 10*3/uL (ref 0.0–0.4)
Eos: 3 %
Hematocrit: 36.1 % (ref 34.0–46.6)
Hemoglobin: 12.3 g/dL (ref 11.1–15.9)
Immature Grans (Abs): 0 10*3/uL (ref 0.0–0.1)
Immature Granulocytes: 0 %
Lymphocytes Absolute: 1.6 10*3/uL (ref 0.7–3.1)
Lymphs: 41 %
MCH: 32.7 pg (ref 26.6–33.0)
MCHC: 34.1 g/dL (ref 31.5–35.7)
MCV: 96 fL (ref 79–97)
Monocytes Absolute: 0.4 10*3/uL (ref 0.1–0.9)
Monocytes: 10 %
Neutrophils Absolute: 1.7 10*3/uL (ref 1.4–7.0)
Neutrophils: 45 %
Platelets: 162 10*3/uL (ref 150–450)
RBC: 3.76 x10E6/uL — ABNORMAL LOW (ref 3.77–5.28)
RDW: 15.6 % — ABNORMAL HIGH (ref 11.7–15.4)
WBC: 3.8 10*3/uL (ref 3.4–10.8)

## 2022-08-15 LAB — COMPREHENSIVE METABOLIC PANEL
ALT: 24 IU/L (ref 0–32)
AST: 21 IU/L (ref 0–40)
Albumin: 4.4 g/dL (ref 3.8–4.9)
Alkaline Phosphatase: 60 IU/L (ref 44–121)
BUN/Creatinine Ratio: 10 (ref 9–23)
BUN: 10 mg/dL (ref 6–24)
Bilirubin Total: 0.4 mg/dL (ref 0.0–1.2)
CO2: 28 mmol/L (ref 20–29)
Calcium: 9.5 mg/dL (ref 8.7–10.2)
Chloride: 99 mmol/L (ref 96–106)
Creatinine, Ser: 1.04 mg/dL — ABNORMAL HIGH (ref 0.57–1.00)
Globulin, Total: 2.7 g/dL (ref 1.5–4.5)
Glucose: 87 mg/dL (ref 70–99)
Potassium: 3.6 mmol/L (ref 3.5–5.2)
Sodium: 142 mmol/L (ref 134–144)
Total Protein: 7.1 g/dL (ref 6.0–8.5)
eGFR: 63 mL/min/{1.73_m2} (ref >59)

## 2022-08-15 LAB — MAGNESIUM: Magnesium: 2.1 mg/dL (ref 1.6–2.3)

## 2022-08-29 ENCOUNTER — Telehealth: Payer: Self-pay | Admitting: Cardiology

## 2022-08-29 NOTE — Telephone Encounter (Signed)
Pt needs a referral sent to [facility] for pt states this is an urgent matter and would like a callback regarding this matter. Please advise.

## 2022-08-29 NOTE — Telephone Encounter (Signed)
Spoke to the pt, referral was sent to [facility] for Cardiac Sarcoid. Pt was advised by [facility] to contact the clinic and submit another referral this time check the urgent box. This why the patient can be scheduled sooner. Will forward to MD and nurse.

## 2022-08-30 ENCOUNTER — Encounter (HOSPITAL_COMMUNITY): Payer: Medicare HMO | Admitting: Cardiology

## 2022-08-30 NOTE — Telephone Encounter (Signed)
Called patient left message on personal voice mail to call back. 

## 2022-08-30 NOTE — Telephone Encounter (Signed)
When I saw her in clinic we did not talk about a referral to [facility].  We did talk about referring to our advanced heart failure clinic here at [facility], who are our sarcoid specialists.  She has an appointment on 7/5.  Would recommend going to this appointment

## 2022-08-30 NOTE — Telephone Encounter (Signed)
Spoke to patient she stated she has appointment with Heart Failure clinic at [facility] 7/5.Stated Dr.Schumann wanted her to see a Publishing rights manager at [employer].I will send message to Dr.Schumann.

## 2022-09-01 ENCOUNTER — Encounter (HOSPITAL_COMMUNITY): Payer: Self-pay | Admitting: Cardiology

## 2022-09-01 ENCOUNTER — Ambulatory Visit (HOSPITAL_COMMUNITY)
Admission: RE | Admit: 2022-09-01 | Discharge: 2022-09-01 | Disposition: A | Discharge status: Home / Self Care | Payer: Medicare HMO | Source: Ambulatory Visit | Attending: Cardiology | Admitting: Cardiology

## 2022-09-01 VITALS — BP 120/88 | HR 86 | Wt 329.0 lb

## 2022-09-01 DIAGNOSIS — I272 Pulmonary hypertension, unspecified: Secondary | ICD-10-CM | POA: Diagnosis not present

## 2022-09-01 DIAGNOSIS — I5022 Chronic systolic (congestive) heart failure: Secondary | ICD-10-CM | POA: Insufficient documentation

## 2022-09-01 DIAGNOSIS — D869 Sarcoidosis, unspecified: Secondary | ICD-10-CM | POA: Diagnosis not present

## 2022-09-01 MED ORDER — PREDNISONE 5 MG PO TABS: 5.0000 mg | ORAL_TABLET | Freq: Every day | ORAL | 5 refills | Status: DC

## 2022-09-01 NOTE — Progress Notes (Signed)
ADVANCED HEART FAILURE CLINIC NOTE  Referring Physician: Leilani Able, MD  Primary Care: Julie Able, MD Primary Cardiologist:  HPI: Julie Cruz is a 57 y.o. female with with pulmonary hypertension, hypertension, fibromyalgia and pulmonary and cardiac sarcoid presenting today to establish care.  Based on chart review it appears that she was initially diagnosed with sarcoid in 2007 at [facility] via endobronchial biopsy.  In September 2018 she had a cardiac MRI with normal LV/RV function but linear area of intramyocardial LGE at the septum.  She had a PET scan in 2020 that showed no cardiac uptake. Admitted in 2/24 for SOB w/ CTPE demonstrating B/L PE; noted to have LLE DVT. TTE at that [facility] with LVEF of 65%, moderate RV dysfunction. Cardiac PET in 4/24 with FDG uptake in the basal inferior/inferolateral walls consistent with LGE pattern on CMR.   Julie Cruz reports that she is very limited due to diffuse pain and fatigue.  She is intolerant to any steroids, reports that she has severe depression and that this depression is exacerbated by any steroid use.   Past Medical History:  Diagnosis Date   Acute medial meniscus tear    Anxiety    Bipolar 1 disorder (HCC)    Depression    Fibromyalgia    H/O spinal cord injury    T, L ans CSpine   Hypertension    Menopause    Pulmonary hypertension (HCC)    Sarcoid, cardiac     Current Outpatient Medications  Medication Sig Dispense Refill   acetaminophen (TYLENOL) 650 MG CR tablet Take 650 mg by mouth every 8 (eight) hours as needed for pain.     apixaban (ELIQUIS) 5 MG TABS tablet Take 1 tablet (5 mg total) by mouth 2 (two) times daily. 180 tablet 3   clonazePAM (KLONOPIN) 0.5 MG tablet Take 0.5 mg by mouth daily.     Difluprednate 0.05 % EMUL Apply 1 drop to eye 4 (four) times daily. Into the right eye.     dorzolamide (TRUSOPT) 2 % ophthalmic solution Place 1 drop into the right eye 3 (three) times daily.     folic acid  (FOLVITE) 1 MG tablet Take 1 tablet by mouth daily.     hydrochlorothiazide (HYDRODIURIL) 50 MG tablet Take 25 mg by mouth in the morning.     ipratropium-albuterol (DUONEB) 0.5-2.5 (3) MG/3ML SOLN Take 3 mLs by nebulization every 6 (six) hours as needed. 1080 mL 3   lisinopril (ZESTRIL) 40 MG tablet Take 1 tablet (40 mg total) by mouth in the morning. 90 tablet 3   LUMIGAN 0.01 % SOLN Place 1 drop into both eyes at bedtime.     methotrexate (RHEUMATREX) 2.5 MG tablet Take 25 mg by mouth once a week. Caution:Chemotherapy. Protect from light. 10 tablets     oxyCODONE-acetaminophen (PERCOCET) 10-325 MG tablet Take 1 tablet by mouth 3 (three) times daily.     pantoprazole (PROTONIX) 40 MG tablet Take 40 mg by mouth in the morning.     potassium chloride (KLOR-CON) 10 MEQ tablet Take 20 mEq by mouth in the morning.     predniSONE (DELTASONE) 5 MG tablet Take 1 tablet (5 mg total) by mouth daily with breakfast. 30 tablet 5   sertraline (ZOLOFT) 100 MG tablet Take 200 mg by mouth in the morning.     traZODone (DESYREL) 50 MG tablet Take 50 mg by mouth at bedtime.     verapamil (VERELAN PM) 360 MG 24 hr capsule Take  360 mg by mouth in the morning.     albuterol (VENTOLIN HFA) 108 (90 Base) MCG/ACT inhaler Inhale 2 puffs into the lungs every 6 (six) hours as needed for wheezing or shortness of breath. 18 g 3   cyclobenzaprine (FLEXERIL) 10 MG tablet Take 10-20 mg by mouth daily as needed for muscle spasms. (Patient not taking: Reported on 09/01/2022)     No current facility-administered medications for this encounter.    No Known Allergies    Social History   Socioeconomic History   Marital status: Single    Spouse name: Not on file   Number of children: Not on file   Years of education: Not on file   Highest education level: Not on file  Occupational History   Not on file  Tobacco Use   Smoking status: Former    Current packs/day: 0.00    Types: Cigarettes    Quit date: 2007    Years  since quitting: 17.5   Smokeless tobacco: Never  Vaping Use   Vaping status: Never Used  Substance and Sexual Activity   Alcohol use: Yes    Alcohol/week: 2.0 standard drinks of alcohol    Types: 2 Glasses of wine per week    Comment: Drinks wine occasional   Drug use: Never   Sexual activity: Not Currently  Other Topics Concern   Not on file  Social History Narrative   Right Handed    Lives in a one story home - [place]    Social Determinants of Health   Financial Resource Strain: Not on file  Food Insecurity: Not on file  Transportation Needs: Not on file  Physical Activity: Not on file  Stress: Not on file  Social Connections: Unknown (12/27/2021)   [facility]   Social Network    Social Network: Not on file  Intimate Partner Violence: Unknown (12/27/2021)   Received from [facility]   HITS    Physically Hurt: Not on file    Insult or Talk Down To: Not on file    Threaten Physical Harm: Not on file    Scream or Curse: Not on file      Family History  Problem Relation Age of Onset   Hyperlipidemia Mother    Asthma Mother    Thyroid disease Mother    Depression Mother    Rheum arthritis Mother    Cancer Father        Pancreatic Cancer   Diabetes Father    Heart attack Brother    Depression Brother    HIV/AIDS Brother     PHYSICAL EXAM: Vitals:   09/01/22 1521  BP: 120/88  Pulse: 86  SpO2: 97%   GENERAL: Well nourished, well developed, and in no apparent distress at rest.  HEENT: Negative for arcus senilis or xanthelasma. There is no scleral icterus.  The mucous membranes are pink and moist.   NECK: Supple, No masses. Normal carotid upstrokes without bruits. No masses or thyromegaly.    CHEST: There are no chest wall deformities. There is no chest wall tenderness. Respirations are unlabored.  Lungs- CTA B/L CARDIAC:  JVP: 7 cm H2O         Normal S1, S2  Normal rate with regular rhythm. No murmurs, rubs or gallops.  Pulses are 2+  and symmetrical in upper and lower extremities. No edema.  ABDOMEN: Soft, non-tender, non-distended. There are no masses or hepatomegaly. There are normal bowel sounds.  EXTREMITIES: Warm and well  perfused with no cyanosis, clubbing.  LYMPHATIC: No axillary or supraclavicular lymphadenopathy.  NEUROLOGIC: Patient is oriented x3 with no focal or lateralizing neurologic deficits.  PSYCH: Patients affect is appropriate, there is no evidence of anxiety or depression.  SKIN: Warm and dry; no lesions or wounds.   DATA REVIEW  ECG: 09/01/22: normal sinus rhythm as per my personal interpretation  ECHO: 04/25/22: LVEF 65%-70%, moderately reduced RV function as per my personal interpretation  CATH:  06/26/22 1. Significant artifact throughout study due to implanted loop recorder  2. Small LV size, mild hypertrophy, and normal systolic function (EF 65%)  3.  Normal RV size and systolic function (EF 58%)  4. Basal septal midwall LGE, which is a scar pattern seen in nonischemic cardiomyopathies and associated with a worse prognosis  5. 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 She has mentioned switching to imuran, however, I do not believe treatment with imuran only will be sufficient for treatment of her diffuse sarcoid with cardiac involvement. Could consider transition to cellcept. Ultimately, she would benefit from dual therapy with methotrexate (agree with rheumatology to start methotrexate) or cellcept AND corticosteroids. Unfortunately she mentions becoming severely  depressed with nightmares while on steroids. We had a lengthy discussion about this. I would ultimately like to have her on at least prednisone 20mg . She is okay with taking 5mg  and slowly working the dose up.  - Plan on Ziopatch placement at follow up. If she has normal LVEF with no arrhythmias, her cardiac sarcoid is likely low risk and fairly indolent.  - I believe she would benefit from evaluation by Rheumatology locally or at a tertiary care center like [facility].   - Will communicate with her rheumatologist.  2. Pulmonary hypertension  - Likely due to a combination of HFpEF/OHS/sarcoid  - Will plan on RHC in the future. Averse to procedures.   3. OSA - Bipap + O2 at night.    Julie Cruz Advanced Heart Failure Mechanical Circulatory Support

## 2022-09-01 NOTE — Patient Instructions (Signed)
Medication Changes:  START: PREDNISONE 5mg  ONCE DAILY   Follow-Up in: 6 WEEKS AS SCHEDULED WITH Dr. Gasper Lloyd  At the [facility], you and your health needs are our priority. We have a designated team specialized in the treatment of Heart Failure. This Care Team includes your primary Heart Failure Specialized Cardiologist (physician), Advanced Practice Providers (APPs- Physician Assistants and Nurse Practitioners), and Pharmacist who all work together to provide you with the care you need, when you need it.   You may see any of the following providers on your designated Care Team at your next follow up:  Dr. Arvilla Meres Dr. Marca Ancona Dr. Marcos Eke, NP Robbie Lis, [place] [facility], [place] Brynda Peon, NP Karle Plumber, PharmD   Please be sure to bring in all your medications bottles to every appointment.   Need to Contact us:  If you have any questions or concerns before your next appointment please send [name] a message through [place] or call our office at [phone].    TO LEAVE A MESSAGE FOR THE NURSE SELECT OPTION 2, PLEASE LEAVE A MESSAGE INCLUDING: YOUR NAME DATE OF BIRTH CALL BACK NUMBER REASON FOR CALL**this is important as we prioritize the call backs  YOU WILL RECEIVE A CALL BACK THE SAME DAY AS LONG AS YOU CALL BEFORE 4:00 PM

## 2022-09-01 NOTE — Telephone Encounter (Signed)
Yes that is fine we can refer to rheumatology at [facility]

## 2022-09-05 ENCOUNTER — Ambulatory Visit: Payer: Medicare HMO | Admitting: Neurology

## 2022-09-05 ENCOUNTER — Telehealth (HOSPITAL_COMMUNITY): Payer: Self-pay | Admitting: Cardiology

## 2022-09-05 NOTE — Telephone Encounter (Signed)
Pt called triage to get update on plan of care.  Reports during OV Dr Gasper Lloyd spoke with Dr Clelia Croft (opthalmology)  and plan was to me made for care moving forward. Aware she started steroid however steroids do not agree with her   Request neuro referral

## 2022-09-06 ENCOUNTER — Telehealth: Payer: Self-pay | Admitting: Cardiology

## 2022-09-06 NOTE — Telephone Encounter (Signed)
Patient called and said that [facility] got her seeing a rheumatologist September 2025. Says that they need Dr. Bjorn Pippin to call to get in ASAP at [facility]. Patient said due to her situation, she cannot wait that long

## 2022-09-06 NOTE — Telephone Encounter (Signed)
Pt aware and voiced understanding 

## 2022-09-06 NOTE — Telephone Encounter (Signed)
Call to patient to verify [facility] clinic site and phone number. Patient does not have any specific contact or phone number, would like Dr. Bjorn Pippin to call and try to get her a sooner appointment at [facility], phone # [phone].

## 2022-09-07 NOTE — Telephone Encounter (Signed)
Spoke to patient she stated she has appointment with [facility] 10/2023.Stated Dr.Schumann needs to call [facility] to request a sooner appointment.Stated she already spoke to [employer] with our office today who will be speaking to Dr.Schumann when he is back in office.

## 2022-09-11 ENCOUNTER — Telehealth: Payer: Self-pay | Admitting: Cardiology

## 2022-09-11 NOTE — Telephone Encounter (Signed)
Patient states that [facility] has no appointments until September 2025.  She is asking  Tx for cardiac sarcoidosis.  She states they need Dr Bjorn Pippin to note that she needs an appt ASAP so she can get an appt sooner. She is asking for a letter or to call to them .   Did explain that I would send the message to provider and that he is not in office.  She gets upset and  states that she was told she would get a call today.  Advised I do not know about this information but again I would send to the provider, as he does answer at times when out of office, but that he is also in office tomorrow.  She gets angrier and vents for quite some time. She states "they are messing with the wrong person"  She states she is filing a complaint as this facility has given her the runaround.  She takes my name and ends the call.   Please advise regarding letter or call to [facility].

## 2022-09-11 NOTE — Telephone Encounter (Signed)
I had tried calling [facility], they said 10/2023 is next earliest appointment but they would put her on wait list for cancellation.  Spoke with patient, she said Dr Gasper Lloyd had recommended a rheumatologist in [place], would follow there

## 2022-09-11 NOTE — Telephone Encounter (Signed)
Pt calling to f/u on phone note from 7/10. Please advise.

## 2022-09-11 NOTE — Telephone Encounter (Signed)
I called the number, but they said they cannot move patients up, next available appointment in 10/2023 but they will keep her on wait list if something opens up.  I think more important than seeing rheumatology though is following with Advanced Heart Failure, as she saw Dr Gasper Lloyd last week and he will manage her medications for sarcoid.

## 2022-09-22 ENCOUNTER — Telehealth (HOSPITAL_COMMUNITY): Payer: Self-pay | Admitting: Cardiology

## 2022-09-22 DIAGNOSIS — D869 Sarcoidosis, unspecified: Secondary | ICD-10-CM

## 2022-09-22 NOTE — Telephone Encounter (Addendum)
Patient called to request referral for local rheumatology for methotrexate management.   Reports she was told at 7/9 office visit a LOCAL PROVIDER dr Gasper Lloyd had in mind.  Unable to see providers at [facility]    Reports she is also doubling on verapamil and lisinopril whenever she experiencing sarcoid chest wall pains. Reports doubling these medications and a pump from inhaler help with pains. Would like to increase quantity

## 2022-10-02 ENCOUNTER — Telehealth (HOSPITAL_COMMUNITY): Payer: Self-pay

## 2022-10-02 NOTE — Telephone Encounter (Signed)
Patient called asking if we received a surgical clearance form. Informed patient that it was received this morning. Form has been faxed to [facility] on 10/02/22 at [phone]  Patient was also asking about getting referred to a local rheumatologist as she stated she has never been under the care of [facility] and does not wish to go there. Informed patient I would speak with Dr. Gasper Lloyd when he is finished in clinic to get advice. Per Dr. Gasper Lloyd due to the complexity of her medical conditions his advice is to be seen at an academic center such as [facility] or [facility]

## 2022-10-03 ENCOUNTER — Other Ambulatory Visit (HOSPITAL_COMMUNITY): Payer: Self-pay | Admitting: Cardiology

## 2022-10-03 MED ORDER — VERAPAMIL HCL ER 360 MG PO CP24: 360.0000 mg | ORAL_CAPSULE | Freq: Every morning | ORAL | 3 refills | Status: DC

## 2022-10-03 NOTE — Telephone Encounter (Signed)
Pt aware to reach out to academic facility for rheumatology [place], [place], or [facility])

## 2022-10-06 NOTE — Addendum Note (Signed)
Addended by: , Milagros Reap on: 10/06/2022 04:23 PM   Modules accepted: Orders

## 2022-10-06 NOTE — Telephone Encounter (Signed)
Pt returned call to report a referral to [facility] is needed  Advised for insurance reasons (third party referrals)-referral may need to come from pcp or previous rheumatology   [facility] [phone]   Referral packet re faxed to correct fax # [phone]

## 2022-10-24 ENCOUNTER — Telehealth: Payer: Self-pay | Admitting: *Deleted

## 2022-10-24 ENCOUNTER — Other Ambulatory Visit: Payer: Self-pay | Admitting: General Surgery

## 2022-10-24 ENCOUNTER — Other Ambulatory Visit: Payer: Self-pay | Admitting: *Deleted

## 2022-10-24 DIAGNOSIS — Z86711 Personal history of pulmonary embolism: Secondary | ICD-10-CM | POA: Insufficient documentation

## 2022-10-24 DIAGNOSIS — Z86718 Personal history of other venous thrombosis and embolism: Secondary | ICD-10-CM | POA: Insufficient documentation

## 2022-10-24 DIAGNOSIS — C50511 Malignant neoplasm of lower-outer quadrant of right female breast: Secondary | ICD-10-CM

## 2022-10-24 NOTE — Telephone Encounter (Signed)
   Pre-operative Risk Assessment    Patient Name: Julie Cruz  DOB: Mar 23, 1965 MRN: [id number]      Request for Surgical Clearance    Procedure:   Right Breast Lumpectomy  Date of Surgery:  Clearance TBD                                 Surgeon:  Dr. Merlene Morse Group or Practice Name:  [facility] Phone number:  [phone] Fax number:  [phone]   Type of Clearance Requested:   - Medical  - Pharmacy:  Hold Apixaban (Eliquis) Not Indicated   Type of Anesthesia:  General    Additional requests/questions:    Signed, Emmit Pomfret   10/24/2022, 12:15 PM

## 2022-10-25 ENCOUNTER — Ambulatory Visit (HOSPITAL_COMMUNITY)
Admission: RE | Admit: 2022-10-25 | Discharge: 2022-10-25 | Disposition: A | Discharge status: Home / Self Care | Payer: Medicare HMO | Source: Ambulatory Visit | Attending: Cardiology | Admitting: Cardiology

## 2022-10-25 ENCOUNTER — Other Ambulatory Visit (HOSPITAL_COMMUNITY): Payer: Self-pay | Admitting: Cardiology

## 2022-10-25 ENCOUNTER — Inpatient Hospital Stay (HOSPITAL_COMMUNITY)
Admission: RE | Admit: 2022-10-25 | Discharge: 2022-10-25 | Disposition: A | Discharge status: Home / Self Care | Payer: Medicare HMO | Source: Ambulatory Visit | Attending: Cardiology | Admitting: Cardiology

## 2022-10-25 ENCOUNTER — Encounter (HOSPITAL_COMMUNITY): Payer: Self-pay | Admitting: Cardiology

## 2022-10-25 VITALS — BP 122/84 | HR 79 | Wt 323.0 lb

## 2022-10-25 DIAGNOSIS — I272 Pulmonary hypertension, unspecified: Secondary | ICD-10-CM | POA: Diagnosis not present

## 2022-10-25 DIAGNOSIS — D869 Sarcoidosis, unspecified: Secondary | ICD-10-CM | POA: Diagnosis not present

## 2022-10-25 DIAGNOSIS — I493 Ventricular premature depolarization: Secondary | ICD-10-CM

## 2022-10-25 DIAGNOSIS — G4733 Obstructive sleep apnea (adult) (pediatric): Secondary | ICD-10-CM | POA: Insufficient documentation

## 2022-10-25 DIAGNOSIS — I5022 Chronic systolic (congestive) heart failure: Secondary | ICD-10-CM | POA: Diagnosis present

## 2022-10-25 DIAGNOSIS — Z170 Estrogen receptor positive status [ER+]: Secondary | ICD-10-CM | POA: Insufficient documentation

## 2022-10-25 DIAGNOSIS — D8685 Sarcoid myocarditis: Secondary | ICD-10-CM

## 2022-10-25 DIAGNOSIS — D0511 Intraductal carcinoma in situ of right breast: Secondary | ICD-10-CM | POA: Diagnosis not present

## 2022-10-25 LAB — BASIC METABOLIC PANEL
Anion gap: 16 — ABNORMAL HIGH (ref 5–15)
BUN: 11 mg/dL (ref 6–20)
CO2: 22 mmol/L (ref 22–32)
Calcium: 8.9 mg/dL (ref 8.9–10.3)
Chloride: 100 mmol/L (ref 98–111)
Creatinine, Ser: 1.1 mg/dL — ABNORMAL HIGH (ref 0.44–1.00)
GFR, Estimated: 59 mL/min — ABNORMAL LOW (ref >60)
Glucose, Bld: 85 mg/dL (ref 70–99)
Potassium: 3.6 mmol/L (ref 3.5–5.1)
Sodium: 138 mmol/L (ref 135–145)

## 2022-10-25 LAB — BRAIN NATRIURETIC PEPTIDE: B Natriuretic Peptide: 15.8 pg/mL (ref 0.0–100.0)

## 2022-10-25 NOTE — Progress Notes (Signed)
ADVANCED HEART FAILURE CLINIC NOTE  Referring Physician: Leilani Able, MD  Primary Care: Julie Able, MD Primary Cardiologist: Dr. Gasper Cruz  HPI: Julie Cruz is a 57 y.o. female with with pulmonary hypertension, hypertension, fibromyalgia and pulmonary and cardiac sarcoid presenting today to establish care.  Based on chart review it appears that she was initially diagnosed with sarcoid in 2007 at [facility] via endobronchial biopsy.  In September 2018 she had a cardiac MRI with normal LV/RV function but linear area of intramyocardial LGE at the septum.  She had a PET scan in 2020 that showed no cardiac uptake. Admitted in 2/24 for SOB w/ CTPE demonstrating B/L PE; noted to have LLE DVT. TTE at that [facility] with LVEF of 65%, moderate RV dysfunction. Cardiac PET in 4/24 with FDG uptake in the basal inferior/inferolateral walls consistent with LGE pattern on CMR.   Julie Cruz reports that she is very limited due to diffuse pain and fatigue.  She is intolerant to any steroids, reports that she has severe depression and that this depression is exacerbated by any steroid use.  Interval hx Since our last visit she has had a diagnostic mammogram with right breast DCIS. 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 She will require surgery and XRT. She reports that excision is pending surgical clearance from a heart standpoint. Otherwise, from a cardiac sarcoid standpoint, she continues to take MTX as prescribed by her previous rheumatologist. She has an upcoming appt with rheumatology here in [place]. 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   Past Medical History:  Diagnosis Date   Acute medial meniscus tear    Anxiety    Bipolar 1 disorder (HCC)    Depression    Fibromyalgia    H/O spinal cord injury    T, L ans CSpine   Hypertension    Menopause    Pulmonary hypertension (HCC)    Sarcoid, cardiac     Current Outpatient  Medications  Medication Sig Dispense Refill   acetaminophen (TYLENOL) 650 MG CR tablet Take 650 mg by mouth every 8 (eight) hours as needed for pain.     apixaban (ELIQUIS) 5 MG TABS tablet Take 1 tablet (5 mg total) by mouth 2 (two) times daily. 180 tablet 3   clonazePAM (KLONOPIN) 0.5 MG tablet Take 0.5 mg by mouth daily.     cyclobenzaprine (FLEXERIL) 10 MG tablet Take 10-20 mg by mouth daily as needed for muscle spasms.     Difluprednate 0.05 % EMUL Apply 1 drop to eye 3 (three) times daily. Into the right eye.     dorzolamide (TRUSOPT) 2 % ophthalmic solution Place 1 drop into the right eye 3 (three) times daily.     folic acid (FOLVITE) 1 MG tablet Take 1 tablet by mouth daily.     hydrochlorothiazide (HYDRODIURIL) 50 MG tablet Take 50 mg by mouth in the morning.     ipratropium-albuterol (DUONEB) 0.5-2.5 (3) MG/3ML SOLN Take 3 mLs by nebulization every 6 (six) hours as needed. 1080 mL 3   lisinopril (ZESTRIL) 40 MG tablet Take 1 tablet (40 mg total) by mouth in the morning. 90 tablet 3   LUMIGAN 0.01 % SOLN Place 1 drop into both eyes at bedtime.     albuterol (VENTOLIN HFA) 108 (90 Base) MCG/ACT inhaler Inhale 2 puffs into the lungs every 6 (six) hours as needed for wheezing or shortness of breath. 18 g 3  methotrexate (RHEUMATREX) 2.5 MG tablet Take 2.5 mg by mouth once a week. Caution:Chemotherapy. Protect from light. 10 tablets Take only on Tuesday's 2 tablets  (5 mg) in the morning 2 tablets (5 mg) at night     oxyCODONE-acetaminophen (PERCOCET) 10-325 MG tablet Take 1 tablet by mouth 3 (three) times daily.     pantoprazole (PROTONIX) 40 MG tablet Take 40 mg by mouth in the morning.     potassium chloride (KLOR-CON) 10 MEQ tablet Take 20 mEq by mouth in the morning.     sertraline (ZOLOFT) 100 MG tablet Take 200 mg by mouth in the morning.     traZODone (DESYREL) 50 MG tablet Take 50 mg by mouth at bedtime.     verapamil (VERELAN) 360 MG 24 hr capsule Take 1 capsule (360 mg total) by  mouth in the morning. 90 capsule 3   No current facility-administered medications for this encounter.    No Known Allergies    Social History   Socioeconomic History   Marital status: Single    Spouse name: Not on file   Number of children: Not on file   Years of education: Not on file   Highest education level: Not on file  Occupational History   Not on file  Tobacco Use   Smoking status: Former    Current packs/day: 0.00    Types: Cigarettes    Quit date: 2007    Years since quitting: 17.6   Smokeless tobacco: Never  Vaping Use   Vaping status: Never Used  Substance and Sexual Activity   Alcohol use: Yes    Alcohol/week: 2.0 standard drinks of alcohol    Types: 2 Glasses of wine per week    Comment: Drinks wine occasional   Drug use: Never   Sexual activity: Not Currently  Other Topics Concern   Not on file  Social History Narrative   Right Handed    Lives in a one story home - [place]    Social Determinants of Health   Financial Resource Strain: Not on file  Food Insecurity: Not on file  Transportation Needs: Not on file  Physical Activity: Not on file  Stress: Not on file  Social Connections: Unknown (12/27/2021)   Received from [facility], [facility]   Social Network    Social Network: Not on file  Intimate Partner Violence: Unknown (12/27/2021)   Received from [employer], [facility]   HITS    Physically Hurt: Not on file    Insult or Talk Down To: Not on file    Threaten Physical Harm: Not on file    Scream or Curse: Not on file      Family History  Problem Relation Age of Onset   Hyperlipidemia Mother    Asthma Mother    Thyroid disease Mother    Depression Mother    Rheum arthritis Mother    Cancer Father        Pancreatic Cancer   Diabetes Father    Heart attack Brother    Depression Brother    HIV/AIDS Brother     PHYSICAL EXAM: Vitals:   10/25/22 1534  BP: 122/84  Pulse: 79  SpO2: 96%   GENERAL: Well  nourished, well developed, and in no apparent distress at rest.  HEENT: Negative for arcus senilis or xanthelasma. There is no scleral icterus.  The mucous membranes are pink and moist.   NECK: Supple, No masses. Normal carotid upstrokes without bruits. No masses or  thyromegaly.    CHEST: There are no chest wall deformities. There is no chest wall tenderness. Respirations are unlabored.  Lungs- CTA B/L CARDIAC:  JVP: 7 cm          Normal rate with regular rhythm. No murmurs, rubs or gallops.  Pulses are 2+ and symmetrical in upper and lower extremities. No edema.  ABDOMEN: Soft, non-tender, non-distended. There are no masses or hepatomegaly. There are normal bowel sounds.  EXTREMITIES: Warm and well perfused with no cyanosis, clubbing.  LYMPHATIC: No axillary or supraclavicular lymphadenopathy.  NEUROLOGIC: Patient is oriented x3 with no focal or lateralizing neurologic deficits.  PSYCH: Patients affect is appropriate, there is no evidence of anxiety or depression.  SKIN: Warm and dry; no lesions or wounds.    DATA REVIEW  ECG: 09/01/22: normal sinus rhythm as per my personal interpretation  ECHO: 04/25/22: LVEF 65%-70%, moderately reduced RV function as per my personal interpretation  CATH:  06/26/22 1. Significant artifact throughout study due to implanted loop recorder  2. Small LV size, mild hypertrophy, and normal systolic function (EF 65%)  3.  Normal RV size and systolic function (EF 58%)  4. Basal septal midwall LGE, which is a scar pattern seen in nonischemic cardiomyopathies and associated with a worse prognosis  5. RV insertion site LGE, which is a nonspecific scar pattern often seen in setting of elevated pulmonary pressures   ASSESSMENT & PLAN:  Evaluation for cardiac sarcoid  - Diagnosed with sarcoid in 2007 at [facility] via endobronchial biopsy - Cardiac PET in 4/24 with FDG uptake in the basal inferior/inferolateral walls consistent with LGE pattern on CMR - Her  case is fairly complex; she reports having some degree of uveitis from sarcoid in addition to diffuse arthralgias. She has mentioned switching to imuran, however, I do not believe treatment with imuran only will be sufficient for treatment of her diffuse sarcoid with cardiac involvement. Could consider transition to cellcept. Ultimately, she would benefit from dual therapy with methotrexate (agree with rheumatology to start methotrexate) or cellcept AND corticosteroids. Unfortunately she mentions becoming severely depressed with nightmares while on steroids. We had a lengthy discussion about this.  - Previously discussed started low dose prednisone; she has not started it at this time. Will continue to hold until she sees rheumatology.  - Planning to see rheumatology in 1 week. At this time, although, she has active inflammation in the myocardium by PET, her cardiac sarcoid is fairly indolent with no clinical manifestations. Will place ziopatch to assess for arrythmias. Due to the severity of her sarcoid (eye involvement) and her inability to tolerate steroids, I will defer management to rheumatology. May require TNF-alpha inhibitor?   2. Pulmonary hypertension  - Likely due to a combination of HFpEF/OHS/sarcoid  - Will plan on RHC in the future. Averse to procedures.   3. OSA - Bipap + O2 at night.   4. 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  - From a cardiac standpoint, she is low risk to proceed with excision.    Kaziah Krizek Advanced Heart Failure Mechanical Circulatory Support

## 2022-10-25 NOTE — Progress Notes (Signed)
Zio patch placed onto patient.  All instructions and information reviewed with patient, they verbalize understanding with no questions. 

## 2022-10-25 NOTE — Telephone Encounter (Signed)
Routed to [facility] surgery to make them aware that pt has appointment 11/02/22, clearance will be addressed at that time.

## 2022-10-25 NOTE — Telephone Encounter (Signed)
   Name: Julie Cruz  DOB: 1965/07/15  MRN: [id number]  Primary Cardiologist: Little Ishikawa, MD  Chart reviewed as part of pre-operative protocol coverage. Because of [name]'s past medical history and time since last visit, she will require a follow-up in-office visit in order to better assess preoperative cardiovascular risk.  Patient has an office visit scheduled with Dr. Bjorn Pippin on 11/02/2022. Appointment notes have been updated to reflect need for pre-op evaluation.   Pre-op covering staff:  - Please contact requesting surgeon's office via preferred method (i.e, phone, fax) to inform them of need for appointment prior to surgery.  Eliquis prescribed by a noncardiology provider (pulmonary) therefore recommendations for holding deferred to prescribing provider.    Carlos Levering, NP  10/25/2022, 8:50 AM

## 2022-10-25 NOTE — Patient Instructions (Addendum)
Good to see you today!  Medication Changes:  None, continue current medications  Lab Work:  Labs done today, your results will be available in MyChart, we will contact you for abnormal readings.  Testing/Procedures:  Your provider has recommended that  you wear a Zio Patch for 14 days.  This monitor will record your heart rhythm for our review.  IF you have any symptoms while wearing the monitor please press the button.  If you have any issues with the patch or you notice a red or orange light on it please call the company at [phone].  Once you remove the patch please mail it back to the company as soon as possible so we can get the results.  Referrals:  none  Special Instructions // Education:  Do the following things EVERYDAY: Weigh yourself in the morning before breakfast. Write it down and keep it in a log. Take your medicines as prescribed Eat low salt foods--Limit salt (sodium) to 2000 mg per day.  Stay as active as you can everyday Limit all fluids for the day to less than 2 liters  We have provided you a letter for surgical clearance  Follow-Up in: 4 months  At the [facility], you and your health needs are our priority. We have a designated team specialized in the treatment of Heart Failure. This Care Team includes your primary Heart Failure Specialized Cardiologist (physician), Advanced Practice Providers (APPs- Physician Assistants and Nurse Practitioners), and Pharmacist who all work together to provide you with the care you need, when you need it.   You may see any of the following providers on your designated Care Team at your next follow up:  Dr. Arvilla Meres Dr. Marca Ancona Dr. Marcos Eke, NP Robbie Lis, [place] [facility], [place] Brynda Peon, NP Karle Plumber, PharmD   Please be sure to bring in all your medications bottles to every appointment.   Need to Contact us:  If you have any  questions or concerns before your next appointment please send [name] a message through [place] or call our office at [phone].    TO LEAVE A MESSAGE FOR THE NURSE SELECT OPTION 2, PLEASE LEAVE A MESSAGE INCLUDING: YOUR NAME DATE OF BIRTH CALL BACK NUMBER REASON FOR CALL**this is important as we prioritize the call backs  YOU WILL RECEIVE A CALL BACK THE SAME DAY AS LONG AS YOU CALL BEFORE 4:00 PM       If you have any questions or concerns before your next appointment please send [name] a message through [place] or call our office at [phone].    TO LEAVE A MESSAGE FOR THE NURSE SELECT OPTION 2, PLEASE LEAVE A MESSAGE INCLUDING: YOUR NAME DATE OF BIRTH CALL BACK NUMBER REASON FOR CALL**this is important as we prioritize the call backs  YOU WILL RECEIVE A CALL BACK THE SAME DAY AS LONG AS YOU CALL BEFORE 4:00 PM  At the [facility], you and your health needs are our priority. As part of our continuing mission to provide you with exceptional heart care, we have created designated Provider Care Teams. These Care Teams include your primary Cardiologist (physician) and Advanced Practice Providers (APPs- Physician Assistants and Nurse Practitioners) who all work together to provide you with the care you need, when you need it.   You may see any of the following providers on your designated Care Team at your next follow up: Dr Arvilla Meres Dr Marca Ancona Dr.  Aditya Sabharwal Tonye Becket, NP Robbie Lis, PA [facility], [place] Brynda Peon, NP Karle Plumber, PharmD   Please be sure to bring in all your medications bottles to every appointment.    Thank you for choosing [facility]

## 2022-10-26 ENCOUNTER — Telehealth: Payer: Self-pay | Admitting: Radiation Oncology

## 2022-10-26 ENCOUNTER — Other Ambulatory Visit: Payer: Self-pay | Admitting: Radiation Oncology

## 2022-10-26 ENCOUNTER — Inpatient Hospital Stay
Admission: RE | Admit: 2022-10-26 | Discharge: 2022-10-26 | Disposition: A | Discharge status: Home / Self Care | Payer: Self-pay | Source: Ambulatory Visit | Attending: Radiation Oncology | Admitting: Radiation Oncology

## 2022-10-26 ENCOUNTER — Other Ambulatory Visit: Payer: Self-pay | Admitting: *Deleted

## 2022-10-26 DIAGNOSIS — D0511 Intraductal carcinoma in situ of right breast: Secondary | ICD-10-CM

## 2022-10-26 NOTE — Telephone Encounter (Signed)
8/29 11:35 am Left voicemail for [facility] need breast images to be uploaded to PACs in epic.  Waiting on images to be attached to patient's timeline.

## 2022-10-26 NOTE — Telephone Encounter (Signed)
8/29 @ 10:42 am Left voicemail for patient to call our office to be schedule for consult.

## 2022-10-26 NOTE — Telephone Encounter (Signed)
8/29 sent via stat fax request for breast images to be push to powershare from [facility].  F/U call to [facility] imaging dept spoke to [place], will push images to powershare.  Waiting on images.

## 2022-10-27 NOTE — Progress Notes (Addendum)
Location of Breast Cancer: Ductal carcinoma in situ (DCIS) of right breast   Histology per Pathology Report:  Pathology core needle biopsy: 10/12/2022 [facility], right, lower outer quadrant, stereotactic needle core biopsy: -Ductal carcinoma in situ, grade 2, with comedonecrosis and microcalcifications  Receptors: Estrogen Receptor (ER) Status: Positive (greater than 10% of cells demonstrate nuclear positivity)  Percentage of Cells with Nuclear Positivity: 100 %  Average Intensity of Staining: Strong    Receptor Status: ER(100% ), PR (not reported), Her2-neu (), Ki-67()  Did patient present with symptoms (if so, please note symptoms) or was this found on screening mammography?: screening mammogram  Past/Anticipated interventions by surgeon, if any: Dr. Donell Beers 10-24-22  History of Present Illness: Julie Cruz is a 57 y.o. female who is seen today as an office consultation at the request of Dr. Gardiner Ramus for evaluation of New Consultation (BREAST) .   Patient presents with a new diagnosis of right breast cancer August 2024. She was found to have a screening detected calcifications. She subsequently underwent diagnostic mammography which showed group of microcalcifications measuring 2.6 cm in greatest dimension. There was subtle asymmetry. A core needle biopsy was performed. 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 Estrogen receptor positivity was 100% with strong intensity. PR was not reported.  With regard to family cancer history, her father has had pancreatic cancer. She had some more distant relatives with history of breast cancer. She is quite anxious to have surgery.  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 She is currently on methotrexate for this. She has tried steroids which severely exacerbated her depression. She currently is awaiting an appointment with  rheumatology to discuss dual treatment with an additional immunosuppressive medication. Patient has had some heart failure but has preserved systolic function.  She does well from a pulmonary standpoint overall. She states that her eyes are relatively stable at this point.  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   Family cancer history - father pancreatic cancer   Assessment and Plan:   ICD-10-CM  1. Malignant neoplasm of lower-outer quadrant of right breast of female, estrogen receptor positive (CMS/HHS-HCC) C50.511 Ambulatory Referral to Oncology-Medical  Z17.0 Ambulatory Referral to Radiation Oncology  Ambulatory Referral to Cancer Genetics - [id number]   2. Cardiac sarcoidosis (HHS-HCC) D86.85   3. Fibromyalgia M79.7   4. Pulmonary hypertension (CMS/HHS-HCC) I27.20   5. Bipolar 1 disorder (CMS/HHS-HCC) F31.9   6. Current use of long term anticoagulation Z79.01   7. History of pulmonary embolism Z86.711   8. OSA (obstructive sleep apnea) G47.33   9. Chronic systolic heart failure (CMS/HHS-HCC) I50.22    Patient has a new diagnosis of stage 0 right lung cancer, hormone receptor positive. I discussed possible treatment options. Surgical options would definitely require holding methotrexate due to wound healing issues. There also may be an option of trying antihormonal treatment and holding off on surgery with interval breast imaging. The patient desires to have surgery if that is possible. We will send clearance request to cardiology, pulmonology, and ophthalmology. I would like to make sure that there are not severe consequences for holding her methotrexate. If she were felt to be at life-threatening risk to be off of her immunosuppressants for 3 to 4 weeks, I would definitely lean towards a nonoperative option.  Assuming we are able to be at low to medium risk for surgery, I would plan to do a seed  localized lumpectomy. I discussed this with the patient. I reviewed  that with her age this is typically followed by radiation and antihormonal treatment. I did discuss mastectomy, but that surgical risk is much higher and would require much longer time off methotrexate. It would also require longer off of her Eliquis which would increase her clotting risk. Surgery would be much longer to have unilateral or bilateral mastectomy. Her risk of developing invasive cancer or metastatic cancer is low given her level of disease. Given her higher risk with her medical history, I recommend against mastectomy.  Past/Anticipated interventions by medical oncology, if any: Pt to see Dr. Pamelia Hoit on 11-08-22.   Lymphedema issues, if any:  not applicable  Pain issues, if any:  pain of 7 in spine and overall (chronic pain that she lives with everyday,   SAFETY ISSUES: Prior radiation? no Pacemaker/ICD? Yes, loop recorder but chip does not work  Possible current pregnancy?no Is the patient on methotrexate? Yes,   Current Complaints / other details:  Pt has strong anxiety and chronic pain. She has many other cardiac and lung issues. Wants to know plan overall. No surgical date. She is getting clearance from pulmonary and cardiac.

## 2022-10-30 NOTE — Progress Notes (Deleted)
Cardiology Office Note:    Date:  10/30/2022   ID:  Julie Cruz, DOB 11/26/1965, MRN [id number]  PCP:  Leilani Able, MD  Cardiologist:  Little Ishikawa, MD  Electrophysiologist:  None   Referring MD: Leilani Able, MD   No chief complaint on file.   History of Present Illness:    Julie Cruz is a 57 y.o. female with a hx of sarcoidosis with cardiac involvement, PE, pulmonary hypertension, hypertension, fibromyalgia who presents for follow-up.  She was referred by Dr. Elberta Fortis for evaluation of cardiac sarcoid, initially seen on 05/09/2022.  Previously followed at [facility].  Diagnosed with sarcoid in 2007 via endobronchial biopsy.  Cardiac MRI 10/2016 showed normal biventricular size and systolic function, mid septal linear intramyocardial delayed enhancement.  Had PET scan in 2020 that showed no cardiac activity (prior study had shown mild uptake).  LHC in 2019 showed no obstructive CAD.  She had an episode of syncope and had loop implanted.  Device reached RRT, she elected not to have it explanted.  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  She has been on methotrexate by ophthalmology.  She was admitted 03/2022 with worsening shortness of breath.  CTPA showed bilateral PE and lower extremity duplex showed left lower extremity DVT.  Echo showed EF 65 to 70%, moderate RV dysfunction with mild RV enlargement.  She was started on heparin drip and transition to Eliquis on discharge.  States that she was having lightheadedness, chest pain, and shortness of breath.  All symptoms have improved since starting treatment for her PE.  She smoked 1 pack/week for over 20 years, quit when diagnosed with sarcoid in 2007.  No family history of cardiac disease.  Zio patch x 14 days 01/2022 showed no significant arrhythmias.  Echocardiogram 03/2022 showed EF 65 to 70%, mild LVH, normal diastolic function, moderate RV dysfunction.  Cardiac PET scan 06/20/2022 showed basal  inferior/inferolateral FDG uptake consistent with myocardial inflammation.  Cardiac MRI 06/30/2022 showed significant artifact throughout the study due to implanted loop recorder, LVEF 65%, RVEF 58%, basal septal mid wall LGE and RV insertion site LGE.  Since last clinic visit,  she reports that she is doing okay.  She continues to have palpitations of stress.  She denies any chest pain.  Reports stable shortness of breath.  She has been taking Eliquis.  Past Medical History:  Diagnosis Date   Acute medial meniscus tear    Anxiety    Bipolar 1 disorder (HCC)    Depression    Fibromyalgia    H/O spinal cord injury    T, L ans CSpine   Hypertension    Menopause    Pulmonary hypertension (HCC)    Sarcoid, cardiac     Past Surgical History:  Procedure Laterality Date   C Section     Catherization      x2   COLONOSCOPY WITH PROPOFOL N/A 10/15/2020   Procedure: COLONOSCOPY WITH PROPOFOL;  Surgeon: Jeani Hawking, MD;  Location: [facility];  Service: Endoscopy;  Laterality: N/A;   Etopic  pregnacy     LUNG BIOPSY     TUBAL LIGATION      Current Medications: No outpatient medications have been marked as taking for the 11/02/22 encounter (Appointment) with Little Ishikawa, MD.     Allergies:   Patient has no known allergies.   Social History   Socioeconomic History   Marital status: Single    Spouse name: Not on file  Number of children: Not on file   Years of education: Not on file   Highest education level: Not on file  Occupational History   Not on file  Tobacco Use   Smoking status: Former    Current packs/day: 0.00    Types: Cigarettes    Quit date: 2007    Years since quitting: 17.6   Smokeless tobacco: Never  Vaping Use   Vaping status: Never Used  Substance and Sexual Activity   Alcohol use: Yes    Alcohol/week: 2.0 standard drinks of alcohol    Types: 2 Glasses of wine per week    Comment: Drinks wine occasional   Drug use: Never   Sexual activity:  Not Currently  Other Topics Concern   Not on file  Social History Narrative   Right Handed    Lives in a one story home - [place]    Social Determinants of Health   Financial Resource Strain: Not on file  Food Insecurity: Not on file  Transportation Needs: Not on file  Physical Activity: Not on file  Stress: Not on file  Social Connections: Unknown (12/27/2021)   Received from [employer], [facility]   Social Network    Social Network: Not on file     Family History: The patient's family history includes Asthma in her mother; Cancer in her father; Depression in her brother and mother; Diabetes in her father; HIV/AIDS in her brother; Heart attack in her brother; Hyperlipidemia in her mother; Rheum arthritis in her mother; Thyroid disease in her mother.  ROS:   Please see the history of present illness.     All other systems reviewed and are negative.  EKGs/Labs/Other Studies Reviewed:    The following studies were reviewed today:   EKG:   05/09/22: NSR, rate 94, nonspecific T wave flattening, QTC 492 08/14/2022: Normal sinus rhythm, rate 72, QTc 466, nonspecific T wave flattening  Recent Labs: 08/14/2022: ALT 24; Hemoglobin 12.3; Magnesium 2.1; Platelets 162 10/25/2022: B Natriuretic Peptide 15.8; BUN 11; Creatinine, Ser 1.10; Potassium 3.6; Sodium 138  Recent Lipid Panel No results found for: "CHOL", "TRIG", "HDL", "CHOLHDL", "VLDL", "LDLCALC", "LDLDIRECT"  Physical Exam:    VS:  There were no vitals taken for this visit.    Wt Readings from Last 3 Encounters:  10/25/22 (!) 323 lb (146.5 kg)  09/01/22 (!) 329 lb (149.2 kg)  08/14/22 (!) 330 lb 8 oz (149.9 kg)     GEN:  Well nourished, well developed in no acute distress HEENT: Normal NECK: No JVD; No carotid bruits LYMPHATICS: No lymphadenopathy CARDIAC: RRR, no murmurs, rubs, gallops RESPIRATORY:  Clear to auscultation without rales, wheezing or rhonchi  ABDOMEN: Soft, non-tender,  non-distended MUSCULOSKELETAL:  No edema; No deformity  SKIN: Warm and dry NEUROLOGIC:  Alert and oriented x 3 PSYCHIATRIC:  Normal affect   ASSESSMENT:    No diagnosis found.  PLAN:    Preop evaluation:  Sarcoidosis: Diagnosed with sarcoid in 2007 via endobronchial biopsy.  Cardiac MRI 10/2016 showed normal biventricular size and systolic function, mid septal linear intramyocardial delayed enhancement.  Had PET scan in 2020 that showed no cardiac activity (prior study had shown mild uptake).  LHC in 2019 showed no obstructive CAD.  She had an episode of syncope and had loop implanted.  No significant arrhythmias seen, device reached RRT, she elected not to have it explanted.  She underwent eye surgery on 03/08/22 due to uveitis.  She has been on methotrexate by ophthalmology.  Echocardiogram 03/2022 showed EF 65 to 70%, mild LVH, normal diastolic function, moderate RV dysfunction.  Cardiac PET scan 06/20/2022 showed basal inferior/inferolateral FDG uptake consistent with myocardial inflammation.  Cardiac MRI 06/30/2022 showed significant artifact throughout the study due to implanted loop recorder, LVEF 65%, RVEF 58%, basal septal mid wall LGE and RV insertion site LGE. -Follows with rheumatology and ophthalmology.  On eye exam 06/16/2022, noted to have inflammation and Dr. Sherryll Burger recommended stopping methotrexate and starting azathioprine.  She has not started this yet, planning to start tomorrow. -Plan repeat cardiac PET in 6 months to evaluate for improvement in myocardial inflammation with starting azathioprine -Refer to advanced heart failure clinic for cardiac sarcoidosis, follows with Dr. Gasper Lloyd  Acute PE: Diagnosed during admission 03/2022.  On Eliquis 5 mg twice daily.  Moderate RV dysfunction on echo 03/2022.  CMR 06/2022 showed normal RV systolic function  Palpitations: Zio patch x 14 days 01/2022 showed no significant arrhythmias.  Reports palpitations have improved  Hypertension: on HCTZ  25 mg daily and lisinopril 40 mg daily and verapamil 360 mg daily.  Check BMET  Prolonged QTc: Has had low potassium/magnesium, likely due to HCTZ use.  HCTZ dose decreased at last clinic visit.  Check BMET/magnesium  RTC in 3 months***  Medication Adjustments/Labs and Tests Ordered: Current medicines are reviewed at length with the patient today.  Concerns regarding medicines are outlined above.  No orders of the defined types were placed in this encounter.  No orders of the defined types were placed in this encounter.   There are no Patient Instructions on file for this visit.   Signed, Little Ishikawa, MD  10/30/2022 9:42 PM    [facility]

## 2022-10-31 NOTE — Telephone Encounter (Signed)
Pt cancelled 09/05 appt, states she has dentist appt that day at 3:25pm and that Dr. Gasper Lloyd has already cleared her for surgery. She is calling Dr. Nyoka Cowden office now to get clarification.

## 2022-10-31 NOTE — Telephone Encounter (Addendum)
   Patient Name: Julie Cruz  DOB: 04-10-1965 MRN: [id number]  Primary Cardiologist: Little Ishikawa, MD  Chart reviewed as part of pre-operative protocol coverage. Given past medical history and time since last visit, based on ACC/AHA guidelines, Julie Cruz is at acceptable risk for the planned procedure without further cardiovascular testing.   Patient was last seen in the office on 10/25/2022 by Dr.Sabharwal. She was cleared for surgery at the time.   Eliquis prescribed by a noncardiology provider (pulmonary) therefore recommendations for holding deferred to prescribing provider.     I will route this recommendation as well has the note from her most recent OV to the requesting party via Epic fax function and remove from pre-op pool.  Please call with questions.  Joylene Grapes, NP 10/31/2022, 2:55 PM

## 2022-10-31 NOTE — Telephone Encounter (Signed)
Will forward to pre op for review if pt has been cleared, see notes per pt states she has been cleared.

## 2022-11-01 ENCOUNTER — Ambulatory Visit: Payer: Medicare HMO | Admitting: Pulmonary Disease

## 2022-11-01 ENCOUNTER — Telehealth: Payer: Self-pay | Admitting: Radiation Oncology

## 2022-11-01 ENCOUNTER — Encounter: Payer: Self-pay | Admitting: Pulmonary Disease

## 2022-11-01 ENCOUNTER — Telehealth: Payer: Self-pay

## 2022-11-01 VITALS — BP 120/70 | HR 70 | Temp 99.0°F | Ht 71.0 in | Wt 323.0 lb

## 2022-11-01 DIAGNOSIS — G4733 Obstructive sleep apnea (adult) (pediatric): Secondary | ICD-10-CM

## 2022-11-01 DIAGNOSIS — D869 Sarcoidosis, unspecified: Secondary | ICD-10-CM | POA: Diagnosis not present

## 2022-11-01 DIAGNOSIS — G4734 Idiopathic sleep related nonobstructive alveolar hypoventilation: Secondary | ICD-10-CM

## 2022-11-01 NOTE — Telephone Encounter (Signed)
ATC X1 LVM for patient. Received surgical clearance request from [facility]. They have emailed me requesting a sooner apt for patient. R. Dewald had an opening today at 3:30pm. If patient isnt available she will need to keep scheduled apt on 9/23

## 2022-11-01 NOTE — Telephone Encounter (Signed)
Patient confirmed she will make it to the appointment this afternoon at 3:30pm with Dr. Francine Graven.

## 2022-11-01 NOTE — Patient Instructions (Addendum)
ARISCAT Score for Postoperative Pulmonary Complications Low risk 1.6%  to moderate risk 13.3% risk of in-hospital post-op pulmonary complications (composite including respiratory failure, respiratory infection, pleural effusion, atelectasis, pneumothorax, bronchospasm, aspiration pneumonitis).  Recommend bringing your bipap to the hospital if you will be staying the night after your surgery  Use albuterol inhaler 1-2 puffs or duonebs every 4-6 hours as needed  Talk with your surgeon if they will be providing you a lovenox bridge to come off the eliquis safely for your surgery or if they would like our team to organize that with our pharmacy team.   Continue bipap with 2L of oxygen at night  Follow up in 4 months

## 2022-11-01 NOTE — Telephone Encounter (Signed)
9/4 @ 10:18 am follow-up call to canopy no answer left voicemail for recent breast images to be uploaded to PACs in epic.  Waiting on images.

## 2022-11-01 NOTE — Progress Notes (Signed)
f  Synopsis: Referred in August 2022 for Sarcoidosis   Subjective:   PATIENT ID: Julie Cruz GENDER: female DOB: [birth year], MRN: [id number]  HPI  Chief Complaint  Patient presents with   Follow-up    Surgical clearance.  Breast surgery not scheduled yet.   Julie Cruz is a 57 year old woman, former smoker with hypertension, pulmonary hypertension, fibromyalgia, uveitis and sarcoidosis who returns to pulmonary clinic for follow up of sarcoidosis.  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 No issues with her breathing recently. She continues on bipap at night. (BiPAP: Max IPAP = 21     Min EPAP = 17     Pressure Support = 4  With 2L O2. )  OV 05/30/22 She was recently hospitalized 2/27 to 2/28 with acute hypoxemic respiratory failure and pulmonary emboli/DVT.   She was seen by cardiolgy 05/09/22, note reviewed, with plans for cardiac MRI and PET scans.   She is having episodes of numbness/weakness symptoms of her extremities.   She remains on methotrexate for her uveitis.   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   She has started bipap therapy and has been compliant based on her recent download.   She was seen in the ER 5/17 and 5/20 for dizziness and near syncope episodes. Evaluations were unrevealing and she reports no issues since those visits.   She currently has cold like symptoms with runny nose and nasal congestion.   OV 04/12/21 She has not started her symbicort inhaler as she is concerned about the side effects. She is using albuterol 2 to 4 times per day for shortness of breath, cough or wheezing. She recently had ophthalmology evaluation and they  did not note inflammatory involvement related to sarcoidosis. She does have vision loss in her right eye due to retina issues and she is also being treated for glaucoma.   She was contacted by a nutritionist after last visit but this is not covered by her insurance. She has been referred to the weight loss clinis.  OV 02/24/21 She tested positive for covid 19 on 12/13 and was placed on 12 day prednisone taper on 12/21 for her symptoms of cough and dyspnea. She is much improved at this time. She is using albuterol as needed for her wheezing or dyspnea and using it on a daily basis. She has not started the Symbicort inhaler that was sent in for her after the HRCT Chest was performed showing small airways disease.   PFTs from 10/22 show a restrictive defect with normal diffusion capacity. She is scheduled for split night sleep study 03/15/20. She has ophthalmology visit next week.   She brought in papers from [place]. [facility] where she was prescribed methotrexate for sarcoidosis, uveitis and polyarthropathy. She was seen by ophthalmology and rheumatology.   OV 10/06/20 She reports being diagnosed with sarcoidosis at age 22 via lung biopsy in 2007 at [facility] in [place].  She was initially on prednisone for treatment but then due to side effects has stopped this therapy and has been weary of using methotrexate so she has not required systemic therapy over the last few years.  She denies progressive dyspnea since deciding not to have systemic  treatment of her sarcoidosis. She is currently using albuterol twice daily for shortness of breath.  She was previously on Advair but did not like this inhaler.   She has history of obstructive sleep apnea and has a CPAP machine but has not used it in months as it is one of the machines included in the recent recall.  She has gained 20 to 40 pounds over the last couple of years.  Past Medical History:  Diagnosis Date   Acute medial meniscus tear    Anxiety     Bipolar 1 disorder (HCC)    Depression    Fibromyalgia    H/O spinal cord injury    T, L ans CSpine   Hypertension    Menopause    Pulmonary hypertension (HCC)    Sarcoid, cardiac      Family History  Problem Relation Age of Onset   Hyperlipidemia Mother    Asthma Mother    Thyroid disease Mother    Depression Mother    Rheum arthritis Mother    Cancer Father        Pancreatic Cancer   Diabetes Father    Heart attack Brother    Depression Brother    HIV/AIDS Brother      Social History   Socioeconomic History   Marital status: Single    Spouse name: Not on file   Number of children: Not on file   Years of education: Not on file   Highest education level: Not on file  Occupational History   Not on file  Tobacco Use   Smoking status: Former    Current packs/day: 0.00    Types: Cigarettes    Quit date: 2007    Years since quitting: 17.7   Smokeless tobacco: Never  Vaping Use   Vaping status: Never Used  Substance and Sexual Activity   Alcohol use: Yes    Alcohol/week: 2.0 standard drinks of alcohol    Types: 2 Glasses of wine per week    Comment: Drinks wine occasional   Drug use: Never   Sexual activity: Not Currently  Other Topics Concern   Not on file  Social History Narrative   Right Handed    Lives in a one story home - [place]    Social Determinants of Health   Financial Resource Strain: Not on file  Food Insecurity: Not on file  Transportation Needs: Not on file  Physical Activity: Not on file  Stress: Not on file  Social Connections: Unknown (12/27/2021)   Received from [facility], [facility]   Social Network    Social Network: Not on file  Intimate Partner Violence: Unknown (12/27/2021)   Received from [employer], [facility]   HITS    Physically Hurt: Not on file    Insult or Talk Down To: Not on file    Threaten Physical Harm: Not on file    Scream or Curse: Not on file     No Known Allergies   Outpatient  Medications Prior to Visit  Medication Sig Dispense Refill   acetaminophen (TYLENOL) 650 MG CR tablet Take 650 mg by mouth every 8 (eight) hours as needed for pain.     apixaban (ELIQUIS) 5 MG TABS tablet Take 1 tablet (5 mg total) by mouth 2 (two) times daily. 180 tablet 3   clonazePAM (KLONOPIN) 0.5 MG tablet Take 0.5 mg by mouth daily.     cyclobenzaprine (FLEXERIL) 10 MG tablet Take 10-20 mg by mouth  daily as needed for muscle spasms.     dorzolamide (TRUSOPT) 2 % ophthalmic solution Place 1 drop into the right eye 3 (three) times daily.     folic acid (FOLVITE) 1 MG tablet Take 1 tablet by mouth daily.     hydrochlorothiazide (HYDRODIURIL) 50 MG tablet Take 50 mg by mouth in the morning.     ipratropium-albuterol (DUONEB) 0.5-2.5 (3) MG/3ML SOLN Take 3 mLs by nebulization every 6 (six) hours as needed. 1080 mL 3   lisinopril (ZESTRIL) 40 MG tablet Take 1 tablet (40 mg total) by mouth in the morning. 90 tablet 3   LUMIGAN 0.01 % SOLN Place 1 drop into both eyes at bedtime.     methotrexate (RHEUMATREX) 2.5 MG tablet Take 2.5 mg by mouth once a week. Caution:Chemotherapy. Protect from light. 10 tablets Take only on Tuesday's 2 tablets  (5 mg) in the morning 2 tablets (5 mg) at night     oxyCODONE-acetaminophen (PERCOCET) 10-325 MG tablet Take 1 tablet by mouth 3 (three) times daily.     pantoprazole (PROTONIX) 40 MG tablet Take 40 mg by mouth in the morning.     potassium chloride (KLOR-CON) 10 MEQ tablet Take 20 mEq by mouth in the morning.     sertraline (ZOLOFT) 100 MG tablet Take 200 mg by mouth in the morning.     traZODone (DESYREL) 50 MG tablet Take 50 mg by mouth at bedtime.     verapamil (VERELAN) 360 MG 24 hr capsule Take 1 capsule (360 mg total) by mouth in the morning. 90 capsule 3   albuterol (VENTOLIN HFA) 108 (90 Base) MCG/ACT inhaler Inhale 2 puffs into the lungs every 6 (six) hours as needed for wheezing or shortness of breath. 18 g 3   Difluprednate 0.05 % EMUL Apply 1 drop  to eye 3 (three) times daily. Into the right eye. (Patient not taking: Reported on 11/01/2022)     No facility-administered medications prior to visit.    Review of Systems  Constitutional:  Negative for chills, fever, malaise/fatigue and weight loss.  HENT:  Positive for congestion. Negative for sinus pain and sore throat.   Eyes:        +changes in vision  Respiratory:  Positive for cough and shortness of breath. Negative for hemoptysis, sputum production and wheezing.   Cardiovascular:  Negative for chest pain, palpitations, orthopnea, claudication and leg swelling.  Gastrointestinal:  Negative for abdominal pain, heartburn, nausea and vomiting.  Genitourinary: Negative.   Musculoskeletal:  Positive for back pain and joint pain. Negative for myalgias.  Skin:  Negative for rash.  Neurological:  Negative for weakness.  Endo/Heme/Allergies: Negative.   Psychiatric/Behavioral: Negative.      Objective:   Vitals:   11/01/22 1557  BP: 120/70  Pulse: 70  Temp: 99 F (37.2 C)  TempSrc: Oral  SpO2: 97%  Weight: (!) 323 lb (146.5 kg)  Height: 5\' 11"  (1.803 m)    Physical Exam Constitutional:      General: She is not in acute distress.    Appearance: She is obese. She is not ill-appearing.  HENT:     Head: Normocephalic and atraumatic.     Nose: Nose normal.     Mouth/Throat:     Mouth: Mucous membranes are moist.  Eyes:     General: No scleral icterus.    Conjunctiva/sclera: Conjunctivae normal.  Cardiovascular:     Rate and Rhythm: Normal rate and regular rhythm.     Pulses: Normal pulses.  Heart sounds: Normal heart sounds. No murmur heard. Pulmonary:     Effort: Pulmonary effort is normal.     Breath sounds: Decreased breath sounds present. No wheezing, rhonchi or rales.  Musculoskeletal:     Right lower leg: No edema.     Left lower leg: No edema.  Skin:    General: Skin is warm and dry.  Neurological:     General: No focal deficit present.     Mental  Status: She is alert.     CBC    Component Value Date/Time   WBC 6.2 11/08/2022 1622   WBC 4.2 06/20/2022 0953   RBC 3.70 (L) 11/08/2022 1622   HGB 12.6 11/08/2022 1622   HGB 12.3 08/14/2022 1344   HCT 36.7 11/08/2022 1622   HCT 36.1 08/14/2022 1344   PLT 145 (L) 11/08/2022 1622   PLT 162 08/14/2022 1344   MCV 99.2 11/08/2022 1622   MCV 96 08/14/2022 1344   MCH 34.1 (H) 11/08/2022 1622   MCHC 34.3 11/08/2022 1622   RDW 15.2 11/08/2022 1622   RDW 15.6 (H) 08/14/2022 1344   LYMPHSABS 2.3 11/08/2022 1622   LYMPHSABS 1.6 08/14/2022 1344   MONOABS 0.6 11/08/2022 1622   EOSABS 0.1 11/08/2022 1622   EOSABS 0.1 08/14/2022 1344   BASOSABS 0.0 11/08/2022 1622   BASOSABS 0.0 08/14/2022 1344    Chest imaging: CTA Chest 04/25/22 1. Acute pulmonary embolus with moderate embolic burden and CT evidence of right heart strain (RV/LV equals 1.47). 2. Minimal scattered nodular infiltrates within the upper lobes bilaterally which may be infectious or inflammatory in the acute setting.  CTA Chest 07/16/21 1. No evidence of pulmonary embolism. 2. Subtle perilymphatic nodular thickening most bilaterally with mild mosaic attenuation. Appearance can be seen in the setting of pulmonary sarcoidosis. A nonspecific viral bronchiolitis also result in this appearance. 3. Redemonstration of multiple enlarged mediastinal and bilateral hilar lymph nodes, likely related to history of sarcoidosis. 4. Mildly dilated main pulmonary trunk, which can be seen in the setting of pulmonary arterial hypertension.  HRCT Chest 10/19/20 1. Examination of the lungs is somewhat limited by body habitus and related photopenia. Within this limitation, there is mild, bandlike bibasilar scarring and or partial atelectasis without specific findings of pulmonary parenchymal sarcoidosis or other fibrotic interstitial lung disease. 2. Numerous prominent mediastinal and hilar lymph nodes, nonspecific although potentially  in keeping with nodal sarcoidosis. 3. Lobular air trapping on expiratory phase imaging, suggestive of small airways disease. 4. Enlargement of the main pulmonary artery, as can be seen in pulmonary hypertension.  MRI Cardiac 02/07/2019 - report reviewed from records from [place] Multiple conglomerated lymph nodes within the superior prevascular, right paratracheal and bilateral hilar, precarinal and subcarinal lymph nodes measuring 3.8 x 2.8cm para tracheal nodes. Bilateral multiple subpectoral and axillary lymph nodes measuring up to 1.1 x 0.8cm. New bilateral hypermetabolic diffuse groundglass and subcentimeter nodular opacities.   PFT:    Latest Ref Rng & Units 12/15/2020    3:04 PM  PFT Results  FVC-Pre L 1.83   FVC-Predicted Pre % 50   FVC-Post L 1.68   FVC-Predicted Post % 45   Pre FEV1/FVC % % 79   Post FEV1/FCV % % 83   FEV1-Pre L 1.45   FEV1-Predicted Pre % 49   FEV1-Post L 1.39   DLCO uncorrected ml/min/mmHg 21.12   DLCO UNC% % 81   DLVA Predicted % 128   TLC L 4.68   TLC % Predicted % 76  RV % Predicted % 92   PFT 2022: Mild restrictive defect present  Echo 09/28/20: EF 70-75%. Mild LVH. Grade I diastolic dysfunction. RV systolic function is normal. RV size is normal. LA is mildly dilated.   EKG 09/15/20: Normal sinus rhythm, PR 160, QRS 94, Qtc 493  LHC 04/24/2017 - from [place] records LVEDP mildly elevated LV systolic function is normal  Assessment & Plan:   Sarcoidosis  OSA (obstructive sleep apnea)  Nocturnal hypoxemia  Morbid obesity (HCC)  Discussion: Julie Cruz is a 57 year old woman, former smoker with hypertension, pulmonary hypertension, fibromyalgia, uveitis and sarcoidosis who returns to pulmonary clinic for follow up of sarcoidosis and DVT/PE.  She has history of sarcoidosis with confirmed lung biopsy from 2007 at [facility] in [place]. She was previously treated with high dose steroids and then recommended to start methotrexate for  involvement of uveitis and polyarthropathy by rheumatology. She did not start this treatment due to concerns of side effects. HRCTChest  10/19/20 shows prominent hilar and mediastinal lymph nodes but no significant parenchymal involvement of sarcoidosis. CTA Chest 07/16/21 shows similar lymph node findings in the chest and subtle perilymphatic nodular thickening bilaterally of the mid/upper lobes with mild mosaic attenuation.   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 Recent CTA Chest does not show any changes in her mediastinal lymph node and parenchymal lung findings.   In regard to her upcoming surgery:  ARISCAT Score for Postoperative Pulmonary Complications Low risk 1.6%  to moderate risk 13.3% risk of in-hospital post-op pulmonary complications (composite including respiratory failure, respiratory infection, pleural effusion, atelectasis, pneumothorax, bronchospasm, aspiration pneumonitis)  She is to continue eliquis for DVT/PE. Will need to hold eliquis 2 days prior to surgery and will need lovneox bridge.  She can continue albuterol inhaler as needed.   She is on bipap therapy for her sleep apnea and is using 2L supplemental oxygen.  Follow up in 4 months  Melody Comas, MD [facility] Office: [phone]   Current Outpatient Medications:    acetaminophen (TYLENOL) 650 MG CR tablet, Take 650 mg by mouth every 8 (eight) hours as needed for pain., Disp: , Rfl:    apixaban (ELIQUIS) 5 MG TABS tablet, Take 1 tablet (5 mg total) by mouth 2 (two) times daily., Disp: 180 tablet, Rfl: 3   clonazePAM (KLONOPIN) 0.5 MG tablet, Take 0.5 mg by mouth daily., Disp: , Rfl:    cyclobenzaprine (FLEXERIL) 10 MG tablet, Take 10-20 mg by mouth daily as needed for muscle spasms., Disp: , Rfl:    dorzolamide (TRUSOPT) 2 % ophthalmic solution, Place 1 drop into the right eye 3 (three) times daily., Disp: , Rfl:     folic acid (FOLVITE) 1 MG tablet, Take 1 tablet by mouth daily., Disp: , Rfl:    hydrochlorothiazide (HYDRODIURIL) 50 MG tablet, Take 50 mg by mouth in the morning., Disp: , Rfl:    ipratropium-albuterol (DUONEB) 0.5-2.5 (3) MG/3ML SOLN, Take 3 mLs by nebulization every 6 (six) hours as needed., Disp: 1080 mL, Rfl: 3   lisinopril (ZESTRIL) 40 MG tablet, Take 1 tablet (40 mg total) by mouth in the morning., Disp: 90 tablet, Rfl: 3   LUMIGAN 0.01 % SOLN, Place 1 drop into both eyes at bedtime., Disp: , Rfl:    methotrexate (RHEUMATREX) 2.5 MG tablet, Take 2.5 mg by mouth once a week. Caution:Chemotherapy. Protect from light. 10 tablets Take only on  Tuesday's 2 tablets  (5 mg) in the morning 2 tablets (5 mg) at night, Disp: , Rfl:    oxyCODONE-acetaminophen (PERCOCET) 10-325 MG tablet, Take 1 tablet by mouth 3 (three) times daily., Disp: , Rfl:    pantoprazole (PROTONIX) 40 MG tablet, Take 40 mg by mouth in the morning., Disp: , Rfl:    potassium chloride (KLOR-CON) 10 MEQ tablet, Take 20 mEq by mouth in the morning., Disp: , Rfl:    sertraline (ZOLOFT) 100 MG tablet, Take 200 mg by mouth in the morning., Disp: , Rfl:    traZODone (DESYREL) 50 MG tablet, Take 50 mg by mouth at bedtime., Disp: , Rfl:    verapamil (VERELAN) 360 MG 24 hr capsule, Take 1 capsule (360 mg total) by mouth in the morning., Disp: 90 capsule, Rfl: 3   albuterol (VENTOLIN HFA) 108 (90 Base) MCG/ACT inhaler, Inhale 2 puffs into the lungs every 6 (six) hours as needed for wheezing or shortness of breath., Disp: 18 g, Rfl: 3   Difluprednate 0.05 % EMUL, Apply 1 drop to eye 3 (three) times daily. Into the right eye. (Patient not taking: Reported on 11/01/2022), Disp: , Rfl:

## 2022-11-01 NOTE — Telephone Encounter (Signed)
Fax received from Dr. Donell Beers with [facility] to perform a Right Breast Lumpectomy on patient.  Patient needs surgery clearance. Surgery is Pending. Patient was seen on 05/30/22. Office protocol is a risk assessment can be sent to surgeon if patient has been seen in 60 days or less.   Sending to Dr. Francine Graven for risk assessment or recommendations if patient needs to be seen in office prior to surgical procedure.    Dr. Francine Graven patient has OV scheduled for today to be cleared for surgery

## 2022-11-02 ENCOUNTER — Telehealth (HOSPITAL_COMMUNITY): Payer: Self-pay

## 2022-11-02 ENCOUNTER — Ambulatory Visit: Payer: Medicare HMO | Admitting: Cardiology

## 2022-11-02 NOTE — Telephone Encounter (Signed)
Dr. Francine Graven I spoke with Adelina Mings at [facility] surgery. They are requesting you provide patient with Lovenox bridge since our office prescribes the eliquis

## 2022-11-02 NOTE — Telephone Encounter (Signed)
  [facility]   Pre-operative Risk Assessment       Request for Surgical Clearance    Procedure:   Right Breast Lumpectomy   Date of Surgery:  Clearance TBD                                Surgeon:  Dr. Almond Lint Surgeon's Group or Practice Name:  [facility]  Phone number:  [phone] Fax number:  [phone] ATTN Santiago Glad, CMA   Type of Clearance Requested:   - Medical  - Pharmacy:  Hold Apixaban (Eliquis) PLEASE ADVISE   Type of Anesthesia:  General    Additional requests/questions:  Please fax a copy of CLEARANCE AND RECOMMENDATIONS to the surgeon's office.  Signed, Julie Cruz Julie Cruz   11/02/2022, 9:31 AM   [facility] [address] [facility] [place] [address] [phone] (office) [phone] (fax)

## 2022-11-03 ENCOUNTER — Telehealth (HOSPITAL_COMMUNITY): Payer: Self-pay | Admitting: Cardiology

## 2022-11-03 NOTE — Telephone Encounter (Signed)
LATE ENTRY Spoke with patient 9/5 Patient reports she was advised to replaced zio monitor after device fell off-profuse sweating. New monitor to be mailed directly to patient per zio.  Pt concerned devise will fall off again, advised to follow instructions from zio can discuss further with provider if problem continues.

## 2022-11-07 NOTE — Progress Notes (Signed)
Radiation Oncology         [phone] ________________________________  Initial Outpatient Consultation  Name: Julie Cruz MRN: [id number]  Date: 11/08/2022  DOB: 04-06-1965  ON:GEXBM, Jocelyn Lamer, MD  Almond Lint, MD   REFERRING PHYSICIAN: Almond Lint, MD  DIAGNOSIS:    ICD-10-CM   1. Ductal carcinoma in situ (DCIS) of right breast  D05.11        Cancer Staging  No matching staging information was found for the patient.  Stage 0 (cTis (DCIS), cN0, cM0) Right Breast LOQ, Intermediate grade DCIS, ER+ / PR not assessed / Her2 not assessed   CHIEF COMPLAINT: Here to discuss management of right breast DCIS  HISTORY OF PRESENT ILLNESS::Julie Cruz is a 57 y.o. female who presented with right breast abnormality on a bilateral screening mammogram performed this past July (report not currently available). No symptoms, if any, were reported at that time. Right breast diagnostic mammogram performed at [facility] on 09/29/22 further revealed new suspicious calcifications in the lateral right breast measuring approximately 2.6 in the greatest extent. No abnormal right axillary lymph nodes were appreciated, however this is in the absence of a sonographic evaluation.   Biopsy of the lower outer right breast calcifications on date of 10/12/22 showed intermediate grade DCIS.  ER status: 100% positive with strong staining intensity; PR status not assessed, Her2 not assessed. No lymph nodes were examined.   Accordingly, the patient was referred to Dr. Donell Beers on 10/24/22 to discuss surgical treatment options. The patient is on methotrexate for sarcoidosis (detailed below) which would need to be held for surgery in light of wound healing issues. In order to hold this, she will require clearance from cardiology, pulmonology, and ophthalmology. She is also on eliquis which will need to held several days prior to surgery. At this time, the patient is interested in pursuing breast conserving surgery  pending clearance to hold her medications.   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 She is currently on methotrexate for this. She has tried steroids which severely exacerbated her depression. She currently is awaiting an appointment with rheumatology to discuss dual treatment with an additional immunosuppressive medication. Patient has had some heart failure but has preserved systolic function.   ***  PREVIOUS RADIATION THERAPY: No  PAST MEDICAL HISTORY:  has a past medical history of Acute medial meniscus tear, Anxiety, Bipolar 1 disorder (HCC), Depression, Fibromyalgia, H/O spinal cord injury, Hypertension, Menopause, Pulmonary hypertension (HCC), and Sarcoid, cardiac.    PAST SURGICAL HISTORY: Past Surgical History:  Procedure Laterality Date   C Section     Catherization      x2   COLONOSCOPY WITH PROPOFOL N/A 10/15/2020   Procedure: COLONOSCOPY WITH PROPOFOL;  Surgeon: Jeani Hawking, MD;  Location: [facility];  Service: Endoscopy;  Laterality: N/A;   Etopic  pregnacy     LUNG BIOPSY     TUBAL LIGATION      FAMILY HISTORY: family history includes Asthma in her mother; Cancer in her father; Depression in her brother and mother; Diabetes in her father; HIV/AIDS in her brother; Heart attack in her brother; Hyperlipidemia in her mother; Rheum arthritis in her mother; Thyroid disease in her mother.  SOCIAL HISTORY:  reports that she quit smoking about 17 years ago. Her smoking use included cigarettes. She has never used smokeless tobacco. She reports current alcohol use of about 2.0 standard drinks of alcohol per week. She reports that she does not  use drugs.  ALLERGIES: Patient has no known allergies.  MEDICATIONS:  Current Outpatient Medications  Medication Sig Dispense Refill   acetaminophen (TYLENOL) 650 MG CR tablet Take 650 mg by mouth every 8 (eight) hours as needed for pain.     albuterol (VENTOLIN HFA) 108 (90 Base)  MCG/ACT inhaler Inhale 2 puffs into the lungs every 6 (six) hours as needed for wheezing or shortness of breath. 18 g 3   apixaban (ELIQUIS) 5 MG TABS tablet Take 1 tablet (5 mg total) by mouth 2 (two) times daily. 180 tablet 3   clonazePAM (KLONOPIN) 0.5 MG tablet Take 0.5 mg by mouth daily.     cyclobenzaprine (FLEXERIL) 10 MG tablet Take 10-20 mg by mouth daily as needed for muscle spasms.     Difluprednate 0.05 % EMUL Apply 1 drop to eye 3 (three) times daily. Into the right eye. (Patient not taking: Reported on 11/01/2022)     dorzolamide (TRUSOPT) 2 % ophthalmic solution Place 1 drop into the right eye 3 (three) times daily.     folic acid (FOLVITE) 1 MG tablet Take 1 tablet by mouth daily.     hydrochlorothiazide (HYDRODIURIL) 50 MG tablet Take 50 mg by mouth in the morning.     ipratropium-albuterol (DUONEB) 0.5-2.5 (3) MG/3ML SOLN Take 3 mLs by nebulization every 6 (six) hours as needed. 1080 mL 3   lisinopril (ZESTRIL) 40 MG tablet Take 1 tablet (40 mg total) by mouth in the morning. 90 tablet 3   LUMIGAN 0.01 % SOLN Place 1 drop into both eyes at bedtime.     methotrexate (RHEUMATREX) 2.5 MG tablet Take 2.5 mg by mouth once a week. Caution:Chemotherapy. Protect from light. 10 tablets Take only on Tuesday's 2 tablets  (5 mg) in the morning 2 tablets (5 mg) at night     oxyCODONE-acetaminophen (PERCOCET) 10-325 MG tablet Take 1 tablet by mouth 3 (three) times daily.     pantoprazole (PROTONIX) 40 MG tablet Take 40 mg by mouth in the morning.     potassium chloride (KLOR-CON) 10 MEQ tablet Take 20 mEq by mouth in the morning.     sertraline (ZOLOFT) 100 MG tablet Take 200 mg by mouth in the morning.     traZODone (DESYREL) 50 MG tablet Take 50 mg by mouth at bedtime.     verapamil (VERELAN) 360 MG 24 hr capsule Take 1 capsule (360 mg total) by mouth in the morning. 90 capsule 3   No current facility-administered medications for this encounter.    REVIEW OF SYSTEMS: As above in HPI.    PHYSICAL EXAM:  vitals were not taken for this visit.   General: Alert and oriented, in no acute distress HEENT: Head is normocephalic. Extraocular movements are intact. Oropharynx is clear. Neck: Neck is supple, no palpable cervical or supraclavicular lymphadenopathy. Heart: Regular in rate and rhythm with no murmurs, rubs, or gallops. Chest: Clear to auscultation bilaterally, with no rhonchi, wheezes, or rales. Abdomen: Soft, nontender, nondistended, with no rigidity or guarding. Extremities: No cyanosis or edema. Lymphatics: see Neck Exam Skin: No concerning lesions. Musculoskeletal: symmetric strength and muscle tone throughout. Neurologic: Cranial nerves II through XII are grossly intact. No obvious focalities. Speech is fluent. Coordination is intact. Psychiatric: Judgment and insight are intact. Affect is appropriate. Breasts: *** . No other palpable masses appreciated in the breasts or axillae *** .    ECOG = ***  0 - Asymptomatic (Fully active, able to carry on all predisease activities without  restriction)  1 - Symptomatic but completely ambulatory (Restricted in physically strenuous activity but ambulatory and able to carry out work of a light or sedentary nature. For example, light housework, office work)  2 - Symptomatic, <50% in bed during the day (Ambulatory and capable of all self care but unable to carry out any work activities. Up and about more than 50% of waking hours)  3 - Symptomatic, >50% in bed, but not bedbound (Capable of only limited self-care, confined to bed or chair 50% or more of waking hours)  4 - Bedbound (Completely disabled. Cannot carry on any self-care. Totally confined to bed or chair)  5 - Death   Santiago Glad MM, Creech RH, Tormey DC, et al. [phone]). "Toxicity and response criteria of the [facility]". Am. Evlyn Clines. Oncol. 5 (6): 649-55   LABORATORY DATA:  Lab Results  Component Value Date   WBC 3.8 08/14/2022   HGB 12.3  08/14/2022   HCT 36.1 08/14/2022   MCV 96 08/14/2022   PLT 162 08/14/2022   CMP     Component Value Date/Time   NA 138 10/25/2022 1620   NA 142 08/14/2022 1344   K 3.6 10/25/2022 1620   CL 100 10/25/2022 1620   CO2 22 10/25/2022 1620   GLUCOSE 85 10/25/2022 1620   BUN 11 10/25/2022 1620   BUN 10 08/14/2022 1344   CREATININE 1.10 (H) 10/25/2022 1620   CALCIUM 8.9 10/25/2022 1620   PROT 7.1 08/14/2022 1344   ALBUMIN 4.4 08/14/2022 1344   AST 21 08/14/2022 1344   ALT 24 08/14/2022 1344   ALKPHOS 60 08/14/2022 1344   BILITOT 0.4 08/14/2022 1344   GFRNONAA 59 (L) 10/25/2022 1620         RADIOGRAPHY: MM Outside Films Mammo  Result Date: 10/26/2022 This examination belongs to an outside facility and is stored here for comparison purposes only.  Contact the originating outside institution for any associated report or interpretation.  MM Outside Films Mammo  Result Date: 10/26/2022 This examination belongs to an outside facility and is stored here for comparison purposes only.  Contact the originating outside institution for any associated report or interpretation.  MM Outside Films Mammo  Result Date: 10/26/2022 This examination belongs to an outside facility and is stored here for comparison purposes only.  Contact the originating outside institution for any associated report or interpretation.  MM Outside Films Mammo  Result Date: 10/26/2022 This examination belongs to an outside facility and is stored here for comparison purposes only.  Contact the originating outside institution for any associated report or interpretation.     IMPRESSION/PLAN: ***   It was a pleasure meeting the patient today. We discussed the risks, benefits, and side effects of radiotherapy. I recommend radiotherapy to the *** to reduce her risk of locoregional recurrence by 2/3.  We discussed that radiation would take approximately *** weeks to complete and that I would give the patient a few weeks to  heal following surgery before starting treatment planning. *** If chemotherapy were to be given, this would precede radiotherapy. We spoke about acute effects including skin irritation and fatigue as well as much less common late effects including internal organ injury or irritation. We spoke about the latest technology that is used to minimize the risk of late effects for patients undergoing radiotherapy to the breast or chest wall. No guarantees of treatment were given. The patient is enthusiastic about proceeding with treatment. I look forward to participating in the patient's  care.  I will await her referral back to me for postoperative follow-up and eventual CT simulation/treatment planning.  On date of service, in total, I spent *** minutes on this encounter. Patient was seen in person.   __________________________________________   Lonie Peak, MD  This document serves as a record of services personally performed by Lonie Peak, MD. It was created on her behalf by Neena Rhymes, a trained medical scribe. The creation of this record is based on the scribe's personal observations and the provider's statements to them. This document has been checked and approved by the attending provider.

## 2022-11-08 ENCOUNTER — Ambulatory Visit
Admission: RE | Admit: 2022-11-08 | Discharge: 2022-11-08 | Disposition: A | Discharge status: Home / Self Care | Payer: Medicare HMO | Source: Ambulatory Visit | Attending: Radiation Oncology | Admitting: Radiation Oncology

## 2022-11-08 ENCOUNTER — Inpatient Hospital Stay: Payer: Medicare HMO

## 2022-11-08 ENCOUNTER — Encounter: Payer: Self-pay | Admitting: Radiation Oncology

## 2022-11-08 ENCOUNTER — Inpatient Hospital Stay: Payer: Medicare HMO | Attending: Hematology and Oncology | Admitting: Hematology and Oncology

## 2022-11-08 ENCOUNTER — Other Ambulatory Visit: Payer: Self-pay | Admitting: *Deleted

## 2022-11-08 VITALS — BP 127/85 | HR 74 | Temp 98.6°F | Resp 18 | Ht 71.0 in | Wt 320.0 lb

## 2022-11-08 DIAGNOSIS — M791 Myalgia, unspecified site: Secondary | ICD-10-CM | POA: Diagnosis not present

## 2022-11-08 DIAGNOSIS — N852 Hypertrophy of uterus: Secondary | ICD-10-CM | POA: Diagnosis not present

## 2022-11-08 DIAGNOSIS — D0511 Intraductal carcinoma in situ of right breast: Secondary | ICD-10-CM | POA: Diagnosis present

## 2022-11-08 DIAGNOSIS — D8689 Sarcoidosis of other sites: Secondary | ICD-10-CM | POA: Insufficient documentation

## 2022-11-08 DIAGNOSIS — Z86718 Personal history of other venous thrombosis and embolism: Secondary | ICD-10-CM | POA: Diagnosis not present

## 2022-11-08 DIAGNOSIS — Z800 Family history of malignant neoplasm of digestive organs: Secondary | ICD-10-CM | POA: Diagnosis not present

## 2022-11-08 DIAGNOSIS — I272 Pulmonary hypertension, unspecified: Secondary | ICD-10-CM | POA: Diagnosis not present

## 2022-11-08 DIAGNOSIS — R5382 Chronic fatigue, unspecified: Secondary | ICD-10-CM | POA: Diagnosis not present

## 2022-11-08 DIAGNOSIS — Z87891 Personal history of nicotine dependence: Secondary | ICD-10-CM | POA: Diagnosis not present

## 2022-11-08 LAB — CMP (CANCER CENTER ONLY)
ALT: 24 U/L (ref 0–44)
AST: 26 U/L (ref 15–41)
Albumin: 4.3 g/dL (ref 3.5–5.0)
Alkaline Phosphatase: 51 U/L (ref 38–126)
Anion gap: 7 (ref 5–15)
BUN: 12 mg/dL (ref 6–20)
CO2: 31 mmol/L (ref 22–32)
Calcium: 9.4 mg/dL (ref 8.9–10.3)
Chloride: 101 mmol/L (ref 98–111)
Creatinine: 1.05 mg/dL — ABNORMAL HIGH (ref 0.44–1.00)
GFR, Estimated: >60 mL/min (ref >60)
Glucose, Bld: 95 mg/dL (ref 70–99)
Potassium: 3.5 mmol/L (ref 3.5–5.1)
Sodium: 139 mmol/L (ref 135–145)
Total Bilirubin: 0.5 mg/dL (ref 0.3–1.2)
Total Protein: 7.5 g/dL (ref 6.5–8.1)

## 2022-11-08 LAB — CBC WITH DIFFERENTIAL (CANCER CENTER ONLY)
Abs Immature Granulocytes: 0.01 10*3/uL (ref 0.00–0.07)
Basophils Absolute: 0 10*3/uL (ref 0.0–0.1)
Basophils Relative: 0 %
Eosinophils Absolute: 0.1 10*3/uL (ref 0.0–0.5)
Eosinophils Relative: 1 %
HCT: 36.7 % (ref 36.0–46.0)
Hemoglobin: 12.6 g/dL (ref 12.0–15.0)
Immature Granulocytes: 0 %
Lymphocytes Relative: 37 %
Lymphs Abs: 2.3 10*3/uL (ref 0.7–4.0)
MCH: 34.1 pg — ABNORMAL HIGH (ref 26.0–34.0)
MCHC: 34.3 g/dL (ref 30.0–36.0)
MCV: 99.2 fL (ref 80.0–100.0)
Monocytes Absolute: 0.6 10*3/uL (ref 0.1–1.0)
Monocytes Relative: 9 %
Neutro Abs: 3.3 10*3/uL (ref 1.7–7.7)
Neutrophils Relative %: 53 %
Platelet Count: 145 10*3/uL — ABNORMAL LOW (ref 150–400)
RBC: 3.7 MIL/uL — ABNORMAL LOW (ref 3.87–5.11)
RDW: 15.2 % (ref 11.5–15.5)
Smear Review: NORMAL
WBC Count: 6.2 10*3/uL (ref 4.0–10.5)
nRBC: 0 % (ref 0.0–0.2)

## 2022-11-09 ENCOUNTER — Encounter: Payer: Self-pay | Admitting: General Practice

## 2022-11-09 ENCOUNTER — Inpatient Hospital Stay: Payer: Medicare HMO | Admitting: Licensed Clinical Social Worker

## 2022-11-09 ENCOUNTER — Encounter: Payer: Self-pay | Admitting: Radiation Oncology

## 2022-11-09 ENCOUNTER — Telehealth: Payer: Self-pay

## 2022-11-09 DIAGNOSIS — D0511 Intraductal carcinoma in situ of right breast: Secondary | ICD-10-CM

## 2022-11-09 NOTE — Progress Notes (Signed)
[facility] Clinical Social Work  Initial Assessment   Julie Cruz is a 57 y.o. year old female contacted by phone. Clinical Social Work was referred by medical provider for assessment of psychosocial needs.   SDOH (Social Determinants of Health) assessments performed: Yes   SDOH Screenings   Social Connections: Unknown (12/27/2021)   Received from [facility], [facility]  Tobacco Use: Medium Risk (11/09/2022)     Distress Screen completed: No     No data to display            Family/Social Information:  Housing Arrangement: patient lives with 2 of her adult daughters, one of which is autistic.   Family members/support persons in your life? Pt has 4 daughters and 1 son altogether who reside locally and can provide assistance as needed. Transportation concerns: no  Employment: Disabled since 2010 due to sarcoidosis.  Income source: Secretary/administrator concerns: No Type of concern: None Food access concerns: no Religious or spiritual practice: Yes-[employer] Currently in place:  none  Coping/ Adjustment to diagnosis: Patient understands treatment plan and what happens next? yes Concerns about diagnosis and/or treatment: Overwhelmed by information and Afraid of cancer Patient reported stressors: Depression, Anxiety/ nervousness, and Adjusting to my illness Hopes and/or priorities: Pt's priority is to schedule surgery w/ the hope of positive results and no need for further treatment. Patient enjoys time with family/ friends Current coping skills/ strengths: Capable of independent living , Motivation for treatment/growth , and Supportive family/friends     SUMMARY: Current SDOH Barriers:  No barriers identified at this time.  Clinical Social Work Clinical Goal(s):  No clinical social work goals at this time  Interventions: Discussed common feeling and emotions when being diagnosed with cancer, and the importance of support during  treatment Informed patient of the support team roles and support services at [facility] Provided CSW contact information and encouraged patient to call with any questions or concerns Pt has multiple co-morbidities and has been navigating her healthcare for some time.  Pt states it is anticipated she may only need surgery, which she is hopeful for.  Pt has a Pmhx significant for depression and bi-polar disorder.  Pt states she is connected to a psychiatrist who prescribes her medication and has utilized counseling off and on through the years.   Pt already connected to cancer center chaplain and has been made aware that counseling is available while undergoing treatment.     Follow Up Plan: Patient will contact CSW with any support or resource needs Patient verbalizes understanding of plan: Yes    Rachel Moulds, LCSW Clinical Social Worker [facility]

## 2022-11-09 NOTE — Progress Notes (Signed)
[facility] CONSULT NOTE  Patient Care Team: Leilani Able, MD as PCP - General (Family Medicine) Little Ishikawa, MD as PCP - Cardiology (Cardiology)  CHIEF COMPLAINTS/PURPOSE OF CONSULTATION:  Newly diagnosed right breast DCIS  HISTORY OF PRESENTING ILLNESS:  Julie Cruz 57 y.o. female is here because of recent diagnosis of right breast DCIS.  Patient was having uterine bleeding and went to see her gynecologist who performed evaluation and determined that she did not have any uterine cancer but incidentally obtained a mammogram in August 2024 which revealed a 2.7 cm area of calcifications which on biopsy came back as DCIS that is ER positive.  She is here today accompanied by her daughter. She does note that she has profound sarcoidosis that involves her lungs and heart and is awaiting clearance from multiple providers to see if she could undergo surgery.  I reviewed her records extensively and collaborated the history with the patient.  SUMMARY OF ONCOLOGIC HISTORY: Oncology History  Ductal carcinoma in situ (DCIS) of right breast  10/26/2022 Initial Diagnosis   Ductal carcinoma in situ (DCIS) of right breast      MEDICAL HISTORY:  Past Medical History:  Diagnosis Date   Acute medial meniscus tear    Anxiety    Bipolar 1 disorder (HCC)    Depression    Fibromyalgia    H/O spinal cord injury    T, L ans CSpine   Hypertension    Menopause    Pulmonary hypertension (HCC)    Sarcoid, cardiac     SURGICAL HISTORY: Past Surgical History:  Procedure Laterality Date   C Section     Catherization      x2   COLONOSCOPY WITH PROPOFOL N/A 10/15/2020   Procedure: COLONOSCOPY WITH PROPOFOL;  Surgeon: Jeani Hawking, MD;  Location: [facility];  Service: Endoscopy;  Laterality: N/A;   Etopic  pregnacy     LUNG BIOPSY     TUBAL LIGATION      SOCIAL HISTORY: Social History   Socioeconomic History   Marital status: Single    Spouse name: Not on  file   Number of children: Not on file   Years of education: Not on file   Highest education level: Not on file  Occupational History   Not on file  Tobacco Use   Smoking status: Former    Current packs/day: 0.00    Types: Cigarettes    Quit date: 2007    Years since quitting: 17.7   Smokeless tobacco: Never  Vaping Use   Vaping status: Never Used  Substance and Sexual Activity   Alcohol use: Yes    Alcohol/week: 2.0 standard drinks of alcohol    Types: 2 Glasses of wine per week    Comment: Drinks wine occasional   Drug use: Never   Sexual activity: Not Currently  Other Topics Concern   Not on file  Social History Narrative   Right Handed    Lives in a one story home - [place]    Social Determinants of Health   Financial Resource Strain: Not on file  Food Insecurity: Not on file  Transportation Needs: Not on file  Physical Activity: Not on file  Stress: Not on file  Social Connections: Unknown (12/27/2021)   Received from [facility]   Social Network    Social Network: Not on file  Intimate Partner Violence: Unknown (12/27/2021)   Received from [facility], [facility]   HITS  Physically Hurt: Not on file    Insult or Talk Down To: Not on file    Threaten Physical Harm: Not on file    Scream or Curse: Not on file    FAMILY HISTORY: Family History  Problem Relation Age of Onset   Hyperlipidemia Mother    Asthma Mother    Thyroid disease Mother    Depression Mother    Rheum arthritis Mother    Cancer Father        Pancreatic Cancer   Diabetes Father    Heart attack Brother    Depression Brother    HIV/AIDS Brother     ALLERGIES:  has No Known Allergies.  MEDICATIONS:  Current Outpatient Medications  Medication Sig Dispense Refill   acetaminophen (TYLENOL) 650 MG CR tablet Take 650 mg by mouth every 8 (eight) hours as needed for pain.     albuterol (VENTOLIN HFA) 108 (90 Base) MCG/ACT inhaler Inhale 2 puffs into the lungs  every 6 (six) hours as needed for wheezing or shortness of breath. 18 g 3   apixaban (ELIQUIS) 5 MG TABS tablet Take 1 tablet (5 mg total) by mouth 2 (two) times daily. 180 tablet 3   clonazePAM (KLONOPIN) 0.5 MG tablet Take 0.5 mg by mouth daily.     cyclobenzaprine (FLEXERIL) 10 MG tablet Take 10-20 mg by mouth daily as needed for muscle spasms.     Difluprednate 0.05 % EMUL Apply 1 drop to eye 3 (three) times daily. Into the right eye. (Patient not taking: Reported on 11/01/2022)     dorzolamide (TRUSOPT) 2 % ophthalmic solution Place 1 drop into the right eye 3 (three) times daily.     folic acid (FOLVITE) 1 MG tablet Take 1 tablet by mouth daily.     hydrochlorothiazide (HYDRODIURIL) 50 MG tablet Take 50 mg by mouth in the morning.     ipratropium-albuterol (DUONEB) 0.5-2.5 (3) MG/3ML SOLN Take 3 mLs by nebulization every 6 (six) hours as needed. 1080 mL 3   lisinopril (ZESTRIL) 40 MG tablet Take 1 tablet (40 mg total) by mouth in the morning. 90 tablet 3   LUMIGAN 0.01 % SOLN Place 1 drop into both eyes at bedtime.     methotrexate (RHEUMATREX) 2.5 MG tablet Take 2.5 mg by mouth once a week. Caution:Chemotherapy. Protect from light. 10 tablets Take only on Tuesday's 2 tablets  (5 mg) in the morning 2 tablets (5 mg) at night     oxyCODONE-acetaminophen (PERCOCET) 10-325 MG tablet Take 1 tablet by mouth 3 (three) times daily.     pantoprazole (PROTONIX) 40 MG tablet Take 40 mg by mouth in the morning.     potassium chloride (KLOR-CON) 10 MEQ tablet Take 20 mEq by mouth in the morning.     sertraline (ZOLOFT) 100 MG tablet Take 200 mg by mouth in the morning.     traZODone (DESYREL) 50 MG tablet Take 50 mg by mouth at bedtime.     verapamil (VERELAN) 360 MG 24 hr capsule Take 1 capsule (360 mg total) by mouth in the morning. 90 capsule 3   No current facility-administered medications for this visit.    REVIEW OF SYSTEMS:   Constitutional: Denies fevers, chills or abnormal night sweats   All  other systems were reviewed with the patient and are negative.  PHYSICAL EXAMINATION: ECOG PERFORMANCE STATUS: 2 - Symptomatic, <50% confined to bed     GENERAL:alert, no distress and comfortable    LABORATORY DATA:  I have  reviewed the data as listed Lab Results  Component Value Date   WBC 6.2 11/08/2022   HGB 12.6 11/08/2022   HCT 36.7 11/08/2022   MCV 99.2 11/08/2022   PLT 145 (L) 11/08/2022   Lab Results  Component Value Date   NA 139 11/08/2022   K 3.5 11/08/2022   CL 101 11/08/2022   CO2 31 11/08/2022    RADIOGRAPHIC STUDIES: I have personally reviewed the radiological reports and agreed with the findings in the report.  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 It is considered a precancerous lesion. DCIS is classified as a 0. It is generally detected through mammograms as calcifications. We discussed the significance of grades and its impact on prognosis. We also discussed the importance of ER and PR receptors and their implications to adjuvant treatment options. Prognosis of DCIS dependence on grade, comedo necrosis. It is anticipated that if not treated, 20-30% of DCIS can develop into invasive breast cancer.  Recommendation: 1. Breast conserving surgery 2. Followed by adjuvant radiation therapy 3.  Tamoxifen is not recommended because of chronic health issues including history of DVTs, uterine hypertrophy, severe hot flashes, mood swings.  Return to clinic after surgery to discuss the final pathology report   All questions were answered. The patient knows to call the clinic with any problems, questions or  concerns.    Tamsen Meek, MD 11/09/22

## 2022-11-09 NOTE — Progress Notes (Signed)
[facility] Spiritual Care Note  Referred by nursing for additional layer of emotional support. Debara was very appreciative of Spiritual Care availability and used the opportunity well to share and process challenges and vulnerabilities related to physical, emotional, and spiritual health.  In this encounter, we focused on introductory/contextual information and rapport-building, and Dajai verbalized looking forward to being able to share more as we move forward with scheduled sessions.  Provided reflective listening, emotional support, normalization of feeling, and affirmation of strengths and transferable skills for coping.  We plan to follow up by phone next week, and Shalecia has direct Spiritual Care number in case needs arise in the meantime.   [address] [place], [place], [facility] Pager [phone] Voicemail [phone]

## 2022-11-09 NOTE — Assessment & Plan Note (Signed)
Systemic sarcoidosis (lung, heart) Diffuse body pains, chronic fatigue, severe hot flashes, endometrial hypertrophy, history of DVT, pulmonary hypertension  August 2024: Right breast DCIS 2.6 cm ER 100% (PR was not reported)  Pathology review: I discussed with the patient the difference between DCIS and invasive breast cancer. It is considered a precancerous lesion. DCIS is classified as a 0. It is generally detected through mammograms as calcifications. We discussed the significance of grades and its impact on prognosis. We also discussed the importance of ER and PR receptors and their implications to adjuvant treatment options. Prognosis of DCIS dependence on grade, comedo necrosis. It is anticipated that if not treated, 20-30% of DCIS can develop into invasive breast cancer.  Recommendation: 1. Breast conserving surgery 2. Followed by adjuvant radiation therapy 3.  Tamoxifen is not recommended because of chronic health issues including history of DVTs, uterine hypertrophy, severe hot flashes, mood swings.  Return to clinic after surgery to discuss the final pathology report

## 2022-11-09 NOTE — Telephone Encounter (Signed)
RN contacted Dr. Gae Bon office to let them know that Dr. Basilio Cairo would like to discuss pt methotrexate with Dr. Sherryll Burger. Rn spoke with Arline Asp at pt ophthalmology office who will send Dr. Sherryll Burger a message on Dr. Colletta Maryland behalf. Rn did provide Dr. Colletta Maryland cell phone number to Arline Asp to give to Dr. Sherryll Burger for contact.

## 2022-11-10 ENCOUNTER — Encounter: Payer: Self-pay | Admitting: Radiation Oncology

## 2022-11-10 NOTE — Progress Notes (Signed)
Regarding Julie Cruz:  I spoke with Dr. Lucillie Garfinkel fellow today and he confirmed it is okay to hold Methotrexate during radiation.  If needed, they will give her local therapies to her eyes. -----------------------------------  Lonie Peak, MD

## 2022-11-13 ENCOUNTER — Encounter: Payer: Self-pay | Admitting: *Deleted

## 2022-11-14 ENCOUNTER — Telehealth: Payer: Self-pay | Admitting: Pulmonary Disease

## 2022-11-14 ENCOUNTER — Encounter: Payer: Self-pay | Admitting: Pulmonary Disease

## 2022-11-14 DIAGNOSIS — I2609 Other pulmonary embolism with acute cor pulmonale: Secondary | ICD-10-CM

## 2022-11-14 NOTE — Telephone Encounter (Signed)
Please have pharmacy team arrange lovenox bridge. Will need to hold eliquis 2 days prior to surgery and need lovenox bridge while holding eliquis. She is to resume eliquis as soon as possible after surgery per surgeon recommendations.  Thanks, JD

## 2022-11-14 NOTE — Telephone Encounter (Signed)
Office notes has been sent, please advise on prescribing Lovenox bridge since our office prescribes the eliquis

## 2022-11-14 NOTE — Telephone Encounter (Signed)
Pt called in bc the surgeon Is waiting on surgical clearance, Pt experiencing worst pain in left side of her back to her left arm and it is so painful, when she breathes she can feel inflammation in her back

## 2022-11-14 NOTE — Telephone Encounter (Signed)
Most recent OV note completed, surgical risk evaluation listed in Assessment and plan.   Thanks, JD

## 2022-11-15 MED ORDER — ENOXAPARIN SODIUM 150 MG/ML IJ SOSY: 150.0000 mg | PREFILLED_SYRINGE | Freq: Two times a day (BID) | INTRAMUSCULAR | 0 refills | Status: DC

## 2022-11-15 NOTE — Telephone Encounter (Signed)
Closing encounter. Nfn

## 2022-11-15 NOTE — Telephone Encounter (Signed)
Anticoagulation as below - patient has not yet scheduled lumpectomy Based on recent BW of 145kg   Anticoagulation Plan  Day 1 (pre-op day 1) Hold Eliquis in morning and evening  Administer enoxaparin 150mg  in morning and 150mg  in evening  Day 2 (pre-op day 2) Hold Eliquis in morning and evening  Administer enoxaparin 150mg  in morning. Skip evening dose if procedure in morning or afternoon (hold anticoagulation 24 hours before procedure).  Surgery Day No anticoagulation (no Eliquis and no   Post-op Resume anticoag with Eliquis per surgeon recommendations post-op based on hemostasis   Patient requested that she call me back to discuss in detail once surgery is actually scheduled. Provided her with my name. Mychart message sent to her so she can respond to me directly.  Patient requesting referral to neurologist due to sciatica pain. States she was seen in [place] by neurology and they sometimes did inejctions for her to help. She has f/u on 11/20/22 with Dr. Francine Graven.  Chesley Mires, PharmD, MPH, BCPS, CPP Clinical Pharmacist (Rheumatology and Pulmonology)

## 2022-11-16 ENCOUNTER — Encounter: Payer: Self-pay | Admitting: General Practice

## 2022-11-16 NOTE — Progress Notes (Signed)
[facility] Spiritual Care Note  Followed up with Marcelino Duster by phone. She desires weekly pastoral check-ins for processing and reflection. Today she shared about intractable back pain and waiting for a neurology referral as a step toward diagnostic imaging and, hopefully, more targeted treatment and relief.   Olimpia is also interested in a community counseling referral. We plan for chaplain to do some initial searching and then to email Delna links to potential therapists for her to explore.  We also plan to follow up by phone next week. She has added chaplain to her phone contacts for ease of follow-up, too, as needed.   [address] Rush Barer, [place], [facility] Pager [phone] Voicemail [phone]

## 2022-11-17 ENCOUNTER — Other Ambulatory Visit: Payer: Self-pay | Admitting: General Surgery

## 2022-11-17 DIAGNOSIS — C50511 Malignant neoplasm of lower-outer quadrant of right female breast: Secondary | ICD-10-CM

## 2022-11-20 ENCOUNTER — Ambulatory Visit: Payer: Medicare HMO | Admitting: Pulmonary Disease

## 2022-11-22 ENCOUNTER — Encounter: Payer: Self-pay | Admitting: *Deleted

## 2022-11-30 ENCOUNTER — Inpatient Hospital Stay: Payer: Medicare HMO

## 2022-11-30 ENCOUNTER — Telehealth: Payer: Self-pay | Admitting: Pulmonary Disease

## 2022-11-30 ENCOUNTER — Telehealth: Payer: Self-pay | Admitting: Genetic Counselor

## 2022-11-30 ENCOUNTER — Inpatient Hospital Stay: Payer: Medicare HMO | Attending: Hematology and Oncology | Admitting: Genetic Counselor

## 2022-11-30 NOTE — Telephone Encounter (Signed)
Patient is currently sick. She tested negative for Covid but she is still having some issues. She has phlegm, pain in the chest and cough. She would like for an antibiotic to be called.

## 2022-11-30 NOTE — Telephone Encounter (Addendum)
I contacted Ms. Peeples to discuss rescheduling her genetic counseling appointment. She said genetic testing results would not impact surgery decisions and she is too stressed at this time to reschedule her appointment. She would like [name] to call her a couple weeks after her surgery (surgery is 12/13/2022) to get her rescheduled.   Lalla Brothers, MS, [facility] Genetic Counselor [email or web address] (P) [phone]

## 2022-11-30 NOTE — Telephone Encounter (Signed)
Spoke with patient and she states she has been sick with a chest cold for about 2 weeks. She reports increased cough, yellow mucus; denies fever/chills. At-home COVID test negative. She does not take OTC meds for anything. She would like to know what you recommend. She also notes she has been having increased mental health issues recently; asked patient if she has a BH provider, she does not but reports the cancer center is working on a referral for her.  Please advise on symptoms, thanks!

## 2022-11-30 NOTE — Telephone Encounter (Signed)
Try doxycyline 100 mg bid x 10 days then ov at 2 weeks with all meds/ inhalers / neb solutions

## 2022-12-04 MED ORDER — DOXYCYCLINE HYCLATE 100 MG PO TABS: 100.0000 mg | ORAL_TABLET | Freq: Two times a day (BID) | ORAL | 0 refills | Status: DC

## 2022-12-04 NOTE — Telephone Encounter (Signed)
Called patient and went through anticoag plan below.  Chesley Mires, PharmD, MPH, BCPS, CPP Clinical Pharmacist (Rheumatology and Pulmonology)

## 2022-12-04 NOTE — Telephone Encounter (Signed)
Rx for doxycycline since it appears this has not been sent.  Patient is going to the ER today since she is concerned for a pulmonary infection  Chesley Mires, PharmD, MPH, BCPS, CPP Clinical Pharmacist (Rheumatology and Pulmonology)

## 2022-12-06 NOTE — Progress Notes (Signed)
Spoke with pt regarding recent notes about respiratory symptoms. Pt states that she had a virtual visit with her PCP today and was prescribed Doxycycline BID for 10 days by Dr. Sherene Sires. She started the Doxycycline yesterday, 10/8. She is having symptoms, mainly a productive cough with "thick yellow-ish, green phlegm." Spoke with Fayrene Fearing, Anesthesia PA. He reached out to Dr. Arita Miss office and notified the triage nurse. I called the pt and told her to call Dr. Thurston Hole office ASAP regarding being seen in 2 weeks (per his note) and told her to set that appt up. Pt states she is calling them now. Pt advised that we will cancel PAT appt for tomorrow and reach out to her about rescheduling.

## 2022-12-06 NOTE — Telephone Encounter (Signed)
Spoke to patient and relayed below message/recommendations. She voiced her understanding. She will present to UR.  No availability on 10/22 either.   Tammy, will you review patients chart and see if she can possibly be double booked on 10/22? Thank you.

## 2022-12-06 NOTE — Telephone Encounter (Signed)
Patient states needs appointment on October 17 or October 18. Having breast surgery. No available appointments with Dr. Francine Graven or NP's. Currently taking antibx. Dr. Sherene Sires states in encounter patient needs appointment after taking antibx. Patient phone number is [phone].

## 2022-12-06 NOTE — Telephone Encounter (Signed)
Called and spoke to patient. She is requesting appt with our office for 10/17 or 10/18. No availability.  Her planned breast surgery for 10/16 has been postponed due to respiratory sx.  She stated that she picked up Rx for Doxy yesterday and has taken two dosage. She reports of prod cough with yellow to brownish sputum, rapid heart rate, chest heaviness and wheezing x2wk. SOB is baseline. Denied f/c/s or additional sx.  She would like to avoid going to the ED. She is requesting an outpatient CXR. She is using duoneb solution TID and albuterol TID  SG, please advise. Thanks Dr. Francine Graven is unavailable.

## 2022-12-07 ENCOUNTER — Inpatient Hospital Stay (HOSPITAL_COMMUNITY)
Admission: RE | Admit: 2022-12-07 | Discharge: 2022-12-07 | Disposition: A | Discharge status: Home / Self Care | Payer: Medicare HMO | Source: Ambulatory Visit

## 2022-12-07 DIAGNOSIS — Z7901 Long term (current) use of anticoagulants: Secondary | ICD-10-CM | POA: Insufficient documentation

## 2022-12-07 DIAGNOSIS — Z853 Personal history of malignant neoplasm of breast: Secondary | ICD-10-CM | POA: Diagnosis not present

## 2022-12-07 DIAGNOSIS — J449 Chronic obstructive pulmonary disease, unspecified: Secondary | ICD-10-CM | POA: Diagnosis not present

## 2022-12-07 DIAGNOSIS — R053 Chronic cough: Secondary | ICD-10-CM | POA: Diagnosis present

## 2022-12-07 DIAGNOSIS — J40 Bronchitis, not specified as acute or chronic: Secondary | ICD-10-CM | POA: Insufficient documentation

## 2022-12-07 NOTE — Telephone Encounter (Signed)
I spoke with the patient and scheduled her an appt for 11/22 at 10:00am. This is not for a pre-op clearance.  Nothing further needed.

## 2022-12-07 NOTE — Telephone Encounter (Signed)
I can see her on 10/22, can double book me .  Is that going to work though, are they needing a preop pulmonary assessment (Surgery on 10/16 ) ?

## 2022-12-08 ENCOUNTER — Encounter (HOSPITAL_COMMUNITY): Payer: Self-pay

## 2022-12-08 ENCOUNTER — Emergency Department (HOSPITAL_COMMUNITY): Payer: Medicare HMO

## 2022-12-08 ENCOUNTER — Emergency Department (HOSPITAL_COMMUNITY)
Admission: EM | Admit: 2022-12-08 | Discharge: 2022-12-08 | Disposition: A | Discharge status: Home / Self Care | Payer: Medicare HMO | Attending: Emergency Medicine | Admitting: Emergency Medicine

## 2022-12-08 ENCOUNTER — Telehealth: Payer: Self-pay | Admitting: Pulmonary Disease

## 2022-12-08 DIAGNOSIS — J40 Bronchitis, not specified as acute or chronic: Secondary | ICD-10-CM

## 2022-12-08 HISTORY — DX: Bronchitis, not specified as acute or chronic: J40

## 2022-12-08 LAB — COMPREHENSIVE METABOLIC PANEL
ALT: 25 U/L (ref 0–44)
AST: 30 U/L (ref 15–41)
Albumin: 3.7 g/dL (ref 3.5–5.0)
Alkaline Phosphatase: 53 U/L (ref 38–126)
Anion gap: 17 — ABNORMAL HIGH (ref 5–15)
BUN: 11 mg/dL (ref 6–20)
CO2: 26 mmol/L (ref 22–32)
Calcium: 9.4 mg/dL (ref 8.9–10.3)
Chloride: 97 mmol/L — ABNORMAL LOW (ref 98–111)
Creatinine, Ser: 0.95 mg/dL (ref 0.44–1.00)
GFR, Estimated: >60 mL/min (ref >60)
Glucose, Bld: 107 mg/dL — ABNORMAL HIGH (ref 70–99)
Potassium: 3.6 mmol/L (ref 3.5–5.1)
Sodium: 140 mmol/L (ref 135–145)
Total Bilirubin: 0.4 mg/dL (ref 0.3–1.2)
Total Protein: 6.9 g/dL (ref 6.5–8.1)

## 2022-12-08 LAB — CBC
HCT: 38.2 % (ref 36.0–46.0)
Hemoglobin: 12.8 g/dL (ref 12.0–15.0)
MCH: 33.6 pg (ref 26.0–34.0)
MCHC: 33.5 g/dL (ref 30.0–36.0)
MCV: 100.3 fL — ABNORMAL HIGH (ref 80.0–100.0)
Platelets: 150 10*3/uL (ref 150–400)
RBC: 3.81 MIL/uL — ABNORMAL LOW (ref 3.87–5.11)
RDW: 14.8 % (ref 11.5–15.5)
WBC: 4.4 10*3/uL (ref 4.0–10.5)
nRBC: 0 % (ref 0.0–0.2)

## 2022-12-08 LAB — HCG, SERUM, QUALITATIVE: Preg, Serum: NEGATIVE

## 2022-12-08 MED ORDER — ALBUTEROL SULFATE HFA 108 (90 BASE) MCG/ACT IN AERS
2.0000 | INHALATION_SPRAY | RESPIRATORY_TRACT | Status: DC | PRN
  Administered 2022-12-08: 2 via RESPIRATORY_TRACT
  Filled 2022-12-08: qty 6.7

## 2022-12-08 MED ORDER — CEFTRIAXONE SODIUM 1 G IJ SOLR
1.0000 g | Freq: Once | INTRAMUSCULAR | Status: AC
  Administered 2022-12-08: 1 g via INTRAMUSCULAR
  Filled 2022-12-08: qty 10

## 2022-12-08 MED ORDER — CEFDINIR 300 MG PO CAPS: 300.0000 mg | ORAL_CAPSULE | Freq: Two times a day (BID) | ORAL | 0 refills | Status: DC

## 2022-12-08 MED ORDER — PREDNISONE 20 MG PO TABS: 40.0000 mg | ORAL_TABLET | Freq: Every day | ORAL | 0 refills | Status: DC

## 2022-12-08 NOTE — ED Notes (Signed)
Advised pt to allow [name] to monitor her 30 mins s/p IM Rocephin injection. Pt able to d/c at 0725. Pt voiced understanding.

## 2022-12-08 NOTE — Telephone Encounter (Signed)
Pt states went for PAT for her Breast cancer surgery. Due to cough and congestion had a cxr done has bronchitis and went to ED and was given more antibiotics. Would like a nurse to touch base and let her know next steps

## 2022-12-08 NOTE — Discharge Instructions (Signed)
Finish the course of doxycycline.  Take the new antibiotic as well until it runs out.  Use your nebulizer every 4 hours.  You may use it in between at 2 hours if needed as well.

## 2022-12-08 NOTE — ED Triage Notes (Signed)
Pt arrived from home via Pov c/o cough that has been ongoing for approx x 1 week. Pt is on abx now, but thinks that it is not working. Cough is productive with bright, yellow, thick sputum.

## 2022-12-08 NOTE — ED Provider Notes (Signed)
[place] [facility] AT [facility] Provider Note   CSN: [id number] Arrival date & time: 12/07/22  2253     History  Chief Complaint  Patient presents with   Cough    Julie Cruz is a 57 y.o. female.  Patient with history of COPD, breast cancer presents to the emergency department with persistent cough.  She has been on 5 days of doxycycline without improvement.  Patient reports shortness of breath, persistent cough productive of greenish sputum without fever.       Home Medications Prior to Admission medications   Medication Sig Start Date End Date Taking? Authorizing Provider  cefdinir (OMNICEF) 300 MG capsule Take 1 capsule (300 mg total) by mouth 2 (two) times daily. 12/08/22  Yes Ansleigh Safer, Canary Brim, MD  predniSONE (DELTASONE) 20 MG tablet Take 2 tablets (40 mg total) by mouth daily with breakfast. 12/08/22  Yes Ethie Curless, Canary Brim, MD  acetaminophen (TYLENOL) 500 MG tablet Take 1,000 mg by mouth every 8 (eight) hours as needed for mild pain or moderate pain.    [provider]  albuterol (VENTOLIN HFA) 108 (90 Base) MCG/ACT inhaler Inhale 2 puffs into the lungs every 6 (six) hours as needed for wheezing or shortness of breath. Patient taking differently: Inhale 2 puffs into the lungs every 6 (six) hours as needed for wheezing or shortness of breath (COPD). 10/06/20 11/27/22  Martina Sinner, MD  apixaban (ELIQUIS) 5 MG TABS tablet Take 1 tablet (5 mg total) by mouth 2 (two) times daily. 06/21/22   Martina Sinner, MD  ascorbic acid (VITAMIN C) 500 MG tablet Take 500 mg by mouth daily.    [provider]  Calcium-Magnesium-Zinc (CAL-MAG-ZINC PO) Take 1 tablet by mouth daily.    [provider]  clonazePAM (KLONOPIN) 0.5 MG tablet Take 0.5 mg by mouth 2 (two) times daily.    [provider]  Cold Packs (ICE PACK) MISC 1 each by Other route daily. Heating pad and ice pack in towel    [provider]  cyclobenzaprine (FLEXERIL) 10 MG tablet Take 10-20 mg by mouth 2 (two) times daily.    [provider]  dorzolamide (TRUSOPT) 2 % ophthalmic solution Place 1 drop into the right eye 3 (three) times daily. 04/07/21   [provider]  doxycycline (VIBRA-TABS) 100 MG tablet Take 1 tablet (100 mg total) by mouth 2 (two) times daily. 12/04/22   Nyoka Cowden, MD  enoxaparin (LOVENOX) 150 MG/ML injection Inject 1 mL (150 mg total) into the skin every 12 (twelve) hours. DO NOT TAKE ELIQUIS. DO NOT TAKE DAY OF SURGERY 11/15/22   Martina Sinner, MD  folic acid (FOLVITE) 1 MG tablet Take 1 mg by mouth daily. 01/03/22   [provider]  hydrochlorothiazide (HYDRODIURIL) 50 MG tablet Take 50 mg by mouth in the morning. 06/09/20   [provider]  ipratropium-albuterol (DUONEB) 0.5-2.5 (3) MG/3ML SOLN Take 3 mLs by nebulization every 6 (six) hours as needed. Patient taking differently: Take 3 mLs by nebulization every 6 (six) hours as needed (COPD). 01/26/22   Martina Sinner, MD  lisinopril (ZESTRIL) 40 MG tablet Take 1 tablet (40 mg total) by mouth in the morning. 07/27/22   Little Ishikawa, MD  LUMIGAN 0.01 % SOLN Place 1 drop into the left eye at bedtime. 05/11/21   [provider]  Menthol, Topical Analgesic, (ICY HOT EX) Apply 1 application  topically daily.    [provider]  methotrexate (RHEUMATREX) 2.5 MG tablet Take 12.5 mg by mouth See admin instructions. Take only on Tuesday's twice a day 12.5 in the morning and 12.5 mg in the evening    [provider]  Multiple Vitamins-Minerals (MULTIVITAMIN WITH MINERALS) tablet Take 1 tablet by mouth daily. No iron    [provider]  oxyCODONE-acetaminophen (PERCOCET) 10-325 MG tablet Take 2 tablets by mouth 2 (two) times daily. 04/29/21   [provider]  pantoprazole (PROTONIX) 40 MG tablet Take 40 mg by mouth in the morning. 06/09/20   [provider]   potassium chloride (KLOR-CON) 10 MEQ tablet Take 20 mEq by mouth in the morning. 06/09/20   [provider]  sertraline (ZOLOFT) 100 MG tablet Take 200 mg by mouth in the morning. 06/09/20   [provider]  traZODone (DESYREL) 50 MG tablet Take 50 mg by mouth at bedtime as needed for sleep.    [provider]  verapamil (VERELAN) 360 MG 24 hr capsule Take 1 capsule (360 mg total) by mouth in the morning. 10/03/22   Sabharwal, Eliezer Lofts, DO      Allergies    Patient has no known allergies.    Review of Systems   Review of Systems  Physical Exam Updated Vital Signs BP 115/61   Pulse 98   Temp 98.4 F (36.9 C) (Oral)   Resp 17   SpO2 98%  Physical Exam Vitals and nursing note reviewed.  Constitutional:      General: She is not in acute distress.    Appearance: She is well-developed.  HENT:     Head: Normocephalic and atraumatic.     Mouth/Throat:     Mouth: Mucous membranes are moist.  Eyes:     General: Vision grossly intact. Gaze aligned appropriately.     Extraocular Movements: Extraocular movements intact.     Conjunctiva/sclera: Conjunctivae normal.  Cardiovascular:     Rate and Rhythm: Normal rate and regular rhythm.     Pulses: Normal pulses.     Heart sounds: Normal heart sounds, S1 normal and S2 normal. No murmur heard.    No friction rub. No gallop.  Pulmonary:     Effort: Pulmonary effort is normal. No respiratory distress.     Breath sounds: Decreased air movement present. Examination of the right-lower field reveals wheezing. Examination of the left-lower field reveals wheezing. Wheezing present.  Abdominal:     General: Bowel sounds are normal.     Palpations: Abdomen is soft.     Tenderness: There is no abdominal tenderness. There is no guarding or rebound.     Hernia: No hernia is present.  Musculoskeletal:        General: No swelling.     Cervical back: Full passive range of motion without pain, normal range of motion and neck  supple. No spinous process tenderness or muscular tenderness. Normal range of motion.     Right lower leg: No edema.     Left lower leg: No edema.  Skin:    General: Skin is warm and dry.     Capillary Refill: Capillary refill takes less than 2 seconds.     Findings: No ecchymosis, erythema, rash or wound.  Neurological:     General: No focal deficit present.     Mental Status: She is alert and oriented to person, place, and time.     GCS: GCS eye subscore is 4. GCS verbal subscore is 5. GCS motor subscore is 6.  Cranial Nerves: Cranial nerves 2-12 are intact.     Sensory: Sensation is intact.     Motor: Motor function is intact.     Coordination: Coordination is intact.  Psychiatric:        Attention and Perception: Attention normal.        Mood and Affect: Mood normal.        Speech: Speech normal.        Behavior: Behavior normal.     ED Results / Procedures / Treatments   Labs (all labs ordered are listed, but only abnormal results are displayed) Labs Reviewed  CBC - Abnormal; Notable for the following components:      Result Value   RBC 3.81 (*)    MCV 100.3 (*)    All other components within normal limits  COMPREHENSIVE METABOLIC PANEL - Abnormal; Notable for the following components:   Chloride 97 (*)    Glucose, Bld 107 (*)    Anion gap 17 (*)    All other components within normal limits  HCG, SERUM, QUALITATIVE    EKG None  Radiology DG Chest 2 View  Result Date: 12/08/2022 CLINICAL DATA:  Shortness of breath and cough EXAM: CHEST - 2 VIEW COMPARISON:  Radiograph 06/20/2022 FINDINGS: Low lung volumes. Interstitial and airspace opacities in the bilateral lower lungs and about the hila. No pleural effusion or pneumothorax. Normal cardiomediastinal silhouette. Loop recorder. IMPRESSION: Findings suggestive of bronchitis. Electronically Signed   By: Minerva Fester M.D.   On: 12/08/2022 02:50    Procedures Procedures    Medications Ordered in  ED Medications  albuterol (VENTOLIN HFA) 108 (90 Base) MCG/ACT inhaler 2 puff (has no administration in time range)  cefTRIAXone (ROCEPHIN) injection 1 g (has no administration in time range)    ED Course/ Medical Decision Making/ A&P                                 Medical Decision Making Amount and/or Complexity of Data Reviewed Labs: ordered. Radiology: ordered.  Risk Prescription drug management.   Differential Diagnosis considered includes, but not limited to: COPD exacerbation; Bronchitis; Pneumonia; CHF; ACS; PE  Presents to the emergency department for evaluation of persistent cough, shortness of breath, wheezing.  Symptoms present for 1 week.  She has been on doxycycline for 5 days.  Patient reports no improvement.  Examination reveals decreased air movement with wheezing.  Chest x-ray shows inflammatory changes consistent with bronchitis.  Patient is not hypoxic.  She does not require oxygen at this time.  She is on Eliquis and has not missed doses, doubt recurrent PE.  On examination patient exhibits persistent cough and is wheezing, this explains her symptoms.  Chest x-ray without pneumonia.  Will expand coverage and increase dosing of bronchodilators.  She is hesitant to use steroids because they "do not agree with her".  I will prescribe and she will hold them in reserve, start if not improving in several days.       Final Clinical Impression(s) / ED Diagnoses Final diagnoses:  Bronchitis    Rx / DC Orders ED Discharge Orders          Ordered    cefdinir (OMNICEF) 300 MG capsule  2 times daily        12/08/22 0634    predniSONE (DELTASONE) 20 MG tablet  Daily with breakfast        12/08/22 1610  Gilda Crease, MD 12/08/22 [phone]

## 2022-12-08 NOTE — ED Notes (Signed)
Pt educated to take antibiotics for entire tx regimen. Pt voiced understanding.

## 2022-12-10 NOTE — Telephone Encounter (Signed)
Called patient but she did not answer. Left message for patient to call back Monday morning.

## 2022-12-11 ENCOUNTER — Encounter: Payer: Self-pay | Admitting: General Practice

## 2022-12-11 NOTE — Telephone Encounter (Signed)
Spoke with the pt  She states that she went to ED 12/08/22 and was dx with bronchitis- given pred and abx  She is minimally improved, still has some cough with light yellow sputum  She still has 4 days left on abx  She is scheduled for APP here 12/19/22  I advised to keep this appt and call sooner if needed  We did not have any sooner openings at this time  Nothing further needed

## 2022-12-11 NOTE — Progress Notes (Signed)
[facility] Spiritual Care Note  Left voicemail encouraging return call.   [address] Rush Barer, [place], [facility] Pager [phone] Voicemail [phone]

## 2022-12-12 ENCOUNTER — Encounter: Payer: Self-pay | Admitting: General Practice

## 2022-12-12 NOTE — Progress Notes (Signed)
[facility] Spiritual Care Note  Spoke with Julie Cruz briefly by phone. She reports that she is still struggling with the bronchitis that ended up causing her surgery to be rescheduled; the combination has led to her feeling "very depressed." We plan to follow up by phone tomorrow at a more convenient time.   [address] Rush Barer, [place], [facility] Pager [phone] Voicemail [phone]

## 2022-12-13 ENCOUNTER — Encounter: Payer: Self-pay | Admitting: General Practice

## 2022-12-13 NOTE — Progress Notes (Signed)
[facility] Spiritual Care Note  Spoke with Alverna briefly by phone at another inconvenient time. We plan to follow up by phone on Monday 10/21.   [address] Rush Barer, [place], [facility] Pager [phone] Voicemail [phone]

## 2022-12-18 ENCOUNTER — Encounter: Payer: Self-pay | Admitting: General Practice

## 2022-12-18 NOTE — Progress Notes (Signed)
[facility] Spiritual Care Note  Left voicemail encouraging return call.   [address] Rush Barer, [place], [facility] Pager [phone] Voicemail [phone]

## 2022-12-19 ENCOUNTER — Encounter: Payer: Self-pay | Admitting: Adult Health

## 2022-12-19 ENCOUNTER — Ambulatory Visit: Payer: Medicare HMO | Admitting: Adult Health

## 2022-12-19 ENCOUNTER — Encounter: Payer: Self-pay | Admitting: *Deleted

## 2022-12-19 VITALS — BP 126/72 | HR 88 | Temp 98.1°F | Ht 71.0 in | Wt 323.0 lb

## 2022-12-19 DIAGNOSIS — Z23 Encounter for immunization: Secondary | ICD-10-CM

## 2022-12-19 DIAGNOSIS — D869 Sarcoidosis, unspecified: Secondary | ICD-10-CM

## 2022-12-19 DIAGNOSIS — G4733 Obstructive sleep apnea (adult) (pediatric): Secondary | ICD-10-CM

## 2022-12-19 DIAGNOSIS — I2699 Other pulmonary embolism without acute cor pulmonale: Secondary | ICD-10-CM | POA: Diagnosis not present

## 2022-12-19 MED ORDER — PREDNISONE 10 MG PO TABS: ORAL_TABLET | ORAL | 1 refills | Status: DC

## 2022-12-19 NOTE — Progress Notes (Unsigned)
@Patient  ID: Julie Cruz, female    DOB: March 21, 1965, 57 y.o.   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  Treated initially with steroids. Followed by rheumatology and ophthalmology on methotrexate   CTA Chest 04/25/22 1. Acute pulmonary embolus with moderate embolic burden and CT evidence of right heart strain (RV/LV equals 1.47). 2. Minimal scattered nodular infiltrates within the upper lobes bilaterally which may be infectious or inflammatory in the acute setting.   CTA Chest 07/16/21 1. No evidence of pulmonary embolism. 2. Subtle perilymphatic nodular thickening most bilaterally with mild mosaic attenuation. Appearance can be seen in the setting of pulmonary sarcoidosis. A nonspecific viral bronchiolitis also result in this appearance. 3. Redemonstration of multiple enlarged mediastinal and bilateral hilar lymph nodes, likely related to history of sarcoidosis. 4. Mildly dilated main pulmonary trunk, which can be seen in the setting of pulmonary arterial hypertension.   HRCT Chest 10/19/20 1. Examination of the lungs is somewhat limited by body habitus and related photopenia. Within this limitation, there is mild, bandlike bibasilar scarring and or partial atelectasis without specific findings of pulmonary parenchymal sarcoidosis or other fibrotic interstitial lung disease. 2. Numerous prominent mediastinal and hilar lymph nodes, nonspecific although potentially in keeping with nodal  sarcoidosis. 3. Lobular air trapping on expiratory phase imaging, suggestive of small airways disease. 4. Enlargement of the main pulmonary artery, as can be seen in pulmonary hypertension.   MRI Cardiac 02/07/2019 - report reviewed from records from [place] Multiple conglomerated lymph nodes within the superior prevascular, right paratracheal and bilateral hilar, precarinal and subcarinal lymph nodes measuring 3.8 x 2.8cm para tracheal nodes. Bilateral multiple subpectoral and axillary lymph nodes measuring up to 1.1 x 0.8cm. New bilateral hypermetabolic diffuse groundglass and subcentimeter nodular opacities.   Echo 09/28/20: EF 70-75%. Mild LVH. Grade I diastolic dysfunction. RV systolic function is normal. RV size is normal. LA is mildly dilated.    EKG 09/15/20: Normal sinus rhythm, PR 160, QRS 94, Qtc 493   LHC 04/24/2017 - from [place] records LVEDP mildly elevated LV systolic function is normal  PET CT 05/2022 -FDG uptake findings are consistent with active myocardial inflammation/sarcoidosis.   FDG uptake was observed. FDG uptake was focal. FDG uptake was present in the basal inferior and inferolateral segment(s). LV perfusion is normal. There is no evidence of infarction.   Excellent patient dietary preparation.   Left ventricular function is normal. EF: 60 %. End diastolic cavity size is normal. End systolic cavity size is normal.  12/19/2022 Follow up : Sarcoid , PE , OSA Patient returns for 6-week follow-up.  Patient is followed for chronic sarcoidosis with multisystem involvement.  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  She is followed by cardiology with cardiac involvement noted on PET cardiac CT. She is on methotrexate for maintenance regimen.  Diagnosed with Bronchitis 2 weeks ago, treated with Omnicef and prednisone . She is feeling better. Cough is improving. Chest xray showed bronchitic changes.  Previously tried on [employer] , no perceived  benefit . Uses  albuterol when she is sick.   Continues to follow with Ophthalmology for Uveitis. Remains on Methotrexate 12.5mg  weekly.  Disabled unable to work or drive.   Breast cancer  Planned for lumpectomy 12/28/22. Possible radiation . Unable to take Tamoxfien due to PE history.     No Known Allergies  Immunization History  Administered Date(s) Administered   Influenza,inj,Quad PF,6+ Mos 12/15/2020   PNEUMOCOCCAL CONJUGATE-20 12/15/2020    Past Medical History:  Diagnosis Date   Acute medial meniscus tear    Anxiety    Bipolar 1 disorder (HCC)    Depression    Fibromyalgia    H/O spinal cord injury    T, L ans CSpine   Hypertension    Menopause    Pulmonary hypertension (HCC)    Sarcoid, cardiac     Tobacco History: Social History   Tobacco Use  Smoking Status Former   Current packs/day: 0.00   Types: Cigarettes   Quit date: 2007   Years since quitting: 17.8  Smokeless Tobacco Never   Counseling given: Not Answered   Outpatient Medications Prior to Visit  Medication Sig Dispense Refill   acetaminophen (TYLENOL) 500 MG tablet Take 1,000 mg by mouth every 8 (eight) hours as needed for mild pain or moderate pain.     apixaban (ELIQUIS) 5 MG TABS tablet Take 1 tablet (5 mg total) by mouth 2 (two) times daily. 180 tablet 3   ascorbic acid (VITAMIN C) 500 MG tablet Take 500 mg by mouth daily.     Calcium-Magnesium-Zinc (CAL-MAG-ZINC PO) Take 1 tablet by mouth daily.     cefdinir (OMNICEF) 300 MG capsule Take 1 capsule (300 mg total) by mouth 2 (two) times daily. 10 capsule 0   clonazePAM (KLONOPIN) 0.5 MG tablet Take 0.5 mg by mouth 2 (two) times daily.     Cold Packs (ICE PACK) MISC 1 each by Other route daily. Heating pad and ice pack in towel     cyclobenzaprine (FLEXERIL) 10 MG tablet Take 10-20 mg by mouth 2 (two) times daily.     dorzolamide (TRUSOPT) 2 % ophthalmic solution Place 1 drop into the right eye 3 (three) times daily.     doxycycline  (VIBRA-TABS) 100 MG tablet Take 1 tablet (100 mg total) by mouth 2 (two) times daily. 20 tablet 0   enoxaparin (LOVENOX) 150 MG/ML injection Inject 1 mL (150 mg total) into the skin every 12 (twelve) hours. DO NOT TAKE ELIQUIS. DO NOT TAKE DAY OF SURGERY 4 mL 0   folic acid (FOLVITE) 1 MG tablet Take 1 mg by mouth daily.     hydrochlorothiazide (HYDRODIURIL) 50 MG tablet Take 50 mg by mouth in the morning.     ipratropium-albuterol (DUONEB) 0.5-2.5 (3) MG/3ML SOLN Take 3 mLs by nebulization every 6 (six) hours as needed. (Patient taking differently: Take 3 mLs by nebulization every 6 (six) hours as needed (COPD).) 1080 mL 3   lisinopril (ZESTRIL) 40 MG tablet Take 1 tablet (40 mg total) by mouth in the morning. 90 tablet 3   LUMIGAN 0.01 % SOLN Place 1 drop into the left eye at bedtime.     Menthol, Topical Analgesic, (ICY HOT EX) Apply 1 application  topically daily.     methotrexate (RHEUMATREX) 2.5 MG tablet Take 12.5 mg by mouth See admin instructions. Take only on Tuesday's twice a day 12.5 in the morning and 12.5 mg in the evening     Multiple Vitamins-Minerals (MULTIVITAMIN WITH MINERALS) tablet Take 1 tablet  by mouth daily. No iron     oxyCODONE-acetaminophen (PERCOCET) 10-325 MG tablet Take 2 tablets by mouth 2 (two) times daily.     pantoprazole (PROTONIX) 40 MG tablet Take 40 mg by mouth in the morning.     potassium chloride (KLOR-CON) 10 MEQ tablet Take 20 mEq by mouth in the morning.     predniSONE (DELTASONE) 20 MG tablet Take 2 tablets (40 mg total) by mouth daily with breakfast. 10 tablet 0   sertraline (ZOLOFT) 100 MG tablet Take 200 mg by mouth in the morning.     traZODone (DESYREL) 50 MG tablet Take 50 mg by mouth at bedtime as needed for sleep.     verapamil (VERELAN) 360 MG 24 hr capsule Take 1 capsule (360 mg total) by mouth in the morning. 90 capsule 3   albuterol (VENTOLIN HFA) 108 (90 Base) MCG/ACT inhaler Inhale 2 puffs into the lungs every 6 (six) hours as needed for  wheezing or shortness of breath. (Patient taking differently: Inhale 2 puffs into the lungs every 6 (six) hours as needed for wheezing or shortness of breath (COPD).) 18 g 3   No facility-administered medications prior to visit.     Review of Systems:   Constitutional:   No  weight loss, night sweats,  Fevers, chills, fatigue, or  lassitude.  HEENT:   No headaches,  Difficulty swallowing,  Tooth/dental problems, or  Sore throat,                No sneezing, itching, ear ache, nasal congestion, post nasal drip,   CV:  No chest pain,  Orthopnea, PND, swelling in lower extremities, anasarca, dizziness, palpitations, syncope.   GI  No heartburn, indigestion, abdominal pain, nausea, vomiting, diarrhea, change in bowel habits, loss of appetite, bloody stools.   Resp: No shortness of breath with exertion or at rest.  No excess mucus, no productive cough,  No non-productive cough,  No coughing up of blood.  No change in color of mucus.  No wheezing.  No chest wall deformity  Skin: no rash or lesions.  GU: no dysuria, change in color of urine, no urgency or frequency.  No flank pain, no hematuria   MS:  No joint pain or swelling.  No decreased range of motion.  No back pain.    Physical Exam  BP 126/72 (BP Location: Left Arm, Patient Position: Sitting, Cuff Size: Large)   Pulse 88   Temp 98.1 F (36.7 C) (Oral)   Ht 5\' 11"  (1.803 m)   Wt (!) 323 lb (146.5 kg)   SpO2 99%   BMI 45.05 kg/m   GEN: A/Ox3; pleasant , NAD, well nourished    HEENT:  [place]/AT,  EACs-clear, TMs-wnl, NOSE-clear, THROAT-clear, no lesions, no postnasal drip or exudate noted.   NECK:  Supple w/ fair ROM; no JVD; normal carotid impulses w/o bruits; no thyromegaly or nodules palpated; no lymphadenopathy.    RESP  Clear  P & A; w/o, wheezes/ rales/ or rhonchi. no accessory muscle use, no dullness to percussion  CARD:  RRR, no m/r/g, no peripheral edema, pulses intact, no cyanosis or clubbing.  GI:   Soft & nt; nml  bowel sounds; no organomegaly or masses detected.   Musco: Warm bil, no deformities or joint swelling noted.   Neuro: alert, no focal deficits noted.    Skin: Warm, no lesions or rashes    Lab Results:  CBC    Component Value Date/Time   WBC 4.4 12/08/2022  0139   RBC 3.81 (L) 12/08/2022 0139   HGB 12.8 12/08/2022 0139   HGB 12.6 11/08/2022 1622   HGB 12.3 08/14/2022 1344   HCT 38.2 12/08/2022 0139   HCT 36.1 08/14/2022 1344   PLT 150 12/08/2022 0139   PLT 145 (L) 11/08/2022 1622   PLT 162 08/14/2022 1344   MCV 100.3 (H) 12/08/2022 0139   MCV 96 08/14/2022 1344   MCH 33.6 12/08/2022 0139   MCHC 33.5 12/08/2022 0139   RDW 14.8 12/08/2022 0139   RDW 15.6 (H) 08/14/2022 1344   LYMPHSABS 2.3 11/08/2022 1622   LYMPHSABS 1.6 08/14/2022 1344   MONOABS 0.6 11/08/2022 1622   EOSABS 0.1 11/08/2022 1622   EOSABS 0.1 08/14/2022 1344   BASOSABS 0.0 11/08/2022 1622   BASOSABS 0.0 08/14/2022 1344    BMET    Component Value Date/Time   NA 140 12/08/2022 0139   NA 142 08/14/2022 1344   K 3.6 12/08/2022 0139   CL 97 (L) 12/08/2022 0139   CO2 26 12/08/2022 0139   GLUCOSE 107 (H) 12/08/2022 0139   BUN 11 12/08/2022 0139   BUN 10 08/14/2022 1344   CREATININE 0.95 12/08/2022 0139   CREATININE 1.05 (H) 11/08/2022 1622   CALCIUM 9.4 12/08/2022 0139   GFRNONAA >60 12/08/2022 0139   GFRNONAA >60 11/08/2022 1622    BNP    Component Value Date/Time   BNP 15.8 10/25/2022 1620    ProBNP    Component Value Date/Time   PROBNP 26 01/11/2021 1215    Imaging: DG Chest 2 View  Result Date: 12/08/2022 CLINICAL DATA:  Shortness of breath and cough EXAM: CHEST - 2 VIEW COMPARISON:  Radiograph 06/20/2022 FINDINGS: Low lung volumes. Interstitial and airspace opacities in the bilateral lower lungs and about the hila. No pleural effusion or pneumothorax. Normal cardiomediastinal silhouette. Loop recorder. IMPRESSION: Findings suggestive of bronchitis. Electronically Signed   By: Minerva Fester M.D.   On: 12/08/2022 02:50    Administration History     None          Latest Ref Rng & Units 12/15/2020    3:04 PM  PFT Results  FVC-Pre L 1.83   FVC-Predicted Pre % 50   FVC-Post L 1.68   FVC-Predicted Post % 45   Pre FEV1/FVC % % 79   Post FEV1/FCV % % 83   FEV1-Pre L 1.45   FEV1-Predicted Pre % 49   FEV1-Post L 1.39   DLCO uncorrected ml/min/mmHg 21.12   DLCO UNC% % 81   DLVA Predicted % 128   TLC L 4.68   TLC % Predicted % 76   RV % Predicted % 92     No results found for: "NITRICOXIDE"      Assessment & Plan:   No problem-specific Assessment & Plan notes found for this encounter.     Rubye Oaks, NP 12/19/2022

## 2022-12-19 NOTE — Pre-Procedure Instructions (Signed)
Surgical Instructions    Your procedure is scheduled on December 28, 2022.  Report to [facility] Main Entrance "A" at 1:00 P.M., then check in with the Admitting office.  Call this number if you have problems the morning of surgery:  [phone]   If you have any questions prior to your surgery date call [phone]: Open Monday-Friday 8am-4pm If you experience any cold or flu symptoms such as cough, fever, chills, shortness of breath, etc. between now and your scheduled surgery, please notify us at the above number     Remember:  Do not eat after midnight the night before your surgery  You may drink clear liquids until 12:00PM the morning of your surgery.   Clear liquids allowed are: Water, Non-Citrus Juices (without pulp), Carbonated Beverages, Clear Tea, Black Coffee ONLY (NO MILK, CREAM OR POWDERED CREAMER of any kind), and Gatorade    Take these medicines the morning of surgery with A SIP OF WATER:  cefdinir (OMNICEF)  clonazePAM (KLONOPIN)  cyclobenzaprine (FLEXERIL)  dorzolamide (TRUSOPT) 2 % ophthalmic solution  doxycycline (VIBRA-TABS)  oxyCODONE-acetaminophen (PERCOCET) 10-325  pantoprazole (PROTONIX)  predniSONE (DELTASONE)  sertraline (ZOLOFT)  verapamil (VERELAN)   If needed: acetaminophen (TYLENOL)  albuterol (VENTOLIN HFA) 108 (90 Base) MCG/ACT inhaler - please bring inhaler to hospital with you ipratropium-albuterol (DUONEB) 0.5-2.5 (3) MG/3ML SOLN  Follow your surgeon's instructions on whether to stop taking methotrexate (Rheumatrex) before surgery. If no instructions were given to you, please contact your surgeon's office.   Follow your surgeon's instructions on when to stop taking Eliquis (apixaban) and Lovenox (enoxaparin) before surgery. If no instructions were given to you, please contact your surgeon's office.   As of today, STOP taking any Aspirin (unless otherwise instructed by your surgeon) Aleve, Naproxen, Ibuprofen, Motrin, Advil, Goody's, BC's, all  herbal medications, fish oil, and all vitamins.            [facility] is not responsible for any belongings or valuables.    Do NOT Smoke (Tobacco/Vaping)  24 hours prior to your procedure  If you use a CPAP at night, you may bring your mask for your overnight stay.   Contacts, glasses, hearing aids, dentures or partials may not be worn into surgery, please bring cases for these belongings   For patients admitted to the hospital, discharge time will be determined by your treatment team.   Patients discharged the day of surgery will not be allowed to drive home, and someone needs to stay with them for 24 hours.   SURGICAL WAITING ROOM VISITATION Patients having surgery or a procedure may have no more than 2 support people in the waiting area - these visitors may rotate.   Children under the age of 77 must have an adult with them who is not the patient. If the patient needs to stay at the hospital during part of their recovery, the visitor guidelines for inpatient rooms apply. Pre-op nurse will coordinate an appropriate time for 1 support person to accompany patient in pre-op.  This support person may not rotate.   Please refer to [email or web address] for the visitor guidelines for Inpatients (after your surgery is over and you are in a regular room).    Special instructions:    Oral Hygiene is also important to reduce your risk of infection.  Remember - BRUSH YOUR TEETH THE MORNING OF SURGERY WITH YOUR REGULAR TOOTHPASTE   [facility]- Preparing For Surgery  Before surgery, you can play an important role. Because skin is not  sterile, your skin needs to be as free of germs as possible. You can reduce the number of germs on your skin by washing with CHG (chlorahexidine gluconate) Soap before surgery.  CHG is an antiseptic cleaner which kills germs and bonds with the skin to continue killing germs even after washing.     Please do not  use if you have an allergy to CHG or antibacterial soaps. If your skin becomes reddened/irritated stop using the CHG.  Do not shave (including legs and underarms) for at least 48 hours prior to first CHG shower. It is OK to shave your face.  Please follow these instructions carefully.     Shower the NIGHT BEFORE SURGERY and the MORNING OF SURGERY with CHG Soap.   If you chose to wash your hair, wash your hair first as usual with your normal shampoo. After you shampoo, rinse your hair and body thoroughly to remove the shampoo.  Then [employer] and genitals (private parts) with your normal soap and rinse thoroughly to remove soap.  After that Use CHG Soap as you would any other liquid soap. You can apply CHG directly to the skin and wash gently with a scrungie or a clean washcloth.   Apply the CHG Soap to your body ONLY FROM THE NECK DOWN.  Do not use on open wounds or open sores. Avoid contact with your eyes, ears, mouth and genitals (private parts). Wash Face and genitals (private parts)  with your normal soap.   Wash thoroughly, paying special attention to the area where your surgery will be performed.  Thoroughly rinse your body with warm water from the neck down.  DO NOT shower/wash with your normal soap after using and rinsing off the CHG Soap.  Pat yourself dry with a CLEAN TOWEL.  Wear CLEAN PAJAMAS to bed the night before surgery  Place CLEAN SHEETS on your bed the night before your surgery  DO NOT SLEEP WITH PETS.   Day of Surgery:  Take a shower with CHG soap. Wear Clean/Comfortable clothing the morning of surgery Do not wear jewelry or makeup. Do not wear lotions, powders, perfumes/cologne or deodorant. Do not shave 48 hours prior to surgery.  Men may shave face and neck. Do not bring valuables to the hospital. Do not wear nail polish, gel polish, artificial nails, or any other type of covering on natural nails (fingers and toes) If you have artificial nails or gel  coating that need to be removed by a nail salon, please have this removed prior to surgery. Artificial nails or gel coating may interfere with anesthesia's ability to adequately monitor your vital signs. Remember to brush your teeth WITH YOUR REGULAR TOOTHPASTE.    If you received a COVID test during your pre-op visit, it is requested that you wear a mask when out in public, stay away from anyone that may not be feeling well, and notify your surgeon if you develop symptoms. If you have been in contact with anyone that has tested positive in the last 10 days, please notify your surgeon.    Please read over the following fact sheets that you were given.

## 2022-12-19 NOTE — Patient Instructions (Addendum)
Flu shot today.  Continue on Methotrexate.  Follow up with Rheumatology today as planned  Begin Prednisone 20mg  daily for 1 week and 10mg  daily for 1 week and stop .  Good luck with surgery next week.  Continue on BIPAP At bedtime  with Oxygen .  Albuterol inhaler As needed   Continue on Eliquis 5mg  Twice daily   Follow up with Dr. Francine Graven in 6-8 weeks as planned with PFT  Please contact office for sooner follow up if symptoms do not improve or worsen or seek emergency care

## 2022-12-20 ENCOUNTER — Encounter (HOSPITAL_COMMUNITY)
Admission: RE | Admit: 2022-12-20 | Discharge: 2022-12-20 | Disposition: A | Discharge status: Home / Self Care | Payer: Medicare HMO | Source: Ambulatory Visit | Attending: General Surgery | Admitting: General Surgery

## 2022-12-20 ENCOUNTER — Other Ambulatory Visit: Payer: Self-pay

## 2022-12-20 ENCOUNTER — Encounter (HOSPITAL_COMMUNITY): Payer: Self-pay

## 2022-12-20 VITALS — BP 143/78 | HR 85 | Resp 18 | Ht 71.0 in | Wt 320.0 lb

## 2022-12-20 DIAGNOSIS — Z01818 Encounter for other preprocedural examination: Secondary | ICD-10-CM

## 2022-12-20 DIAGNOSIS — Z01812 Encounter for preprocedural laboratory examination: Secondary | ICD-10-CM | POA: Insufficient documentation

## 2022-12-20 HISTORY — DX: Acute myocardial infarction, unspecified: I21.9

## 2022-12-20 HISTORY — DX: Dyspnea, unspecified: R06.00

## 2022-12-20 HISTORY — DX: Unspecified osteoarthritis, unspecified site: M19.90

## 2022-12-20 HISTORY — DX: Heart failure, unspecified: I50.9

## 2022-12-20 HISTORY — DX: Sleep apnea, unspecified: G47.30

## 2022-12-20 HISTORY — DX: Chronic obstructive pulmonary disease, unspecified: J44.9

## 2022-12-20 HISTORY — DX: Malignant (primary) neoplasm, unspecified: C80.1

## 2022-12-20 LAB — SURGICAL PCR SCREEN
MRSA, PCR: NEGATIVE
Staphylococcus aureus: NEGATIVE

## 2022-12-20 NOTE — Progress Notes (Addendum)
PCP - Dr Leilani Able Cardiologist - Dr Dorthula Nettles  Chest x-ray - 12/08/22 (2V) EKG - 09/01/22 Stress Test - 02/07/19 ECHO - 04/25/22 Cardiac Cath - 04/24/17  Loop Recorder - Yes (2019).  Patient states it does not work.      Sleep Study -  Yes (03/2018) BiPAP - uses nightly  Diabetes - n/a   Blood Thinner Instructions:  Follow your surgeon's instructions on when to stop Eliquis prior to surgery.  ERAS: Clear liquids til 12 Noon DOS.  Seed Placement on 12/27/22 at 2 PM.  Anesthesia review: Yes, Fayrene Fearing to call patient on Monday, 12/25/22 to see how she is feeling since PAT appt. on 12/20/22.  STOP now taking any Aspirin (unless otherwise instructed by your surgeon), Aleve, Naproxen, Ibuprofen, Motrin, Advil, Goody's, BC's, all herbal medications, fish oil, and all vitamins.   Coronavirus Screening Do you have any of the following symptoms:  Cough yes/no: Yes, occasional Fever (>100.45F)  yes/no: No Runny nose yes/no: No Sore throat yes/no: No Difficulty breathing/shortness of breath  yes/no: Yes  Have you traveled in the last 14 days and where? yes/no: No  Patient verbalized understanding of instructions that were given to them at the PAT appointment. Patient was also instructed that they will need to review over the PAT instructions again at home before surgery.

## 2022-12-21 NOTE — Assessment & Plan Note (Signed)
Excellent control on nocturnal BiPAP.  Continue current settings.

## 2022-12-21 NOTE — Assessment & Plan Note (Signed)
History of PE diagnosed in February 2024.  Continue on Eliquis.  Patient education given on anticoagulation therapy

## 2022-12-21 NOTE — Assessment & Plan Note (Addendum)
Sarcoidosis with pulmonary, ocular and cardiac involvement.  From a pulmonary standpoint CT chest February 2024 showed minimal nodular infiltrates with no significant adenopathy.  Needs repeat PFTs pending those results we will decide on high-resolution CT chest to evaluate for possible parenchymal involvement.  Patient with recent bronchitic flare with improvement with steroids.  Cardiac notes indicate active inflammation.  Ophthalmology notes recommend continuing on methotrexate with stable condition. We discussed beginning low-dose prednisone for short period.  After review of her medical records.  Will continue for 2 weeks and then discontinue 20 mg daily for 1 week and then 10 mg daily for 1 week and stop.  No recommendations for additional immunosuppression.   Patient aware to stop Prednisone after 2 weeks . Check Chest xray on return or decide if HRCT is indicated  Patient is to continue follow-up with rheumatology.

## 2022-12-21 NOTE — Progress Notes (Signed)
Anesthesia Chart Review:  56 year old female with pertinent history including HTN, pulmonary hypertension, PE (03/2022), COPD, OSA on BiPAP, sarcoidosis, bipolar 1 disorder, moderately reduced RV function.  She is followed by ophthalmology and rheumatology for sarcoid/uveitis and is maintained on methotrexate.  Recently seen by cardiologist Dr. Gasper Lloyd 10/25/2022 for evaluation of potential cardiac sarcoidosis.  Per note, due to severity of her sarcoid, management largely deferred to rheumatology.  Regarding pulmonary hypertension and upcoming surgery, "2. Pulmonary hypertension  - Likely due to a combination of HFpEF/OHS/sarcoid  - Will plan on RHC in the future. Averse to procedures. 3. OSA - Bipap + O2 at night. 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  - From a cardiac standpoint, she is low risk to proceed with excision."  Recent episode of bronchitis dx'd 12/08/22. Seen in followup by pulmonology APP Rubye Oaks, NP on 12/19/2022.  Per note, "From a pulmonary standpoint CT chest February 2024 showed minimal nodular infiltrates with no significant adenopathy.  Needs repeat PFTs pending those results we will decide on high-resolution CT chest to evaluate for possible parenchymal involvement.  Patient with recent bronchitic flare with improvement with steroids.  Cardiac notes indicate active inflammation.  Ophthalmology notes recommend continuing on methotrexate with stable condition. We discussed beginning low-dose prednisone for short period.  After review of her medical records.  Will continue for 2 weeks and then discontinue.  20 mg daily for 1 week and then 10 mg daily for 1 week and stop.  No recommendations for additional immunosuppression. Patient aware to stop Prednisone after 2 weeks . Check Chest xray on return or decide if HRCT is indicated. Patient is to continue follow-up with rheumatology."  At PAT appointment  patient reported feeling much better after recent bronchitis flare, completed antibiotics and is still on prednisone.  Minimal symptoms at this time.  I advised her I will call to follow-up next week to make sure this is still the case.  I did call and speak with the pt on 12/25/22 and she stated she continues to feel well from a respiratory standpoint. Says she is currently at her baseline, denies cough or SOB. Case discussed with Dr. Chaney Malling, advised okay to proceed as planned barring acute status change.   LD Eliquis 12/25/22. She is on Lovenox bridge.   Preop labs reviewed, unremarkable.  EKG 12/08/2022: NSR.  Rate 83.  Cardiac MRI 06/26/2022: IMPRESSION: 1. Significant artifact throughout study due to implanted loop recorder   2. Small LV size, mild hypertrophy, and normal systolic function (EF 65%)   3.  Normal RV size and systolic function (EF 58%)   4. Basal septal midwall LGE, which is a scar pattern seen in nonischemic cardiomyopathies and associated with a worse prognosis   5. RV insertion site LGE, which is a nonspecific scar pattern often seen in setting of elevated pulmonary pressures  TTE 04/25/22:  1. Left ventricular ejection fraction, by estimation, is 65 to 70%. The  left ventricle has hyperdynamic function. The left ventricle has no  regional wall motion abnormalities. There is mild concentric left  ventricular hypertrophy. Left ventricular  diastolic parameters were normal.   2. Right ventricular systolic function is moderately reduced. The right  ventricular size is mildly enlarged. Tricuspid regurgitation signal is  inadequate for assessing PA pressure.   3. Left atrial size was mildly dilated.   4. Right atrial size was mildly dilated.   5.  The mitral valve is normal in structure. No evidence of mitral valve  regurgitation.   6. The aortic valve is tricuspid. There is mild thickening of the aortic  valve. Aortic valve regurgitation is not visualized.  Aortic valve  sclerosis is present, with no evidence of aortic valve stenosis.   7. The inferior vena cava is dilated in size with >50% respiratory  variability, suggesting right atrial pressure of 8 mmHg.   8. Agitated saline contrast bubble study was negative, with no evidence  of any interatrial shunt.   Comparison(s): Prior images reviewed side by side. The right ventricular  systolic function is significantly worse.     Zannie Cove [facility] Stay [facility]/Anesthesiology Phone [phone] 12/26/2022 2:08 PM

## 2022-12-25 NOTE — Addendum Note (Signed)
Encounter addended by: Crissie Figures, RN on: 12/25/2022 1:48 PM  Actions taken: Imaging Exam ended

## 2022-12-25 NOTE — H&P (Signed)
REFERRING PHYSICIAN:  Gardiner Ramus     PROVIDER:  Matthias Hughs, MD   Care Team: Patient Care Team: Karie Chimera, MD as PCP - General (Family Medicine) Matthias Hughs, MD as Consulting Provider (Surgical Oncology) Dorthula Nettles, DO (Cardiovascular Disease) Martina Sinner, MD (Pulmonary Disease) Gae Bon, MD (Ophthalmology) Francee Gentile, MD (Rheumatology)    MRN: [id number] DOB: Jan 18, 1966   Subjective    Chief Complaint: New Consultation (BREAST)   History of Present Illness: Julie Cruz is a 57 y.o. female who is seen today as an office consultation at the request of Dr. Gardiner Ramus for evaluation of New Consultation (BREAST) .     Patient presents with a new diagnosis of right breast cancer August 2024.  She was found to have a screening detected calcifications.  She subsequently underwent diagnostic mammography which showed group of microcalcifications measuring 2.6 cm in greatest dimension.  There was subtle asymmetry.  A core needle biopsy was performed.  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  Estrogen receptor positivity was 100% with strong intensity.  PR was not reported.   With regard to family cancer history, her father has had pancreatic cancer.  She had some more distant relatives with history of breast cancer.  She is quite anxious to have surgery.   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  She is currently on methotrexate for this.  She has tried steroids which severely exacerbated her depression.  She currently is awaiting an appointment with rheumatology to discuss dual treatment with an additional immunosuppressive medication.  Patient has had some heart failure but has preserved systolic function.   She does well from a pulmonary standpoint overall.  She states that her eyes are relatively stable at this point.   The patient is  also anticoagulated due to history of DVT.   Family cancer history - father pancreatic cancer   Work n/a   Diagnostic mammogram:09/29/2022 [facility] FINDINGS: New grouped microcalcifications are present laterally which  measure 2.6 x 1.5 x 1.9 cm in the AP, transverse, and longitudinal  dimensions. Some linear/branching distribution is noted. There is subtle  underlying asymmetric density.  IMPRESSION: New calcifications in the lateral right breast are suspicious.   RECOMMENDATION: Stereotactic guided biopsy. This is being scheduled at the  patient's earliest convenience.   ACR BI-RADS CATEGORY 4 -- Suspicious.      Pathology core needle biopsy: 10/12/2022 [facility] reast, right, lower outer quadrant, stereotactic needle core biopsy: -Ductal carcinoma in situ, grade 2, with comedonecrosis and microcalcifications   Receptors: Estrogen Receptor (ER) Status:    Positive (greater than 10% of cells demonstrate nuclear positivity)         Percentage of Cells with Nuclear Positivity:    100 %         Average Intensity of Staining:    Strong      Review of Systems: A complete review of systems was obtained from the patient.  I have reviewed this information and discussed as appropriate with the patient.  See HPI as well for other ROS. ROS -otherwise negative       Medical History: Past Medical History  History reviewed. No pertinent past medical history.     Problem List     Patient Active Problem List  Diagnosis   Malignant neoplasm of lower-outer quadrant of right breast of female, estrogen receptor positive (CMS/HHS-HCC)   Cardiac sarcoidosis (HHS-HCC)  Bipolar 1 disorder (CMS/HHS-HCC)   Fibromyalgia   Pulmonary hypertension (CMS/HHS-HCC)   H/O deep venous thrombosis   Chronic systolic heart failure (CMS/HHS-HCC)   OSA (obstructive sleep apnea)   History of pulmonary embolism   Current use of long term anticoagulation   Glaucoma associated with ocular inflammation   Morbid  obesity with BMI of 50.0-59.9, adult (CMS/HHS-HCC)        Past Surgical History       Past Surgical History:  Procedure Laterality Date   CESAREAN SECTION       LAPAROSCOPIC TUBAL LIGATION            Allergies  No Known Allergies     Medications Ordered Prior to Encounter        Current Outpatient Medications on File Prior to Visit  Medication Sig Dispense Refill   acetaminophen (TYLENOL) 650 MG ER tablet Take 650 mg by mouth every 8 (eight) hours as needed       bimatoprost (LUMIGAN) 0.01 % ophthalmic solution 1 drop at bedtime       clonazePAM (KLONOPIN) 0.5 MG tablet Take 0.5 mg by mouth once daily       cyclobenzaprine (FLEXERIL) 10 MG tablet Take by mouth       dorzolamide (TRUSOPT) 2 % ophthalmic solution Apply 1 drop to eye 3 (three) times daily       folic acid (FOLVITE) 1 MG tablet Take 1 tablet by mouth once daily       hydroCHLOROthiazide (HYDRODIURIL) 50 MG tablet Take 25 mg by mouth once daily       lisinopriL (ZESTRIL) 40 MG tablet Take 40 mg by mouth once daily       methotrexate (RHEUMATREX) 2.5 MG tablet Take by mouth       oxyCODONE-acetaminophen (PERCOCET) 10-325 mg tablet TAKE 1 TO 2 TABLETS BY MOUTH 4 TIMES DAILY       pantoprazole (PROTONIX) 40 MG DR tablet Take 40 mg by mouth once daily       potassium chloride (KLOR-CON M20) 20 MEQ ER tablet Take 20 mEq by mouth 2 (two) times daily       sertraline (ZOLOFT) 100 MG tablet         traZODone (DESYREL) 50 MG tablet Take 50 mg by mouth at bedtime       verapamiL (VERELAN) 360 MG SR capsule Take by mouth        No current facility-administered medications on file prior to visit.        Family History       Family History  Problem Relation Age of Onset   Hyperlipidemia (Elevated cholesterol) Mother     High blood pressure (Hypertension) Mother     Deep vein thrombosis (DVT or abnormal blood clot formation) Mother     Colon cancer Father     Diabetes Father          Tobacco Use History  Social  History        Tobacco Use  Smoking Status Former   Types: Cigarettes  Smokeless Tobacco Never        Social History  Social History         Socioeconomic History   Marital status: Single  Tobacco Use   Smoking status: Former      Types: Cigarettes   Smokeless tobacco: Never  Vaping Use   Vaping status: Never Used  Substance and Sexual Activity   Drug use: Never    Social Determinants of  Health      Received from [facility]    Social Network        Objective:         Vitals:      BP: 123/86  Pulse: 84  Temp: 36.4 C (97.6 F)  SpO2: 97%  Weight: (!) 146.5 kg (323 lb)  Height: 180.3 cm (5\' 11" )  PainSc: 0-No pain    Body mass index is 45.05 kg/m.   Gen:  No acute distress.  Well nourished and well groomed.   Neurological: Alert and oriented to person, place, and time. Coordination normal.  Head: Normocephalic and atraumatic.  Eyes: Conjunctivae are normal. Pupils are equal, round, and reactive to light. No scleral icterus.  Neck: Normal range of motion. Neck supple. No tracheal deviation or thyromegaly present.  Cardiovascular: Normal rate, regular rhythm, normal heart sounds and intact distal pulses.  Exam reveals no gallop and no friction rub.  No murmur heard. Breast: Ptosis bilaterally.  Faint tenderness and bruising laterally on the right breast.  No nipple retraction or nipple discharge.  No lymphadenopathy.  No skin dimpling.  No palpable masses.  Left breast also benign. Respiratory: Effort normal.  No respiratory distress. No chest wall tenderness. Breath sounds normal.  No wheezes, rales or rhonchi.  GI: Soft. Bowel sounds are normal. The abdomen is soft and nontender.  There is no rebound and no guarding.  Musculoskeletal: Normal range of motion. Extremities are nontender.  Lymphadenopathy: No cervical, preauricular, postauricular or axillary adenopathy is present Skin: Skin is warm and dry. No rash noted. No diaphoresis. No erythema. No  pallor. No clubbing, cyanosis, or edema.   Psychiatric: Normal mood and affect. Behavior is normal. Judgment and thought content normal.      Labs 08/14/22 essentially normal CBC, CMET, mag   Assessment and Plan:        ICD-10-CM    1. Malignant neoplasm of lower-outer quadrant of right breast of female, estrogen receptor positive (CMS/HHS-HCC)  C50.511 Ambulatory Referral to Oncology-Medical    Z17.0 Ambulatory Referral to Radiation Oncology      Ambulatory Referral to Cancer Genetics - [id number]     2. Cardiac sarcoidosis (HHS-HCC)  D86.85       3. Fibromyalgia  M79.7       4. Pulmonary hypertension (CMS/HHS-HCC)  I27.20       5. Bipolar 1 disorder (CMS/HHS-HCC)  F31.9       6. Current use of long term anticoagulation  Z79.01       7. History of pulmonary embolism  Z86.711       8. OSA (obstructive sleep apnea)  G47.33       9. Chronic systolic heart failure (CMS/HHS-HCC)  I50.22         Patient has a new diagnosis of stage 0 right lung cancer, hormone receptor positive.  I discussed possible treatment options.  Surgical options would definitely require holding methotrexate due to wound healing issues.  There also may be an option of trying antihormonal treatment and holding off on surgery with interval breast imaging.  The patient desires to have surgery if that is possible.  We will send clearance request to cardiology, pulmonology, and ophthalmology.  I would like to make sure that there are not severe consequences for holding her methotrexate.  If she were felt to be at life-threatening risk to be off of her immunosuppressants for 3 to 4 weeks, I would definitely lean towards a nonoperative option.   Assuming we  are able to be at low to medium risk for surgery, I would plan to do a seed localized lumpectomy.  I discussed this with the patient.  I reviewed that with her age this is typically followed by radiation and antihormonal treatment.  I did discuss mastectomy, but that  surgical risk is much higher and would require much longer time off methotrexate.  It would also require longer off of her Eliquis which would increase her clotting risk.  Surgery would be much longer to have unilateral or bilateral mastectomy.  Her risk of developing invasive cancer or metastatic cancer is low given her level of disease.  Given her higher risk with her medical history, I recommend against mastectomy.   I discussed surgery with the patient.  I reviewed expectations.  I reviewed seed placement preoperatively with the patient.  I discussed that she would need to hold her Eliquis for several days presuming that that is okay with cardiology.  I discussed risks of surgery including wound complications such as bleeding, infection, seroma, breast lymphedema, breast pain, alteration in breast contour.  I also discussed risks of heart and lung complications, blood clot, death.  I discussed risks of worsening sarcoid with holding her immunosuppression.   I reviewed postoperative restrictions such as no heavy lifting for at least a week.  I discussed no shower for 2 days and no swimming for 2 weeks.

## 2022-12-26 ENCOUNTER — Telehealth: Payer: Self-pay | Admitting: Pulmonary Disease

## 2022-12-26 NOTE — Telephone Encounter (Signed)
Returned call to patient regarding enoxaparin. She was nervous to administer but I was able to walk patient through self-administration over the phone. She successfully self-administered in right lower abdomen using PFS. Reviewed alternating injection sites to minimize risk for bruising. She verbalized understanding  Her surgery has been r/s for 12/28/2022 @ 3pm now  Chesley Mires, PharmD, MPH, BCPS, CPP Clinical Pharmacist (Rheumatology and Pulmonology)

## 2022-12-26 NOTE — Anesthesia Preprocedure Evaluation (Signed)
Anesthesia Evaluation  Patient identified by MRN, date of birth, ID band Patient awake    Reviewed: Allergy & Precautions, NPO status , Patient's Chart, lab work & pertinent test results  Airway Mallampati: II  TM Distance: >3 FB Neck ROM: Full    Dental  (+) Teeth Intact, Dental Advisory Given,    Pulmonary asthma ,  COPD inhaler, former smoker   Pulmonary exam normal        Cardiovascular hypertension, Pt. on medications  Rhythm:Regular Rate:Normal  sarcoidosis   Neuro/Psych   Anxiety Depression Bipolar Disorder   negative neurological ROS     GI/Hepatic Neg liver ROS,GERD  Medicated,,screening   Endo/Other  negative endocrine ROS    Renal/GU negative Renal ROS  negative genitourinary   Musculoskeletal  (+)  Fibromyalgia -  Abdominal  (+) + obese Abdomen: soft.   Peds  Hematology negative hematology ROS (+)   Anesthesia Other Findings   Reproductive/Obstetrics                             Anesthesia Physical Anesthesia Plan  ASA: 3  Anesthesia Plan: General   Post-op Pain Management: Ofirmev IV (intra-op)* and Toradol IV (intra-op)*   Induction: Intravenous  PONV Risk Score and Plan: 3 and Ondansetron, Dexamethasone and Treatment may vary due to age or medical condition  Airway Management Planned: LMA and Oral ETT  Additional Equipment: None  Intra-op Plan:   Post-operative Plan: Extubation in OR  Informed Consent: I have reviewed the patients History and Physical, chart, labs and discussed the procedure including the risks, benefits and alternatives for the proposed anesthesia with the patient or authorized representative who has indicated his/her understanding and acceptance.     Dental advisory given  Plan Discussed with: CRNA and Anesthesiologist  Anesthesia Plan Comments: (PAT note by Antionette Poles, PA-C: 57 year old female with pertinent history including HTN,  pulmonary hypertension, PE (03/2022), COPD, OSA on BiPAP, sarcoidosis, bipolar 1 disorder, moderately reduced RV function.  She is followed by ophthalmology and rheumatology for sarcoid/uveitis and is maintained on methotrexate.  Recently seen by cardiologist Dr. Gasper Lloyd 10/25/2022 for evaluation of potential cardiac sarcoidosis.  Per note, due to severity of her sarcoid, management largely deferred to rheumatology.  Regarding pulmonary hypertension and upcoming surgery, "2. Pulmonary hypertension  - Likely due to a combination of HFpEF/OHS/sarcoid  - Will plan on RHC in the future. Averse to procedures. 3. OSA - Bipap + O2 at night. 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  - From a cardiac standpoint, she is low risk to proceed with excision."  Recent episode of bronchitis dx'd 12/08/22. Seen in followup by pulmonology APP Rubye Oaks, NP on 12/19/2022.  Per note, "From a pulmonary standpoint CT chest February 2024 showed minimal nodular infiltrates with no significant adenopathy.  Needs repeat PFTs pending those results we will decide on high-resolution CT chest to evaluate for possible parenchymal involvement.  Patient with recent bronchitic flare with improvement with steroids.  Cardiac notes indicate active inflammation.  Ophthalmology notes recommend continuing on methotrexate with stable condition. We discussed beginning low-dose prednisone for short period.  After review of her medical records.  Will continue for 2 weeks and then discontinue.  20 mg daily for 1 week and then 10 mg daily for 1 week and stop.  No recommendations for additional immunosuppression. Patient aware to stop Prednisone  after 2 weeks . Check Chest xray on return or decide if HRCT is indicated. Patient is to continue follow-up with rheumatology."  At PAT appointment patient reported feeling much better after recent bronchitis flare, completed antibiotics  and is still on prednisone.  Minimal symptoms at this time.  I advised her I will call to follow-up next week to make sure this is still the case.  I did call and speak with the pt on 12/25/22 and she stated she continues to feel well from a respiratory standpoint. Says she is currently at her baseline, denies cough or SOB. Case discussed with Dr. Chaney Malling, advised okay to proceed as planned barring acute status change.   LD Eliquis 12/25/22. She is on Lovenox bridge.   Preop labs reviewed, unremarkable.  EKG 12/08/2022: NSR.  Rate 83.  Cardiac MRI 06/26/2022: IMPRESSION: 1. Significant artifact throughout study due to implanted loop recorder  2. Small LV size, mild hypertrophy, and normal systolic function (EF 65%)  3.  Normal RV size and systolic function (EF 58%)  4. Basal septal midwall LGE, which is a scar pattern seen in nonischemic cardiomyopathies and associated with a worse prognosis  5. RV insertion site LGE, which is a nonspecific scar pattern often seen in setting of elevated pulmonary pressures  TTE 04/25/22: 1. Left ventricular ejection fraction, by estimation, is 65 to 70%. The  left ventricle has hyperdynamic function. The left ventricle has no  regional wall motion abnormalities. There is mild concentric left  ventricular hypertrophy. Left ventricular  diastolic parameters were normal.  2. Right ventricular systolic function is moderately reduced. The right  ventricular size is mildly enlarged. Tricuspid regurgitation signal is  inadequate for assessing PA pressure.  3. Left atrial size was mildly dilated.  4. Right atrial size was mildly dilated.  5. The mitral valve is normal in structure. No evidence of mitral valve  regurgitation.  6. The aortic valve is tricuspid. There is mild thickening of the aortic  valve. Aortic valve regurgitation is not visualized. Aortic valve  sclerosis is present, with no evidence of aortic valve stenosis.  7. The  inferior vena cava is dilated in size with >50% respiratory  variability, suggesting right atrial pressure of 8 mmHg.  8. Agitated saline contrast bubble study was negative, with no evidence  of any interatrial shunt.   Comparison(s): Prior images reviewed side by side. The right ventricular  systolic function is significantly worse.    )        Anesthesia Quick Evaluation

## 2022-12-26 NOTE — Telephone Encounter (Signed)
Pt has question regarding injection she is supposed to give herself prior to breast surgery on 12/28/22

## 2022-12-26 NOTE — Telephone Encounter (Signed)
Pt calling in with questions about her injection she is supposed to take today

## 2022-12-27 ENCOUNTER — Ambulatory Visit
Admission: RE | Admit: 2022-12-27 | Discharge: 2022-12-27 | Disposition: A | Discharge status: Home / Self Care | Payer: Medicare HMO | Source: Ambulatory Visit | Attending: General Surgery | Admitting: General Surgery

## 2022-12-27 DIAGNOSIS — C50511 Malignant neoplasm of lower-outer quadrant of right female breast: Secondary | ICD-10-CM

## 2022-12-27 HISTORY — PX: BREAST BIOPSY: SHX20

## 2022-12-27 NOTE — Telephone Encounter (Signed)
Delayed message - Spoke with patient yesterday afternoon. No issues self-administering enoxaparin at this time  Chesley Mires, PharmD, MPH, BCPS, CPP Clinical Pharmacist (Rheumatology and Pulmonology)

## 2022-12-28 ENCOUNTER — Encounter (HOSPITAL_COMMUNITY)
Admission: RE | Disposition: A | Discharge status: Home / Self Care | Payer: Self-pay | Source: Ambulatory Visit | Attending: General Surgery

## 2022-12-28 ENCOUNTER — Other Ambulatory Visit: Payer: Self-pay

## 2022-12-28 ENCOUNTER — Ambulatory Visit (HOSPITAL_COMMUNITY): Payer: Medicare HMO | Admitting: Vascular Surgery

## 2022-12-28 ENCOUNTER — Encounter (HOSPITAL_COMMUNITY): Payer: Self-pay | Admitting: General Surgery

## 2022-12-28 ENCOUNTER — Ambulatory Visit
Admission: RE | Admit: 2022-12-28 | Discharge: 2022-12-28 | Disposition: A | Discharge status: Home / Self Care | Payer: Medicare HMO | Source: Ambulatory Visit | Attending: General Surgery | Admitting: General Surgery

## 2022-12-28 ENCOUNTER — Ambulatory Visit (HOSPITAL_BASED_OUTPATIENT_CLINIC_OR_DEPARTMENT_OTHER): Payer: Medicare HMO | Admitting: Vascular Surgery

## 2022-12-28 ENCOUNTER — Ambulatory Visit (HOSPITAL_COMMUNITY)
Admission: RE | Admit: 2022-12-28 | Discharge: 2022-12-28 | Disposition: A | Discharge status: Home / Self Care | Payer: Medicare HMO | Source: Ambulatory Visit | Attending: General Surgery | Admitting: General Surgery

## 2022-12-28 DIAGNOSIS — C50511 Malignant neoplasm of lower-outer quadrant of right female breast: Secondary | ICD-10-CM | POA: Insufficient documentation

## 2022-12-28 DIAGNOSIS — Z9851 Tubal ligation status: Secondary | ICD-10-CM | POA: Insufficient documentation

## 2022-12-28 DIAGNOSIS — Z170 Estrogen receptor positive status [ER+]: Secondary | ICD-10-CM

## 2022-12-28 DIAGNOSIS — M797 Fibromyalgia: Secondary | ICD-10-CM | POA: Insufficient documentation

## 2022-12-28 DIAGNOSIS — I10 Essential (primary) hypertension: Secondary | ICD-10-CM | POA: Diagnosis not present

## 2022-12-28 DIAGNOSIS — Z8249 Family history of ischemic heart disease and other diseases of the circulatory system: Secondary | ICD-10-CM | POA: Insufficient documentation

## 2022-12-28 DIAGNOSIS — Z98891 History of uterine scar from previous surgery: Secondary | ICD-10-CM | POA: Diagnosis not present

## 2022-12-28 DIAGNOSIS — Z832 Family history of diseases of the blood and blood-forming organs and certain disorders involving the immune mechanism: Secondary | ICD-10-CM | POA: Insufficient documentation

## 2022-12-28 DIAGNOSIS — G4733 Obstructive sleep apnea (adult) (pediatric): Secondary | ICD-10-CM | POA: Diagnosis not present

## 2022-12-28 DIAGNOSIS — I272 Pulmonary hypertension, unspecified: Secondary | ICD-10-CM | POA: Diagnosis not present

## 2022-12-28 DIAGNOSIS — Z86711 Personal history of pulmonary embolism: Secondary | ICD-10-CM | POA: Diagnosis not present

## 2022-12-28 DIAGNOSIS — D8685 Sarcoid myocarditis: Secondary | ICD-10-CM | POA: Insufficient documentation

## 2022-12-28 DIAGNOSIS — C50911 Malignant neoplasm of unspecified site of right female breast: Secondary | ICD-10-CM

## 2022-12-28 DIAGNOSIS — Z86718 Personal history of other venous thrombosis and embolism: Secondary | ICD-10-CM | POA: Diagnosis not present

## 2022-12-28 DIAGNOSIS — I110 Hypertensive heart disease with heart failure: Secondary | ICD-10-CM | POA: Insufficient documentation

## 2022-12-28 DIAGNOSIS — Z7901 Long term (current) use of anticoagulants: Secondary | ICD-10-CM | POA: Diagnosis not present

## 2022-12-28 DIAGNOSIS — I5022 Chronic systolic (congestive) heart failure: Secondary | ICD-10-CM | POA: Diagnosis not present

## 2022-12-28 DIAGNOSIS — Z803 Family history of malignant neoplasm of breast: Secondary | ICD-10-CM | POA: Diagnosis not present

## 2022-12-28 DIAGNOSIS — Z800 Family history of malignant neoplasm of digestive organs: Secondary | ICD-10-CM | POA: Insufficient documentation

## 2022-12-28 DIAGNOSIS — Z87891 Personal history of nicotine dependence: Secondary | ICD-10-CM | POA: Diagnosis not present

## 2022-12-28 HISTORY — PX: BREAST LUMPECTOMY WITH RADIOACTIVE SEED LOCALIZATION: SHX6424

## 2022-12-28 SURGERY — BREAST LUMPECTOMY WITH RADIOACTIVE SEED LOCALIZATION
Anesthesia: General | Site: Breast | Laterality: Right

## 2022-12-28 MED ORDER — CEFAZOLIN SODIUM-DEXTROSE 2-4 GM/100ML-% IV SOLN: 2.0000 g | INTRAVENOUS | Status: DC

## 2022-12-28 MED ORDER — FENTANYL CITRATE (PF) 250 MCG/5ML IJ SOLN
INTRAMUSCULAR | Status: DC | PRN
  Administered 2022-12-28: 100 ug via INTRAVENOUS

## 2022-12-28 MED ORDER — SUCCINYLCHOLINE CHLORIDE 200 MG/10ML IV SOSY
PREFILLED_SYRINGE | INTRAVENOUS | Status: AC
  Filled 2022-12-28: qty 10

## 2022-12-28 MED ORDER — LABETALOL HCL 5 MG/ML IV SOLN
INTRAVENOUS | Status: DC | PRN
  Administered 2022-12-28: 5 mg via INTRAVENOUS
  Administered 2022-12-28: 10 mg via INTRAVENOUS

## 2022-12-28 MED ORDER — MIDAZOLAM HCL 2 MG/2ML IJ SOLN
INTRAMUSCULAR | Status: AC
  Filled 2022-12-28: qty 2

## 2022-12-28 MED ORDER — CEFAZOLIN IN SODIUM CHLORIDE 3-0.9 GM/100ML-% IV SOLN
3.0000 g | INTRAVENOUS | Status: DC
  Filled 2022-12-28: qty 100

## 2022-12-28 MED ORDER — KETOROLAC TROMETHAMINE 30 MG/ML IJ SOLN
INTRAMUSCULAR | Status: AC
  Filled 2022-12-28: qty 1

## 2022-12-28 MED ORDER — LACTATED RINGERS IV SOLN: INTRAVENOUS | Status: DC

## 2022-12-28 MED ORDER — LIDOCAINE 2% (20 MG/ML) 5 ML SYRINGE
INTRAMUSCULAR | Status: DC | PRN
  Administered 2022-12-28: 100 mg via INTRAVENOUS

## 2022-12-28 MED ORDER — SUGAMMADEX SODIUM 200 MG/2ML IV SOLN
INTRAVENOUS | Status: DC | PRN
  Administered 2022-12-28: 300 mg via INTRAVENOUS

## 2022-12-28 MED ORDER — ROCURONIUM BROMIDE 10 MG/ML (PF) SYRINGE
PREFILLED_SYRINGE | INTRAVENOUS | Status: AC
  Filled 2022-12-28: qty 10

## 2022-12-28 MED ORDER — ONDANSETRON HCL 4 MG/2ML IJ SOLN
INTRAMUSCULAR | Status: DC | PRN
  Administered 2022-12-28: 4 mg via INTRAVENOUS

## 2022-12-28 MED ORDER — CHLORHEXIDINE GLUCONATE CLOTH 2 % EX PADS: 6.0000 | MEDICATED_PAD | Freq: Once | CUTANEOUS | Status: DC

## 2022-12-28 MED ORDER — ACETAMINOPHEN 325 MG PO TABS: 325.0000 mg | ORAL_TABLET | ORAL | Status: DC | PRN

## 2022-12-28 MED ORDER — ACETAMINOPHEN 500 MG PO TABS: 1000.0000 mg | ORAL_TABLET | ORAL | Status: DC

## 2022-12-28 MED ORDER — MIDAZOLAM HCL 2 MG/2ML IJ SOLN
INTRAMUSCULAR | Status: DC | PRN
  Administered 2022-12-28: 2 mg via INTRAVENOUS

## 2022-12-28 MED ORDER — CHLORHEXIDINE GLUCONATE 0.12 % MT SOLN
15.0000 mL | Freq: Once | OROMUCOSAL | Status: AC
Start: 2022-12-28 — End: 2022-12-28
  Administered 2022-12-28: 15 mL via OROMUCOSAL
  Filled 2022-12-28: qty 15

## 2022-12-28 MED ORDER — ACETAMINOPHEN 160 MG/5ML PO SOLN: 325.0000 mg | ORAL | Status: DC | PRN

## 2022-12-28 MED ORDER — OXYCODONE HCL 5 MG PO TABS: 5.0000 mg | ORAL_TABLET | Freq: Four times a day (QID) | ORAL | 0 refills | Status: DC | PRN

## 2022-12-28 MED ORDER — LIDOCAINE HCL 1 % IJ SOLN
INTRAMUSCULAR | Status: AC
  Filled 2022-12-28: qty 20

## 2022-12-28 MED ORDER — PROPOFOL 10 MG/ML IV BOLUS
INTRAVENOUS | Status: DC | PRN
  Administered 2022-12-28: 200 mg via INTRAVENOUS

## 2022-12-28 MED ORDER — SUCCINYLCHOLINE CHLORIDE 200 MG/10ML IV SOSY
PREFILLED_SYRINGE | INTRAVENOUS | Status: DC | PRN
  Administered 2022-12-28: 200 mg via INTRAVENOUS

## 2022-12-28 MED ORDER — PROPOFOL 10 MG/ML IV BOLUS
INTRAVENOUS | Status: AC
  Filled 2022-12-28: qty 20

## 2022-12-28 MED ORDER — DEXTROSE 5 % IV SOLN
INTRAVENOUS | Status: DC | PRN
  Administered 2022-12-28: 3 g via INTRAVENOUS

## 2022-12-28 MED ORDER — ROCURONIUM BROMIDE 10 MG/ML (PF) SYRINGE
PREFILLED_SYRINGE | INTRAVENOUS | Status: DC | PRN
  Administered 2022-12-28: 30 mg via INTRAVENOUS

## 2022-12-28 MED ORDER — DEXMEDETOMIDINE HCL IN NACL 80 MCG/20ML IV SOLN
INTRAVENOUS | Status: DC | PRN
  Administered 2022-12-28: 8 ug via INTRAVENOUS
  Administered 2022-12-28: 4 ug via INTRAVENOUS
  Administered 2022-12-28: 8 ug via INTRAVENOUS

## 2022-12-28 MED ORDER — LIDOCAINE 2% (20 MG/ML) 5 ML SYRINGE
INTRAMUSCULAR | Status: AC
  Filled 2022-12-28: qty 5

## 2022-12-28 MED ORDER — DEXAMETHASONE SODIUM PHOSPHATE 10 MG/ML IJ SOLN
INTRAMUSCULAR | Status: AC
  Filled 2022-12-28: qty 1

## 2022-12-28 MED ORDER — MEPERIDINE HCL 25 MG/ML IJ SOLN: 6.2500 mg | INTRAMUSCULAR | Status: DC | PRN

## 2022-12-28 MED ORDER — KETOROLAC TROMETHAMINE 30 MG/ML IJ SOLN
INTRAMUSCULAR | Status: DC | PRN
  Administered 2022-12-28: 30 mg via INTRAVENOUS

## 2022-12-28 MED ORDER — 0.9 % SODIUM CHLORIDE (POUR BTL) OPTIME
TOPICAL | Status: DC | PRN
  Administered 2022-12-28: 1000 mL

## 2022-12-28 MED ORDER — FENTANYL CITRATE (PF) 100 MCG/2ML IJ SOLN: 25.0000 ug | INTRAMUSCULAR | Status: DC | PRN

## 2022-12-28 MED ORDER — OXYCODONE HCL 5 MG PO TABS: 5.0000 mg | ORAL_TABLET | Freq: Once | ORAL | Status: DC | PRN

## 2022-12-28 MED ORDER — ONDANSETRON HCL 4 MG/2ML IJ SOLN
INTRAMUSCULAR | Status: AC
  Filled 2022-12-28: qty 2

## 2022-12-28 MED ORDER — ORAL CARE MOUTH RINSE: 15.0000 mL | Freq: Once | OROMUCOSAL | Status: AC

## 2022-12-28 MED ORDER — DEXMEDETOMIDINE HCL IN NACL 80 MCG/20ML IV SOLN
INTRAVENOUS | Status: AC
  Filled 2022-12-28: qty 20

## 2022-12-28 MED ORDER — OXYCODONE HCL 5 MG/5ML PO SOLN: 5.0000 mg | Freq: Once | ORAL | Status: DC | PRN

## 2022-12-28 MED ORDER — LIDOCAINE HCL 1 % IJ SOLN
INTRAMUSCULAR | Status: DC | PRN
  Administered 2022-12-28: 40 mL via INTRAMUSCULAR

## 2022-12-28 MED ORDER — DEXAMETHASONE SODIUM PHOSPHATE 10 MG/ML IJ SOLN
INTRAMUSCULAR | Status: DC | PRN
  Administered 2022-12-28: 10 mg via INTRAVENOUS

## 2022-12-28 MED ORDER — BUPIVACAINE-EPINEPHRINE (PF) 0.25% -1:200000 IJ SOLN
INTRAMUSCULAR | Status: AC
  Filled 2022-12-28: qty 30

## 2022-12-28 MED ORDER — FENTANYL CITRATE (PF) 250 MCG/5ML IJ SOLN
INTRAMUSCULAR | Status: AC
  Filled 2022-12-28: qty 5

## 2022-12-28 MED ORDER — LABETALOL HCL 5 MG/ML IV SOLN
INTRAVENOUS | Status: AC
  Filled 2022-12-28: qty 4

## 2022-12-28 SURGICAL SUPPLY — 43 items
ADH SKN CLS APL DERMABOND .7 (GAUZE/BANDAGES/DRESSINGS) ×1
APL PRP STRL LF DISP 70% ISPRP (MISCELLANEOUS) ×1
BAG COUNTER SPONGE SURGICOUNT (BAG) ×1 IMPLANT
BAG SPNG CNTER NS LX DISP (BAG) ×1
BINDER BREAST 3XL (GAUZE/BANDAGES/DRESSINGS) IMPLANT
BINDER BREAST LRG (GAUZE/BANDAGES/DRESSINGS) IMPLANT
BINDER BREAST XLRG (GAUZE/BANDAGES/DRESSINGS) IMPLANT
CANISTER SUCT 3000ML PPV (MISCELLANEOUS) IMPLANT
CHLORAPREP W/TINT 26 (MISCELLANEOUS) ×1 IMPLANT
CLIP TI LARGE 6 (CLIP) ×1 IMPLANT
COVER PROBE W GEL 5X96 (DRAPES) ×1 IMPLANT
COVER SURGICAL LIGHT HANDLE (MISCELLANEOUS) ×1 IMPLANT
DERMABOND ADVANCED .7 DNX12 (GAUZE/BANDAGES/DRESSINGS) ×1 IMPLANT
DEVICE DUBIN SPECIMEN MAMMOGRA (MISCELLANEOUS) ×1 IMPLANT
DRAPE CHEST BREAST 15X10 FENES (DRAPES) ×1 IMPLANT
ELECT COATED BLADE 2.86 ST (ELECTRODE) ×1 IMPLANT
ELECT REM PT RETURN 9FT ADLT (ELECTROSURGICAL) ×1
ELECTRODE REM PT RTRN 9FT ADLT (ELECTROSURGICAL) ×1 IMPLANT
GAUZE PAD ABD 8X10 STRL (GAUZE/BANDAGES/DRESSINGS) ×1 IMPLANT
GAUZE SPONGE 4X4 12PLY STRL LF (GAUZE/BANDAGES/DRESSINGS) ×1 IMPLANT
GLOVE BIO SURGEON STRL SZ 6 (GLOVE) ×1 IMPLANT
GLOVE INDICATOR 6.5 STRL GRN (GLOVE) ×1 IMPLANT
GOWN STRL REUS W/ TWL LRG LVL3 (GOWN DISPOSABLE) ×1 IMPLANT
GOWN STRL REUS W/ TWL XL LVL3 (GOWN DISPOSABLE) ×1 IMPLANT
GOWN STRL REUS W/TWL LRG LVL3 (GOWN DISPOSABLE) ×1
GOWN STRL REUS W/TWL XL LVL3 (GOWN DISPOSABLE) ×1
KIT BASIN OR (CUSTOM PROCEDURE TRAY) ×1 IMPLANT
KIT MARKER MARGIN INK (KITS) ×1 IMPLANT
LIGHT WAVEGUIDE WIDE FLAT (MISCELLANEOUS) IMPLANT
NDL HYPO 25GX1X1/2 BEV (NEEDLE) ×1 IMPLANT
NEEDLE HYPO 25GX1X1/2 BEV (NEEDLE) ×1
NS IRRIG 1000ML POUR BTL (IV SOLUTION) IMPLANT
PACK GENERAL/GYN (CUSTOM PROCEDURE TRAY) ×1 IMPLANT
STRIP CLOSURE SKIN 1/2X4 (GAUZE/BANDAGES/DRESSINGS) ×1 IMPLANT
SUT MNCRL AB 4-0 PS2 18 (SUTURE) ×1 IMPLANT
SUT SILK 2 0 SH (SUTURE) IMPLANT
SUT VIC AB 2-0 SH 27 (SUTURE)
SUT VIC AB 2-0 SH 27XBRD (SUTURE) IMPLANT
SUT VIC AB 3-0 SH 27 (SUTURE) ×1
SUT VIC AB 3-0 SH 27X BRD (SUTURE) ×1 IMPLANT
SYR CONTROL 10ML LL (SYRINGE) ×1 IMPLANT
TOWEL GREEN STERILE (TOWEL DISPOSABLE) ×1 IMPLANT
TOWEL GREEN STERILE FF (TOWEL DISPOSABLE) ×1 IMPLANT

## 2022-12-28 NOTE — Transfer of Care (Signed)
Immediate Anesthesia Transfer of Care Note  Patient: ANTOINETTA ZIGLAR  Procedure(s) Performed: RIGHT BREAST SEED LOCALIZED LUMPECTOMY (Right: Breast)  Patient Location: PACU  Anesthesia Type:General  Level of Consciousness: awake, alert , and oriented  Airway & Oxygen Therapy: Patient Spontanous Breathing and Patient connected to face mask oxygen  Post-op Assessment: Report given to RN and Post -op Vital signs reviewed and stable  Post vital signs: stable  Last Vitals:  Vitals Value Taken Time  BP 119/84 12/28/22 1340  Temp 36.5 C 12/28/22 1340  Pulse 83 12/28/22 1345  Resp 24 12/28/22 1345  SpO2 92 % 12/28/22 1345  Vitals shown include unfiled device data.  Last Pain:  Vitals:   12/28/22 1108  TempSrc:   PainSc: 7       Patients Stated Pain Goal: 2 (12/28/22 1108)  Complications: No notable events documented.

## 2022-12-28 NOTE — Anesthesia Procedure Notes (Addendum)
Procedure Name: Intubation Date/Time: 12/28/2022 12:11 PM  Performed by: Heron Sabins, CRNAPre-anesthesia Checklist: Patient identified, Emergency Drugs available, Suction available and Patient being monitored Patient Re-evaluated:Patient Re-evaluated prior to induction Oxygen Delivery Method: Circle System Utilized Preoxygenation: Pre-oxygenation with 100% oxygen Induction Type: IV induction Ventilation: Mask ventilation without difficulty Laryngoscope Size: Mac and 3 Grade View: Grade II Tube type: Oral Number of attempts: 1 Airway Equipment and Method: Stylet and Oral airway Placement Confirmation: ETT inserted through vocal cords under direct vision, positive ETCO2 and breath sounds checked- equal and bilateral Tube secured with: Tape Dental Injury: Teeth and Oropharynx as per pre-operative assessment

## 2022-12-28 NOTE — Anesthesia Postprocedure Evaluation (Signed)
Anesthesia Post Note  Patient: Julie Cruz  Procedure(s) Performed: RIGHT BREAST SEED LOCALIZED LUMPECTOMY (Right: Breast)     Patient location during evaluation: PACU Anesthesia Type: General Level of consciousness: awake and alert Pain management: pain level controlled Vital Signs Assessment: post-procedure vital signs reviewed and stable Respiratory status: spontaneous breathing, nonlabored ventilation, respiratory function stable and patient connected to nasal cannula oxygen Cardiovascular status: blood pressure returned to baseline and stable Postop Assessment: no apparent nausea or vomiting Anesthetic complications: no  No notable events documented.  Last Vitals:  Vitals:   12/28/22 1415 12/28/22 1430  BP: 117/79 134/86  Pulse: 68 72  Resp: 14 17  Temp:    SpO2: 95% 92%    Last Pain:  Vitals:   12/28/22 1340  TempSrc:   PainSc: Asleep                 [name]

## 2022-12-28 NOTE — Op Note (Signed)
Right Breast Radioactive seed localized lumpectomy  Indications: This patient presents with history of right breast cancer, lower outer quadrant, intermediate grade DCIS,  receptors +/+  Pre-operative Diagnosis: right breast cancer, stage 0  Post-operative Diagnosis: Same  Surgeon: Almond Lint   Anesthesia: General endotracheal anesthesia  ASA Class: 3  Procedure Details  The patient was seen in the Holding Room. The risks, benefits, complications, treatment options, and expected outcomes were discussed with the patient. The possibilities of bleeding, infection, the need for additional procedures, failure to diagnose a condition, and creating a complication requiring other procedures or operations were discussed with the patient. The patient concurred with the proposed plan, giving informed consent.  The site of surgery properly noted/marked. The patient was taken to Operating Room # 8, identified, and the procedure verified as right breast seed localized lumpectomy.  The right breast and chest were prepped and draped in standard fashion. A lateral incision was made near the previously placed radioactive seed.  Dissection was carried down around the point of maximum signal intensity. The cautery was used to perform the dissection.   The specimen was inked with the margin marker paint kit.    Specimen radiography confirmed inclusion of the mammographic lesion, the clip, and the seed.  The background signal in the breast was zero. Additional margins were taken at all the cardinal directions.  Hemostasis was achieved with cautery.  The cavity was marked with clips on each border other than the anterior border.  The wound was irrigated and closed with 3-0 vicryl interrupted deep dermal sutures and 4-0 monocryl running subcuticular suture.      Sterile dressings were applied. At the end of the operation, all sponge, instrument, and needle counts were correct.   Findings: Seed, clip in specimen.   Skin is anterior margin.    Estimated Blood Loss:  min         Specimens: right breast tissue with seed, additional superior, medial, inferior, lateral, anterior, and posterior margins.           Complications:  None; patient tolerated the procedure well.         Disposition: PACU - hemodynamically stable.         Condition: stable

## 2022-12-28 NOTE — Discharge Instructions (Addendum)

## 2022-12-28 NOTE — Interval H&P Note (Signed)
History and Physical Interval Note:  12/28/2022 11:26 AM  Julie Cruz  has presented today for surgery, with the diagnosis of RIGHT BREAST CANCER.  The various methods of treatment have been discussed with the patient and family. After consideration of risks, benefits and other options for treatment, the patient has consented to  Procedure(s): RIGHT BREAST SEED LOCALIZED LUMPECTOMY (Right) as a surgical intervention.  The patient's history has been reviewed, patient examined, no change in status, stable for surgery.  I have reviewed the patient's chart and labs.  Questions were answered to the patient's satisfaction.     Almond Lint

## 2022-12-29 ENCOUNTER — Telehealth: Payer: Self-pay | Admitting: Pulmonary Disease

## 2022-12-29 ENCOUNTER — Encounter (HOSPITAL_COMMUNITY): Payer: Self-pay | Admitting: General Surgery

## 2022-12-29 ENCOUNTER — Ambulatory Visit: Payer: Medicare HMO | Admitting: Adult Health

## 2022-12-29 NOTE — Telephone Encounter (Signed)
Patient was seen by Tammy on 12/19/22  atient had breast cancer surgery yesterday. She is wanting to know if she can go back on her elloquis. She is concerned as she has been off of it for a few days now. Please advise.   Tammy can you please advise .  Thank you

## 2022-12-29 NOTE — Telephone Encounter (Signed)
I spoke with patient and instructed her to resume her eliquis.   Nothing futher needed.  JD

## 2022-12-29 NOTE — Telephone Encounter (Signed)
According to Dr. Francine Graven she was to do Lovenox bridge. Did she not start this ? Sent to Dr Francine Graven as well.

## 2023-01-03 ENCOUNTER — Encounter: Payer: Self-pay | Admitting: *Deleted

## 2023-01-04 LAB — SURGICAL PATHOLOGY

## 2023-01-05 ENCOUNTER — Other Ambulatory Visit: Payer: Self-pay | Admitting: General Surgery

## 2023-01-05 ENCOUNTER — Telehealth: Payer: Self-pay | Admitting: General Surgery

## 2023-01-05 NOTE — Telephone Encounter (Signed)
Discussed path with daughter [place].  Reviewed need for reexcision. I am also discussing +/- SLN with Dr. Pamelia Hoit given that there was invasive cancer present.  This was only 1.5 mm of invasive cancer, so we might not need to do this. Daughter will ask patient to call Monday.

## 2023-01-08 ENCOUNTER — Telehealth: Payer: Self-pay | Admitting: Pulmonary Disease

## 2023-01-08 DIAGNOSIS — I2699 Other pulmonary embolism without acute cor pulmonale: Secondary | ICD-10-CM

## 2023-01-08 NOTE — Telephone Encounter (Signed)
Julie Cruz from [facility] wanted Dr.Dewald to know that the patient needs a hold on eliqus with lovenox bridge for re-exesion for positive margins for breast surgery.

## 2023-01-11 ENCOUNTER — Encounter: Payer: Self-pay | Admitting: *Deleted

## 2023-01-11 ENCOUNTER — Inpatient Hospital Stay: Payer: Medicare HMO | Attending: Hematology and Oncology | Admitting: Adult Health

## 2023-01-11 DIAGNOSIS — D0511 Intraductal carcinoma in situ of right breast: Secondary | ICD-10-CM

## 2023-01-12 IMAGING — DX DG CHEST 2V
2 series · 2 of 2 positions shown · non-contrast
Comparison: None.

CLINICAL DATA: Chest pain, shortness of breath

EXAM:
CHEST - 2 VIEW

[chest pa]
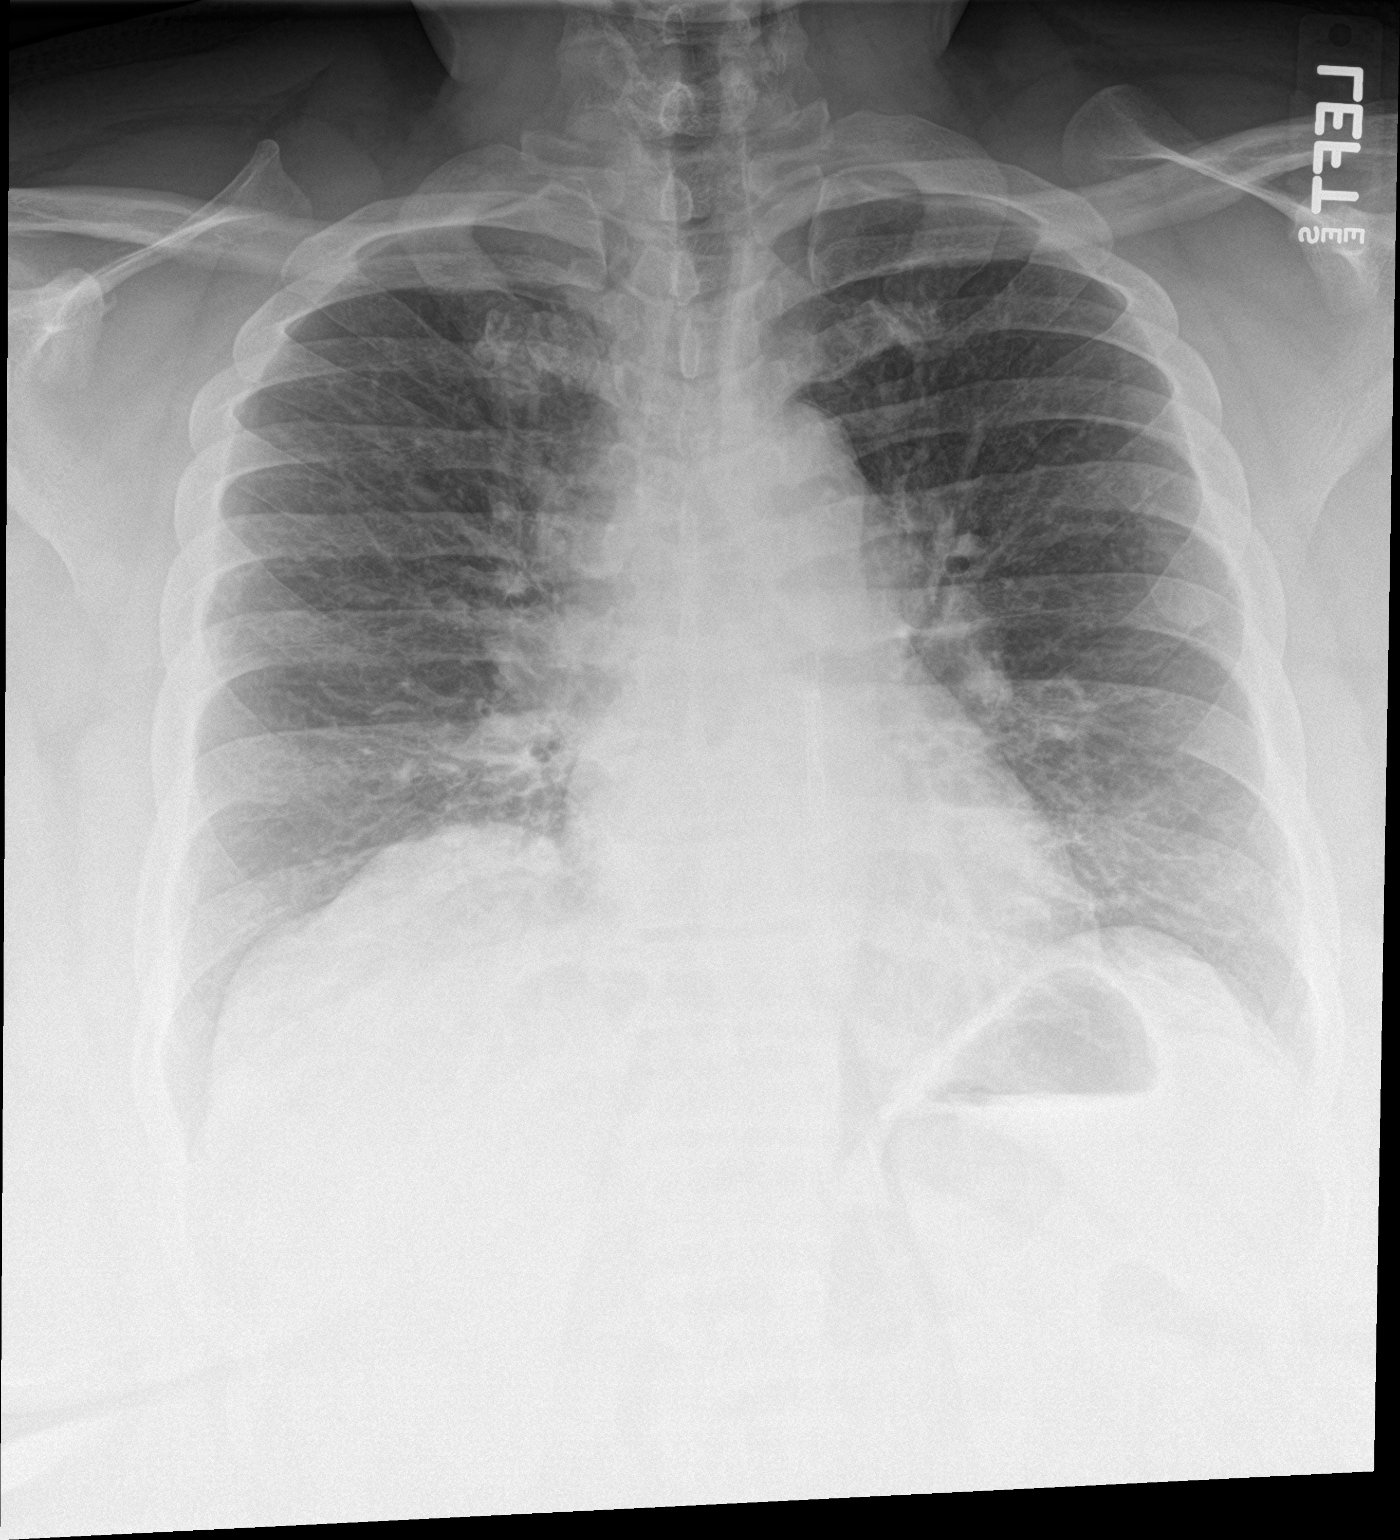

[chest lat]
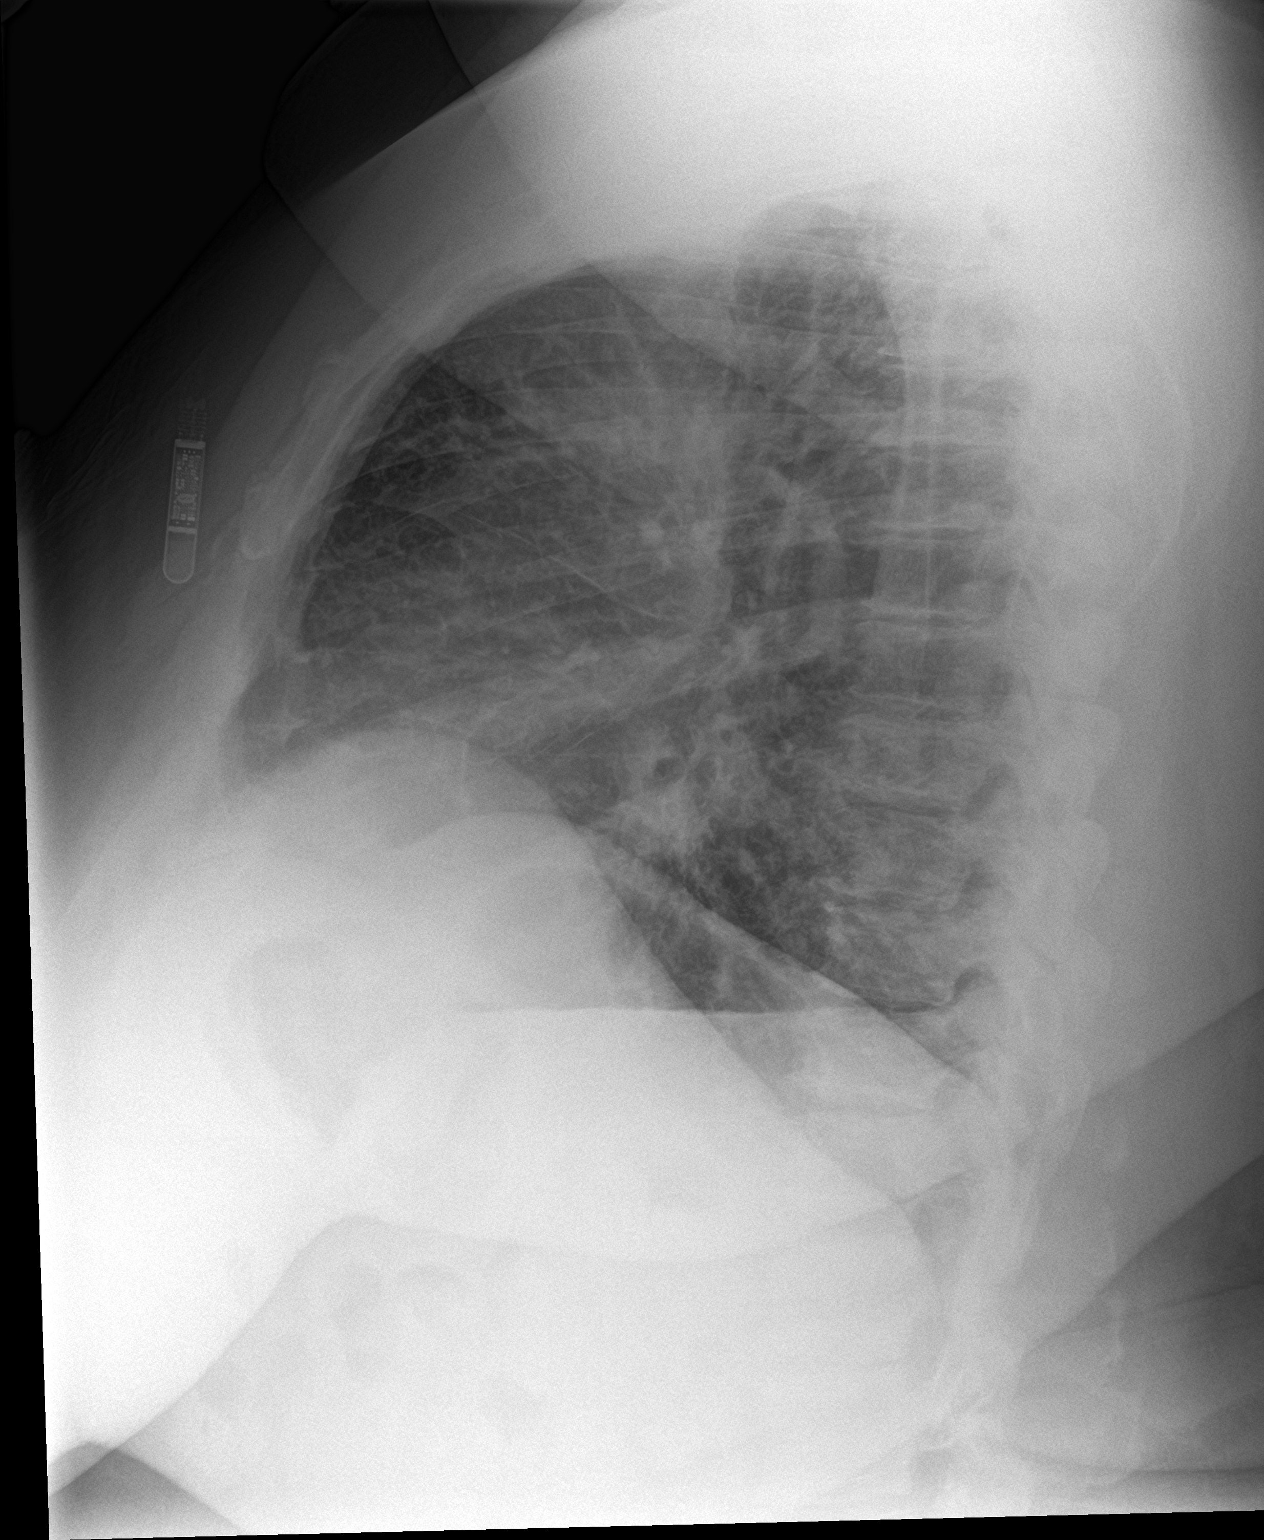

[2 of 2 positions shown; findings below may reference images not displayed]

FINDINGS: Lungs are clear.  No pleural effusion or pneumothorax.

The heart is normal in size.

Visualized osseous structures are within normal limits.
IMPRESSION: Normal chest radiographs.

## 2023-01-12 IMAGING — CT CT HEAD W/O CM
3 of 4 series · 15 of 47 positions shown, 18 images · non-contrast
Comparison: None.

CLINICAL DATA: Dizziness, nonspecific



[Series 3: head 5.0 h30s · axial · 0.45mm/px · z∈[-479,-334]mm · 9 of 35 slices shown, 12 images]
[im 3/35  brain]
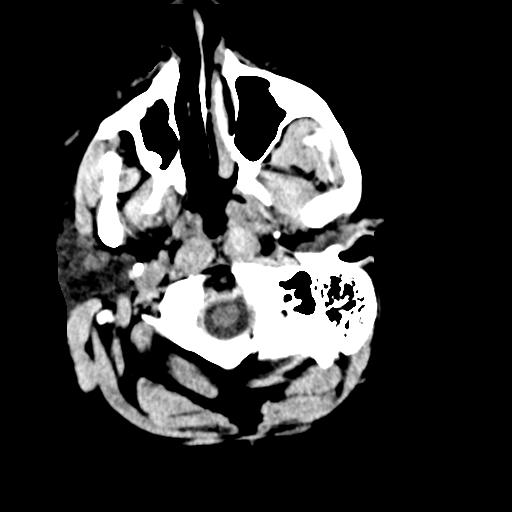
[im 3/35  bone]
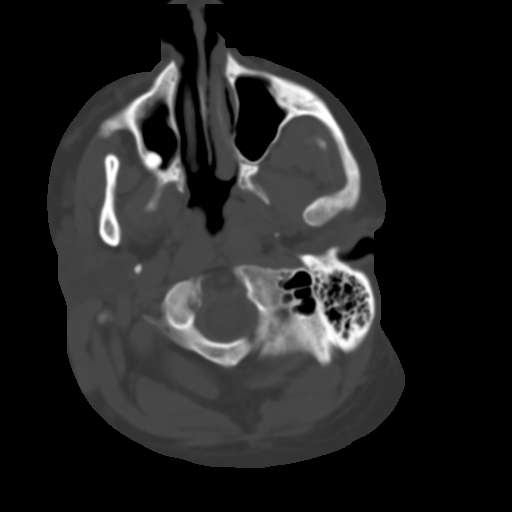
[im 8/35  brain]
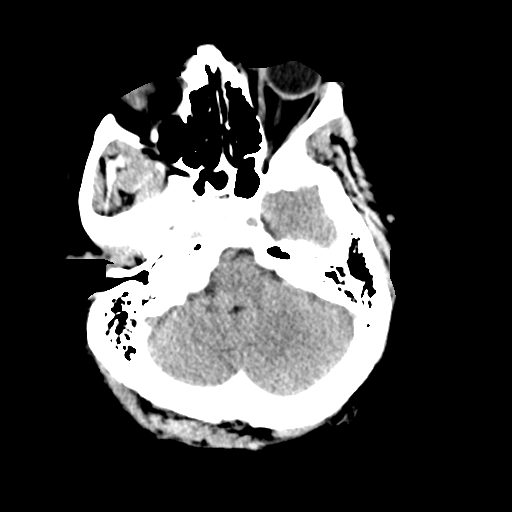
[im 10/35  brain]
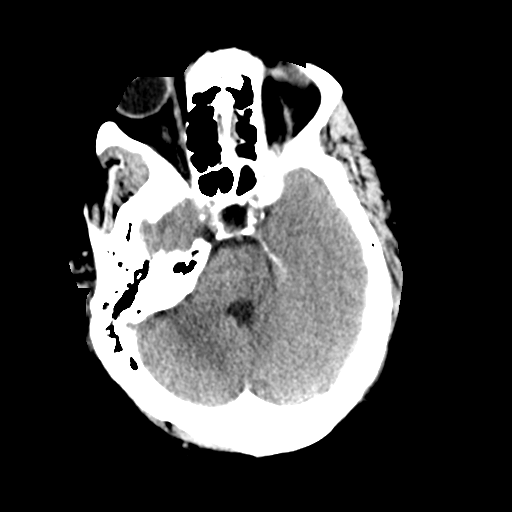
[im 15/35  brain]
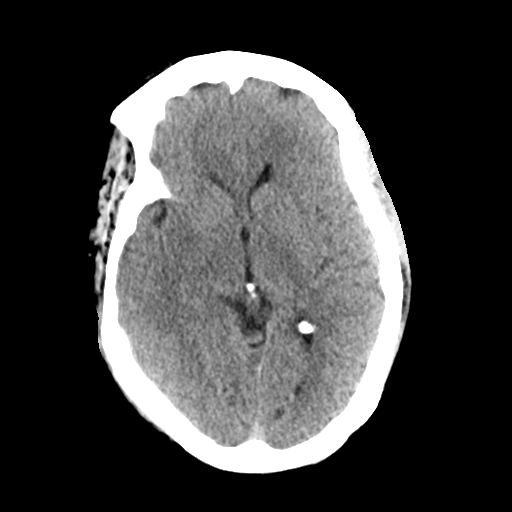
[im 18/35  brain]
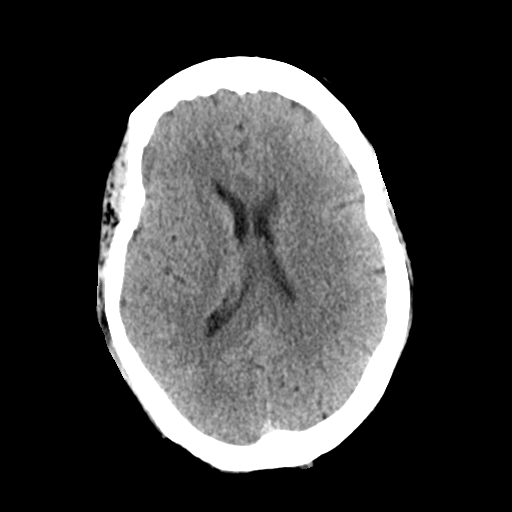
[im 18/35  bone]
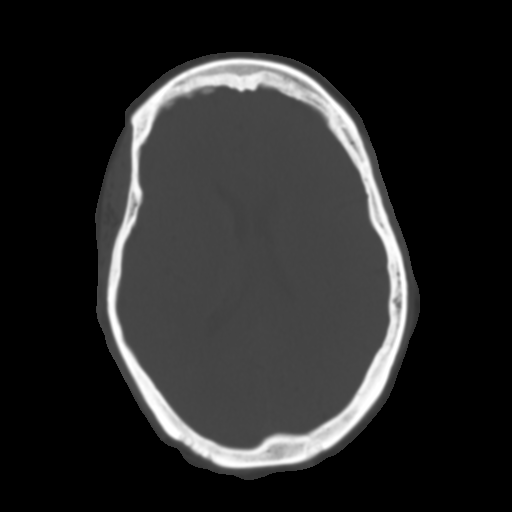
[im 20/35  brain]
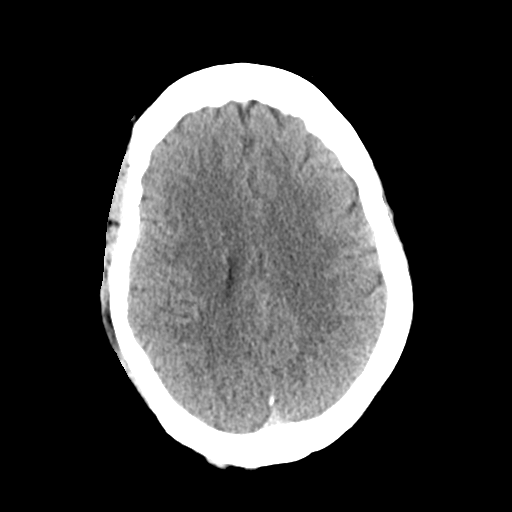
[im 25/35  brain]
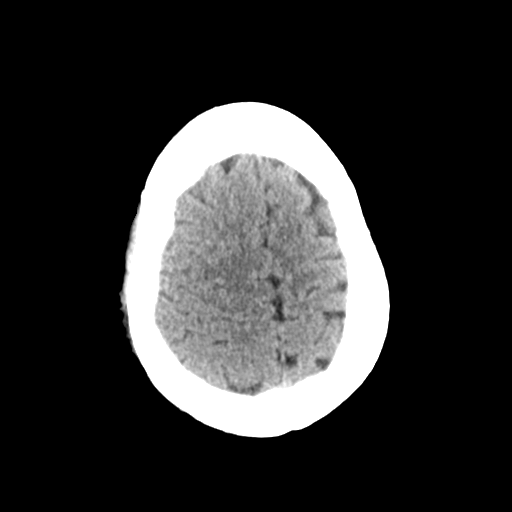
[im 27/35  brain]
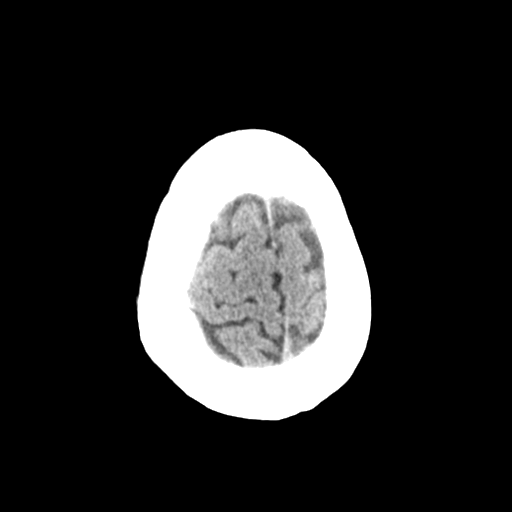
[im 32/35  brain]
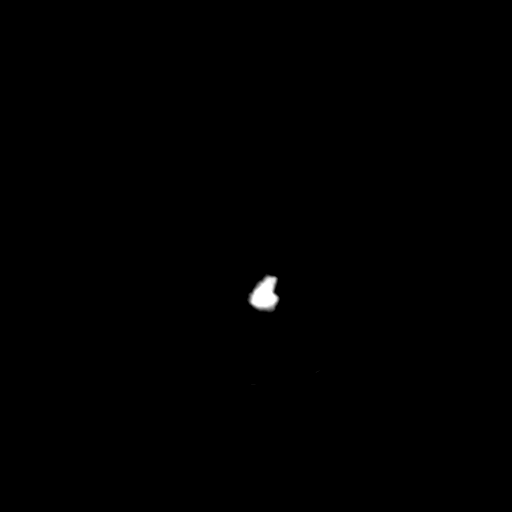
[im 32/35  bone]
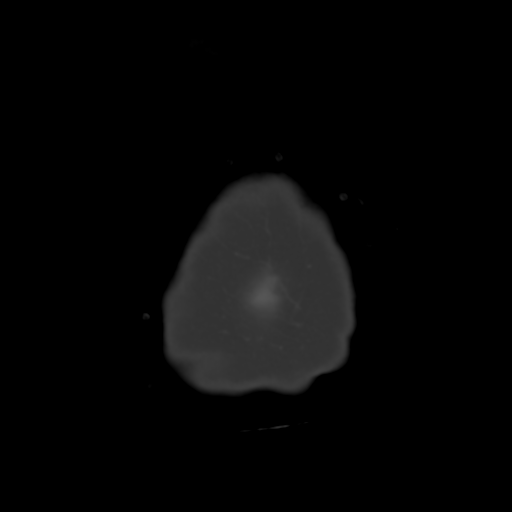

[Series 7: head 3.0 mpr cor · coronal · 0.34mm/px · 3 of 71 slices shown]
[im 24/71  brain]
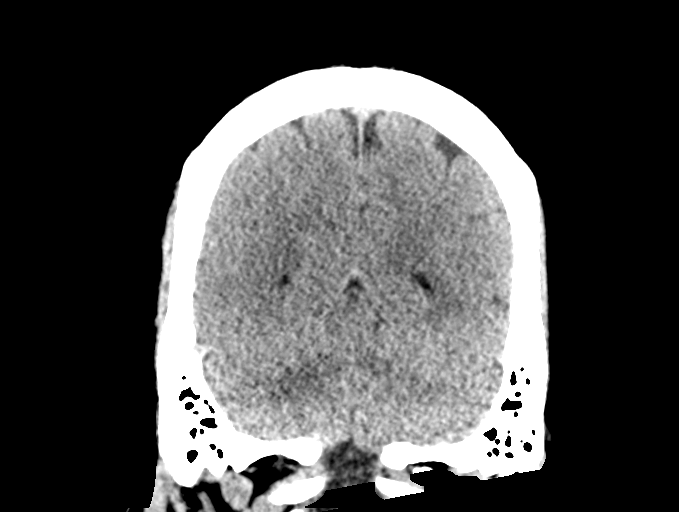
[im 32/71  brain]
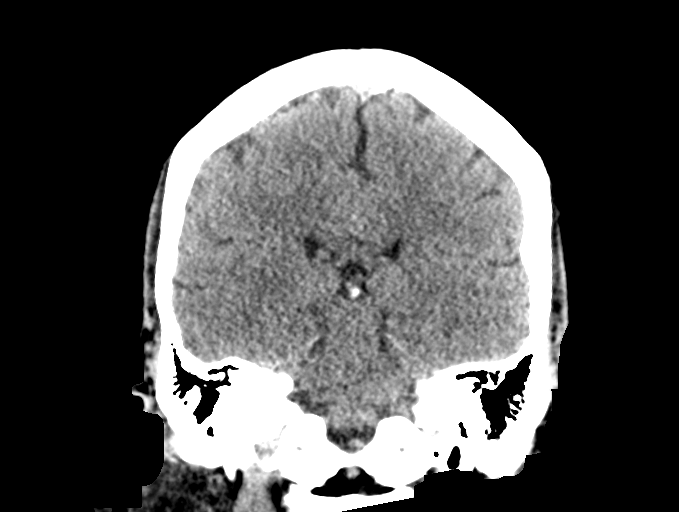
[im 39/71  brain]
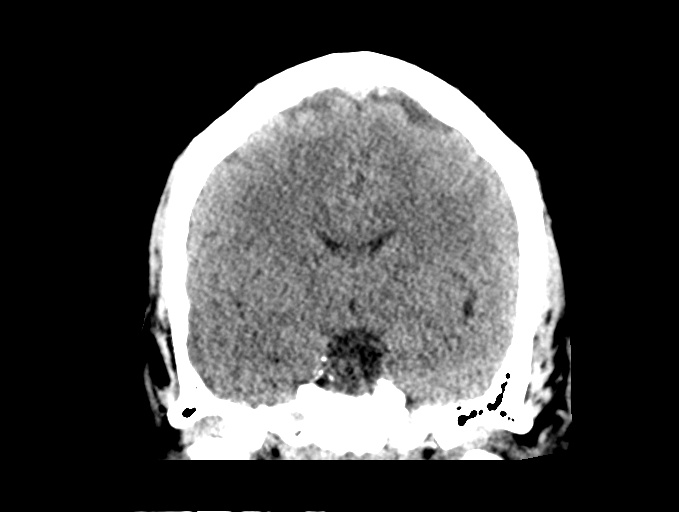

[Series 8: head 3.0 mpr sag · sagittal · 0.34mm/px · 3 of 57 slices shown]
[im 23/57  brain]
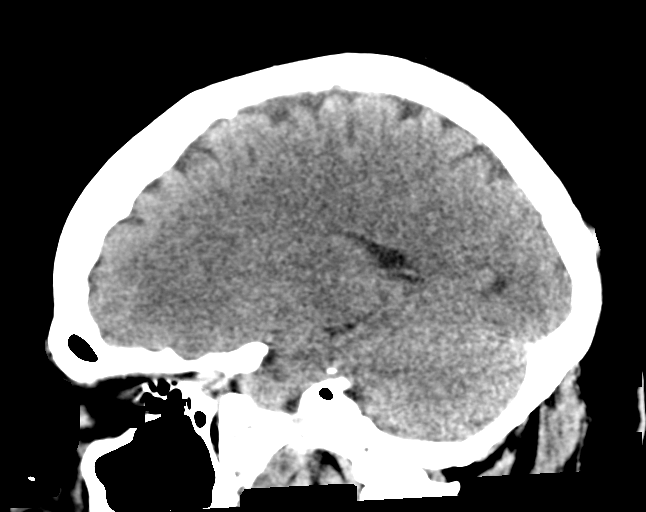
[im 29/57  brain]
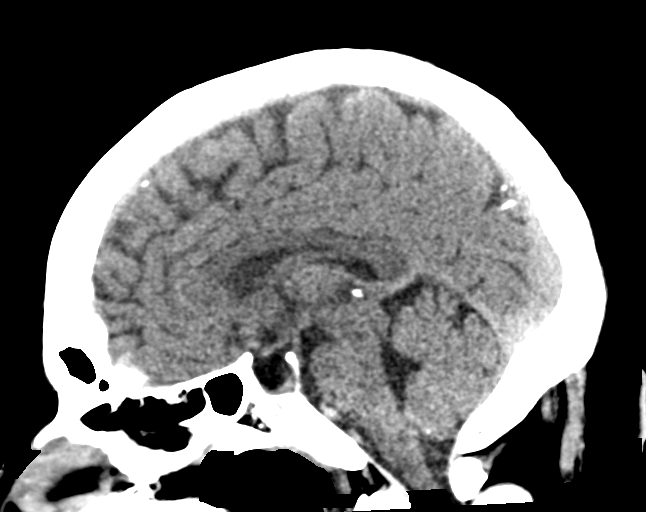
[im 35/57  brain]
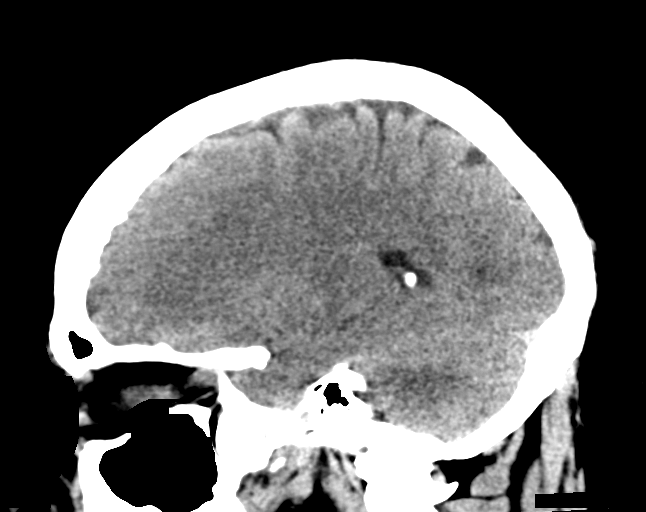

[15 of 47 positions shown; findings below may reference images not displayed]

FINDINGS: Brain: No evidence of acute infarction, hemorrhage, hydrocephalus,
extra-axial collection or mass lesion/mass effect. Partially empty
sella, a nonspecific finding in isolation.

Vascular: No hyperdense vessel or unexpected calcification.

Skull: Normal. Negative for fracture or focal lesion.

Sinuses/Orbits: No acute finding.
IMPRESSION: No acute finding.

## 2023-01-12 MED ORDER — ENOXAPARIN SODIUM 150 MG/ML IJ SOSY: 150.0000 mg | PREFILLED_SYRINGE | Freq: Two times a day (BID) | INTRAMUSCULAR | Status: DC

## 2023-01-12 NOTE — Telephone Encounter (Signed)
01/12/2023 Triage note never made it to [facility].  Similar bridging as last time. Eliquis over weekend then on Monday switch to lovenox. Will take lovenox BID Mon/Tues (4 total doses) then hold AM of sugery (11/20). She will clarify with surgeon if okay to resume eliquis day after surgery. Script sent to her [facility]. Message sent to [facility] regarding.  Myrla Halsted MD PCCM

## 2023-01-16 ENCOUNTER — Telehealth: Payer: Self-pay | Admitting: Pulmonary Disease

## 2023-01-16 NOTE — Progress Notes (Signed)
SDW CALL  Patient was given pre-op instructions over the phone. The opportunity was given for the patient to ask questions. No further questions asked. Patient verbalized understanding of instructions given.   PCP - Dr. Leilani Able Cardiologist - Dr. Dorthula Nettles Pulmonologist - Dr. Francine Graven  PPM/ICD - denies Patient has a loop recorded but states that it is deactivated  Chest x-ray - 12/08/22 EKG - 09/01/22 Stress Test -  ECHO - 03/2022 Cardiac Cath - 2019  Sleep Study - OSA+ CPAP - pt states she wears BiPAP but does not know settings   Blood Thinner Instructions: pt received instructions to stop Eliquis on 11/17 and patient states that was her last dose.  Patient started lovenox bridge on 11/18 but only took one dose.  Patient states that she is taking 2 doses of Lovenox today and will not take DOS.     Aspirin Instructions: n/a  ERAS Protcol - clears until 0930  COVID TEST- n/a   Anesthesia review: yes  Patient denies shortness of breath, fever, cough and chest pain over the phone call   All instructions explained to the patient, with a verbal understanding of the material. Patient agrees to go over the instructions while at home for a better understanding.

## 2023-01-16 NOTE — Progress Notes (Signed)
Spoke to the pt, she will arrive tom at 20. To stop clear liquids @ 0845.

## 2023-01-16 NOTE — Telephone Encounter (Signed)
Dr Francine Graven- will you please see the last msg and advise on lovenox rx?   Pt says she thinks she did not receive enough

## 2023-01-16 NOTE — Telephone Encounter (Addendum)
Pt scheduled for lumpectomy tomorrow 01/17/23  She was told to hold eliquis starting 01/14/23 and start lovenox 2 doses 11/18 and 2 doses 11/19  She did stop eliquis [place] 11/17  She was able to take 1 dose of lovenox yesterday, but her pharmacy was out of stock and this is all she has had  I spoke with Dr Judeth Horn regarding this bc pt questioned whether or not she needed to put the procedure off  He states being that she had stopped her eliquis in time, she is okay to proceed with the procedure  I made her aware of this and she verbalized understanding Routing to Dr Francine Graven as Lorain Childes

## 2023-01-16 NOTE — Progress Notes (Signed)
Surgical Instructions   Your procedure is scheduled on Wednesday, November 20th, 2024. Report to [facility] Main Entrance "A" at 10:00 A.M., then check in with the Admitting office. Any questions or running late day of surgery: call [phone]  Questions prior to your surgery date: call [phone], Monday-Friday, 8am-4pm. If you experience any cold or flu symptoms such as cough, fever, chills, shortness of breath, etc. between now and your scheduled surgery, please notify us at the above number.     Remember:  Do not eat after midnight the night before your surgery   You may drink clear liquids until 9:30 the morning of your surgery.    Clear liquids allowed are: Water, Non-Citrus Juices (without pulp), Carbonated Beverages, Clear Tea (no milk, honey, etc.), Black Coffee Only (NO MILK, CREAM OR POWDERED CREAMER of any kind), and Gatorade.    Take these medicines the morning of surgery with A SIP OF WATER: Protonix Klonopin Flexeril Zoloft Verapamil Eye drops   May take these medicines IF NEEDED: Tylenol Albuterol inhaler (please bring with you)  Follow your instructions on Eliquis and Lovenox   One week prior to surgery, STOP taking any Aspirin (unless otherwise instructed by your surgeon) Aleve, Naproxen, Ibuprofen, Motrin, Advil, Goody's, BC's, all herbal medications, fish oil, and non-prescription vitamins.                     Do NOT Smoke (Tobacco/Vaping) for 24 hours prior to your procedure.  If you use a CPAP at night, you may bring your mask/headgear for your overnight stay.   You will be asked to remove any contacts, glasses, piercing's, hearing aid's, dentures/partials prior to surgery. Please bring cases for these items if needed.    Patients discharged the day of surgery will not be allowed to drive home, and someone needs to stay with them for 24 hours.  SURGICAL WAITING ROOM VISITATION Patients may have no more than 2 support people in the waiting area  - these visitors may rotate.   Pre-op nurse will coordinate an appropriate time for 1 ADULT support person, who may not rotate, to accompany patient in pre-op.  Children under the age of 9 must have an adult with them who is not the patient and must remain in the main waiting area with an adult.  If the patient needs to stay at the hospital during part of their recovery, the visitor guidelines for inpatient rooms apply.  Please refer to the [facility] website for the visitor guidelines for any additional information.   If you received a COVID test during your pre-op visit  it is requested that you wear a mask when out in public, stay away from anyone that may not be feeling well and notify your surgeon if you develop symptoms. If you have been in contact with anyone that has tested positive in the last 10 days please notify you surgeon.    Additional instructions for the day of surgery: DO NOT APPLY any lotions, deodorants, cologne, or perfumes.   Do not wear jewelry or makeup Do not wear nail polish, gel polish, artificial nails, or any other type of covering on natural nails (fingers and toes) Do not bring valuables to the hospital. [facility] is not responsible for valuables/personal belongings. Put on clean/comfortable clothes.  Please brush your teeth.  Ask your nurse before applying any prescription medications to the skin.

## 2023-01-16 NOTE — Telephone Encounter (Signed)
PT states all her medication for her pre-op were not filled. Asking Verlon Au to call. [phone]

## 2023-01-17 ENCOUNTER — Encounter (HOSPITAL_COMMUNITY)
Admission: RE | Disposition: A | Discharge status: Home / Self Care | Payer: Self-pay | Source: Home / Self Care | Attending: General Surgery

## 2023-01-17 ENCOUNTER — Encounter (HOSPITAL_COMMUNITY): Payer: Self-pay | Admitting: General Surgery

## 2023-01-17 ENCOUNTER — Other Ambulatory Visit: Payer: Self-pay

## 2023-01-17 ENCOUNTER — Other Ambulatory Visit (HOSPITAL_COMMUNITY): Payer: Self-pay

## 2023-01-17 ENCOUNTER — Ambulatory Visit (HOSPITAL_COMMUNITY): Payer: Medicare HMO | Admitting: Anesthesiology

## 2023-01-17 ENCOUNTER — Ambulatory Visit (HOSPITAL_COMMUNITY)
Admission: RE | Admit: 2023-01-17 | Discharge: 2023-01-17 | Disposition: A | Discharge status: Home / Self Care | Payer: Medicare HMO | Attending: General Surgery | Admitting: General Surgery

## 2023-01-17 DIAGNOSIS — Z87891 Personal history of nicotine dependence: Secondary | ICD-10-CM | POA: Insufficient documentation

## 2023-01-17 DIAGNOSIS — I110 Hypertensive heart disease with heart failure: Secondary | ICD-10-CM | POA: Insufficient documentation

## 2023-01-17 DIAGNOSIS — I5030 Unspecified diastolic (congestive) heart failure: Secondary | ICD-10-CM | POA: Diagnosis not present

## 2023-01-17 DIAGNOSIS — I2699 Other pulmonary embolism without acute cor pulmonale: Secondary | ICD-10-CM

## 2023-01-17 DIAGNOSIS — K219 Gastro-esophageal reflux disease without esophagitis: Secondary | ICD-10-CM | POA: Insufficient documentation

## 2023-01-17 DIAGNOSIS — C50911 Malignant neoplasm of unspecified site of right female breast: Secondary | ICD-10-CM

## 2023-01-17 DIAGNOSIS — J4489 Other specified chronic obstructive pulmonary disease: Secondary | ICD-10-CM | POA: Insufficient documentation

## 2023-01-17 DIAGNOSIS — Z170 Estrogen receptor positive status [ER+]: Secondary | ICD-10-CM | POA: Diagnosis not present

## 2023-01-17 DIAGNOSIS — C50511 Malignant neoplasm of lower-outer quadrant of right female breast: Secondary | ICD-10-CM | POA: Insufficient documentation

## 2023-01-17 DIAGNOSIS — Z7951 Long term (current) use of inhaled steroids: Secondary | ICD-10-CM | POA: Diagnosis not present

## 2023-01-17 DIAGNOSIS — I252 Old myocardial infarction: Secondary | ICD-10-CM | POA: Diagnosis not present

## 2023-01-17 HISTORY — PX: RE-EXCISION OF BREAST LUMPECTOMY: SHX6048

## 2023-01-17 LAB — CBC
HCT: 36.3 % (ref 36.0–46.0)
Hemoglobin: 11.9 g/dL — ABNORMAL LOW (ref 12.0–15.0)
MCH: 32.5 pg (ref 26.0–34.0)
MCHC: 32.8 g/dL (ref 30.0–36.0)
MCV: 99.2 fL (ref 80.0–100.0)
Platelets: 173 10*3/uL (ref 150–400)
RBC: 3.66 MIL/uL — ABNORMAL LOW (ref 3.87–5.11)
RDW: 14.9 % (ref 11.5–15.5)
WBC: 4.5 10*3/uL (ref 4.0–10.5)
nRBC: 0 % (ref 0.0–0.2)

## 2023-01-17 LAB — BASIC METABOLIC PANEL
Anion gap: 12 (ref 5–15)
BUN: 8 mg/dL (ref 6–20)
CO2: 27 mmol/L (ref 22–32)
Calcium: 9.1 mg/dL (ref 8.9–10.3)
Chloride: 100 mmol/L (ref 98–111)
Creatinine, Ser: 1.08 mg/dL — ABNORMAL HIGH (ref 0.44–1.00)
GFR, Estimated: >60 mL/min (ref >60)
Glucose, Bld: 94 mg/dL (ref 70–99)
Potassium: 3.4 mmol/L — ABNORMAL LOW (ref 3.5–5.1)
Sodium: 139 mmol/L (ref 135–145)

## 2023-01-17 SURGERY — EXCISION, LESION, BREAST
Anesthesia: Regional | Site: Breast | Laterality: Right

## 2023-01-17 MED ORDER — 0.9 % SODIUM CHLORIDE (POUR BTL) OPTIME
TOPICAL | Status: DC | PRN
  Administered 2023-01-17: 1000 mL

## 2023-01-17 MED ORDER — CHLORHEXIDINE GLUCONATE 0.12 % MT SOLN
15.0000 mL | Freq: Once | OROMUCOSAL | Status: AC
  Administered 2023-01-17: 15 mL via OROMUCOSAL
  Filled 2023-01-17: qty 15

## 2023-01-17 MED ORDER — FENTANYL CITRATE (PF) 100 MCG/2ML IJ SOLN
INTRAMUSCULAR | Status: AC
  Filled 2023-01-17: qty 2

## 2023-01-17 MED ORDER — FENTANYL CITRATE (PF) 250 MCG/5ML IJ SOLN
INTRAMUSCULAR | Status: AC
  Filled 2023-01-17: qty 5

## 2023-01-17 MED ORDER — LACTATED RINGERS IV SOLN: INTRAVENOUS | Status: DC

## 2023-01-17 MED ORDER — DEXTROSE 5 % IV SOLN
INTRAVENOUS | Status: DC | PRN
  Administered 2023-01-17: 3 g via INTRAVENOUS

## 2023-01-17 MED ORDER — LIDOCAINE 2% (20 MG/ML) 5 ML SYRINGE
INTRAMUSCULAR | Status: DC | PRN
  Administered 2023-01-17: 100 mg via INTRAVENOUS

## 2023-01-17 MED ORDER — FENTANYL CITRATE (PF) 100 MCG/2ML IJ SOLN: 25.0000 ug | INTRAMUSCULAR | Status: DC | PRN

## 2023-01-17 MED ORDER — PROPOFOL 10 MG/ML IV BOLUS
INTRAVENOUS | Status: DC | PRN
  Administered 2023-01-17: 200 mg via INTRAVENOUS

## 2023-01-17 MED ORDER — ONDANSETRON HCL 4 MG/2ML IJ SOLN
INTRAMUSCULAR | Status: DC | PRN
  Administered 2023-01-17: 4 mg via INTRAVENOUS

## 2023-01-17 MED ORDER — ORAL CARE MOUTH RINSE: 15.0000 mL | Freq: Once | OROMUCOSAL | Status: AC

## 2023-01-17 MED ORDER — LACTATED RINGERS IV SOLN: INTRAVENOUS | Status: DC | PRN

## 2023-01-17 MED ORDER — ACETAMINOPHEN 500 MG PO TABS
1000.0000 mg | ORAL_TABLET | ORAL | Status: DC
  Filled 2023-01-17: qty 2

## 2023-01-17 MED ORDER — LIDOCAINE HCL (PF) 1 % IJ SOLN
INTRAMUSCULAR | Status: AC
  Filled 2023-01-17: qty 30

## 2023-01-17 MED ORDER — CHLORHEXIDINE GLUCONATE CLOTH 2 % EX PADS: 6.0000 | MEDICATED_PAD | Freq: Once | CUTANEOUS | Status: DC

## 2023-01-17 MED ORDER — MIDAZOLAM HCL 2 MG/2ML IJ SOLN
INTRAMUSCULAR | Status: AC
  Filled 2023-01-17: qty 2

## 2023-01-17 MED ORDER — OXYCODONE HCL 5 MG PO TABS: 5.0000 mg | ORAL_TABLET | Freq: Once | ORAL | Status: DC | PRN

## 2023-01-17 MED ORDER — MIDAZOLAM HCL 2 MG/2ML IJ SOLN: INTRAMUSCULAR | Status: DC | PRN

## 2023-01-17 MED ORDER — ACETAMINOPHEN 10 MG/ML IV SOLN: 1000.0000 mg | Freq: Once | INTRAVENOUS | Status: DC | PRN

## 2023-01-17 MED ORDER — LIDOCAINE HCL 1 % IJ SOLN
INTRAMUSCULAR | Status: DC | PRN
  Administered 2023-01-17: 40 mL via INTRAMUSCULAR

## 2023-01-17 MED ORDER — DEXAMETHASONE SODIUM PHOSPHATE 10 MG/ML IJ SOLN
INTRAMUSCULAR | Status: DC | PRN
  Administered 2023-01-17: 10 mg via INTRAVENOUS

## 2023-01-17 MED ORDER — OXYCODONE HCL 5 MG PO TABS
5.0000 mg | ORAL_TABLET | Freq: Four times a day (QID) | ORAL | 0 refills | Status: DC | PRN
  Filled 2023-01-17: qty 30, 8d supply, fill #0

## 2023-01-17 MED ORDER — FENTANYL CITRATE (PF) 250 MCG/5ML IJ SOLN
INTRAMUSCULAR | Status: DC | PRN
  Administered 2023-01-17: 25 ug via INTRAVENOUS
  Administered 2023-01-17: 75 ug via INTRAVENOUS

## 2023-01-17 MED ORDER — CEFAZOLIN IN SODIUM CHLORIDE 3-0.9 GM/100ML-% IV SOLN
3.0000 g | INTRAVENOUS | Status: DC
  Filled 2023-01-17: qty 100

## 2023-01-17 MED ORDER — BUPIVACAINE-EPINEPHRINE (PF) 0.25% -1:200000 IJ SOLN
INTRAMUSCULAR | Status: AC
  Filled 2023-01-17: qty 30

## 2023-01-17 MED ORDER — ENOXAPARIN SODIUM 150 MG/ML IJ SOSY
1.0000 mg/kg | PREFILLED_SYRINGE | Freq: Two times a day (BID) | INTRAMUSCULAR | 0 refills | Status: DC
  Filled 2023-01-17: qty 5, 3d supply, fill #0

## 2023-01-17 MED ORDER — OXYCODONE HCL 5 MG/5ML PO SOLN: 5.0000 mg | Freq: Once | ORAL | Status: DC | PRN

## 2023-01-17 SURGICAL SUPPLY — 38 items
BINDER BREAST 3XL (GAUZE/BANDAGES/DRESSINGS) ×1 IMPLANT
CANISTER SUCT 3000ML PPV (MISCELLANEOUS) ×2 IMPLANT
CHLORAPREP W/TINT 26 (MISCELLANEOUS) ×2 IMPLANT
CLIP TI LARGE 6 (CLIP) ×1 IMPLANT
CLIP TI MEDIUM 24 (CLIP) ×2 IMPLANT
CNTNR URN SCR LID CUP LEK RST (MISCELLANEOUS) ×1 IMPLANT
COVER SURGICAL LIGHT HANDLE (MISCELLANEOUS) ×2 IMPLANT
DERMABOND ADVANCED .7 DNX12 (GAUZE/BANDAGES/DRESSINGS) ×2 IMPLANT
DEVICE DUBIN SPECIMEN MAMMOGRA (MISCELLANEOUS) IMPLANT
DRAPE CHEST BREAST 15X10 FENES (DRAPES) ×2 IMPLANT
DRAPE SURG 17X23 STRL (DRAPES) IMPLANT
ELECT COATED BLADE 2.86 ST (ELECTRODE) ×2 IMPLANT
ELECT REM PT RETURN 9FT ADLT (ELECTROSURGICAL) ×2
ELECTRODE REM PT RTRN 9FT ADLT (ELECTROSURGICAL) ×1 IMPLANT
GAUZE PAD ABD 8X10 STRL (GAUZE/BANDAGES/DRESSINGS) ×2 IMPLANT
GAUZE SPONGE 4X4 12PLY STRL (GAUZE/BANDAGES/DRESSINGS) ×1 IMPLANT
GLOVE BIO SURGEON STRL SZ 6 (GLOVE) ×2 IMPLANT
GLOVE INDICATOR 6.5 STRL GRN (GLOVE) ×2 IMPLANT
GOWN STRL REUS W/ TWL LRG LVL3 (GOWN DISPOSABLE) ×3 IMPLANT
GOWN STRL REUS W/ TWL XL LVL3 (GOWN DISPOSABLE) ×2 IMPLANT
KIT BASIN OR (CUSTOM PROCEDURE TRAY) ×2 IMPLANT
KIT MARKER MARGIN INK (KITS) ×2 IMPLANT
NDL 18GX1X1/2 (RX/OR ONLY) (NEEDLE) IMPLANT
NDL FILTER BLUNT 18X1 1/2 (NEEDLE) IMPLANT
NDL HYPO 25GX1X1/2 BEV (NEEDLE) ×1 IMPLANT
NEEDLE 18GX1X1/2 (RX/OR ONLY) (NEEDLE)
NEEDLE FILTER BLUNT 18X1 1/2 (NEEDLE)
NEEDLE HYPO 25GX1X1/2 BEV (NEEDLE) ×2
NS IRRIG 1000ML POUR BTL (IV SOLUTION) ×2 IMPLANT
PACK GENERAL/GYN (CUSTOM PROCEDURE TRAY) ×2 IMPLANT
STRIP CLOSURE SKIN 1/2X4 (GAUZE/BANDAGES/DRESSINGS) ×2 IMPLANT
SUT MNCRL AB 4-0 PS2 18 (SUTURE) ×3 IMPLANT
SUT SILK 2 0 SH (SUTURE) ×1 IMPLANT
SUT VIC AB 2-0 SH 27XBRD (SUTURE) ×1 IMPLANT
SUT VIC AB 3-0 SH 8-18 (SUTURE) ×1 IMPLANT
SYR CONTROL 10ML LL (SYRINGE) ×2 IMPLANT
TOWEL GREEN STERILE (TOWEL DISPOSABLE) ×1 IMPLANT
TOWEL GREEN STERILE FF (TOWEL DISPOSABLE) ×2 IMPLANT

## 2023-01-17 NOTE — Interval H&P Note (Signed)
History and Physical Interval Note:  01/17/2023 11:26 AM  Julie Cruz  has presented today for surgery, with the diagnosis of RIGHT BREAST CANCER.  The various methods of treatment have been discussed with the patient and family. After consideration of risks, benefits and other options for treatment, the patient has consented to  Procedure(s) with comments: RE-EXCISION RIGHT BREAST LUMPECTOMY (Right)  as a surgical intervention.  The patient's history has been reviewed, patient examined, no change in status, stable for surgery.  I have reviewed the patient's chart and labs.  Questions were answered to the patient's satisfaction.     Almond Lint

## 2023-01-17 NOTE — H&P (Signed)
PROVIDER: Matthias Hughs, MD Patient Care Team: Karie Chimera, MD as PCP - General (Family Medicine) Matthias Hughs, MD as Consulting Provider (Surgical Oncology) Dorthula Nettles, DO (Cardiovascular Disease) Martina Sinner, MD (Pulmonary Disease) Gae Bon, MD (Ophthalmology) Francee Gentile, MD (Rheumatology)  MRN: [id number] DOB: [birth year] DATE OF ENCOUNTER: 01/15/2023 Initial History:  Patient presents with a new diagnosis of right breast cancer August 2024. She was found to have a screening detected calcifications. She subsequently underwent diagnostic mammography which showed group of microcalcifications measuring 2.6 cm in greatest dimension. There was subtle asymmetry. A core needle biopsy was performed. 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 Estrogen receptor positivity was 100% with strong intensity. PR was not reported.  With regard to family cancer history, her father has had pancreatic cancer. She had some more distant relatives with history of breast cancer. She is quite anxious to have surgery.  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 She is currently on methotrexate for this. She has tried steroids which severely exacerbated her depression. She currently is awaiting an appointment with rheumatology to discuss dual treatment with an additional immunosuppressive medication. Patient has had some heart failure but has preserved systolic function.  She does well from a pulmonary standpoint overall. She states that her eyes are relatively stable at this point.  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  Family cancer history - father pancreatic cancer   Interval History:  Pt underwent right breast seed localized lumpectomy 12/28/22. She was found to have 1.5 mm of invasive cancer and the lateral margin was focally  positive for DCIS. She had some bleeding near the incision. She isn't having a lot of pain.   Pathology 12/28/2022 A. BREAST, RIGHT, LUMPECTOMY: Invasive ductal carcinoma, 1.5 mm (0.15 cm), grade 1 Extensive ductal carcinoma in situ, cribriform and solid, nuclear grade 2, with necrosis, 25 mm (2.5 cm) Margins, invasive: Negative Closest, invasive: 10 mm, lateral Margins, DCIS: Positive (See part D) Closest, DCIS: 0 mm, lateral Lymphovascular invasion: Not identified Prognostic markers: ER positive per outside report, PR not reported Biopsy site and biopsy clip Other: Usual ductal hyperplasia See oncology table and comment  B. BREAST, RIGHT ANTERIOR MARGIN, EXCISION: Benign breast tissue Anterior margin negative for carcinoma  C. BREAST, RIGHT INFERIOR MARGIN, EXCISION: Benign breast tissue Inferior margin negative for carcinoma  D. BREAST, RIGHT LATERAL MARGIN, EXCISION: Ductal carcinoma in situ, 1.1 cm DCIS involves lateral margin focally  E. BREAST, RIGHT MEDIAL MARGIN, EXCISION: Benign breast tissue Medial margin negative for carcinoma  F. BREAST, RIGHT POSTERIOR MARGIN, EXCISION: Ductal carcinoma in situ, 0.5 cm DCIS is 0.4 cm from posterior margin  G. BREAST, RIGHT SUPERIOR MARGIN, EXCISION: Ductal carcinoma in situ, 0.6 cm DCIS is 0.5 cm from superior margin  Physical Examination:  Right breast: there is a raw area of the skin that is bleeding. Pressure held and it stopped. Ointment and telfa used on top to minimize trauma to skin.  Assessment and Plan:  Diagnoses and all orders for this visit:  Malignant neoplasm of lower-outer quadrant of right breast of female, estrogen receptor positive (CMS/HHS-HCC)  Reviewed with dr. Pamelia Hoit. Due to the very small amount of invasive cancer on final path, we will hold off on SLN bx. Patient is set up for reexcision. Radiation will be recommended. Patient will not be able to take tamoxifen due to prior blood  clots.  Dx mammogram due 09/2023 here or at [facility].   No follow-ups on file.  The plan was discussed in detail with the patient today, who expressed understanding. The patient has my contact information, and understands to call me with any additional questions or concerns in the interval. I would be happy to see the patient back sooner if the need arises.  Matthias Hughs, MD Electronically signed by Matthias Hughs, MD at 01/15/2023 3:25 PM EST

## 2023-01-17 NOTE — Anesthesia Postprocedure Evaluation (Addendum)
Anesthesia Post Note  Patient: Julie Cruz  Procedure(s) Performed: RE-EXCISION RIGHT BREAST LUMPECTOMY (Right: Breast)     Patient location during evaluation: PACU Anesthesia Type: General Level of consciousness: awake and alert Pain management: pain level controlled Vital Signs Assessment: post-procedure vital signs reviewed and stable Respiratory status: spontaneous breathing, nonlabored ventilation, respiratory function stable and patient connected to nasal cannula oxygen Cardiovascular status: blood pressure returned to baseline and stable Postop Assessment: no apparent nausea or vomiting Anesthetic complications: no   No notable events documented.  Last Vitals:  Vitals:   01/17/23 1400 01/17/23 1415  BP: 113/71 104/69  Pulse: 78 77  Resp: 17 16  Temp:  36.8 C  SpO2: 97% 100%    Last Pain:  Vitals:   01/17/23 1415  PainSc: Asleep                 [place]

## 2023-01-17 NOTE — Transfer of Care (Signed)
Immediate Anesthesia Transfer of Care Note  Patient: Julie Cruz  Procedure(s) Performed: RE-EXCISION RIGHT BREAST LUMPECTOMY (Right) possible SENTINEL LYMPH NODE BIOPSY (Right)  Patient Location: PACU  Anesthesia Type:General  Level of Consciousness: awake, alert , and oriented  Airway & Oxygen Therapy: Patient Spontanous Breathing and Patient connected to nasal cannula oxygen  Post-op Assessment: Report given to RN  Post vital signs: stable  Last Vitals:  Vitals Value Taken Time  BP 105/75 01/17/23 1323  Temp 36.7 C 01/17/23 1323  Pulse 77 01/17/23 1328  Resp 15 01/17/23 1328  SpO2 96 % 01/17/23 1328  Vitals shown include unfiled device data.  Last Pain:  Vitals:   01/17/23 1023  PainSc: 8          Complications: No notable events documented.

## 2023-01-17 NOTE — Anesthesia Preprocedure Evaluation (Signed)
Anesthesia Evaluation  Patient identified by MRN, date of birth, ID band Patient awake    Reviewed: Allergy & Precautions, NPO status , Patient's Chart, lab work & pertinent test results  Airway Mallampati: II  TM Distance: >3 FB Neck ROM: Full    Dental  (+) Teeth Intact, Dental Advisory Given,    Pulmonary asthma , sleep apnea , COPD,  COPD inhaler, former smoker   Pulmonary exam normal        Cardiovascular hypertension, Pt. on medications + Past MI and +CHF   Rhythm:Regular Rate:Normal  Sarcoidosis Hx of DVT PE   Cardiac MRI: IMPRESSION: 1. Significant artifact throughout study due to implanted loop recorder   2. Small LV size, mild hypertrophy, and normal systolic function (EF 65%)   3.  Normal RV size and systolic function (EF 58%)   4. Basal septal midwall LGE, which is a scar pattern seen in nonischemic cardiomyopathies and associated with a worse prognosis   5. RV insertion site LGE, which is a nonspecific scar pattern often seen in setting of elevated pulmonary pressures    Neuro/Psych  PSYCHIATRIC DISORDERS Anxiety Depression Bipolar Disorder   negative neurological ROS     GI/Hepatic Neg liver ROS,GERD  Medicated,,  Endo/Other  negative endocrine ROS    Renal/GU negative Renal ROS  negative genitourinary   Musculoskeletal  (+) Arthritis ,  Fibromyalgia -  Abdominal  (+) + obese Abdomen: soft.   Peds  Hematology negative hematology ROS (+)   Anesthesia Other Findings DCIS of right breast  Reproductive/Obstetrics                              Anesthesia Physical Anesthesia Plan  ASA: 3  Anesthesia Plan: General and Regional   Post-op Pain Management:    Induction: Intravenous  PONV Risk Score and Plan: 3 and Ondansetron, Dexamethasone and Treatment may vary due to age or medical condition  Airway Management Planned: Oral ETT  Additional Equipment:  None  Intra-op Plan:   Post-operative Plan: Extubation in OR  Informed Consent: I have reviewed the patients History and Physical, chart, labs and discussed the procedure including the risks, benefits and alternatives for the proposed anesthesia with the patient or authorized representative who has indicated his/her understanding and acceptance.     Dental advisory given  Plan Discussed with: CRNA and Anesthesiologist  Anesthesia Plan Comments: (PAT note by Antionette Poles, PA-C: 57 year old female with pertinent history including HTN, pulmonary hypertension, PE (03/2022), COPD, OSA on BiPAP, sarcoidosis, bipolar 1 disorder, moderately reduced RV function.  She is followed by ophthalmology and rheumatology for sarcoid/uveitis and is maintained on methotrexate.  Recently seen by cardiologist Dr. Gasper Lloyd 10/25/2022 for evaluation of potential cardiac sarcoidosis.  Per note, due to severity of her sarcoid, management largely deferred to rheumatology.  Regarding pulmonary hypertension and upcoming surgery, "2. Pulmonary hypertension  - Likely due to a combination of HFpEF/OHS/sarcoid  - Will plan on RHC in the future. Averse to procedures. 3. OSA - Bipap + O2 at night. 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  - From a cardiac standpoint, she is low risk to proceed with excision."  Recent episode of bronchitis dx'd 12/08/22. Seen in followup by pulmonology APP Rubye Oaks, NP on 12/19/2022.  Per note, "From a pulmonary standpoint CT chest February 2024 showed minimal nodular infiltrates with no significant  adenopathy.  Needs repeat PFTs pending those results we will decide on high-resolution CT chest to evaluate for possible parenchymal involvement.  Patient with recent bronchitic flare with improvement with steroids.  Cardiac notes indicate active inflammation.  Ophthalmology notes recommend continuing on methotrexate with stable  condition. We discussed beginning low-dose prednisone for short period.  After review of her medical records.  Will continue for 2 weeks and then discontinue.  20 mg daily for 1 week and then 10 mg daily for 1 week and stop.  No recommendations for additional immunosuppression. Patient aware to stop Prednisone after 2 weeks . Check Chest xray on return or decide if HRCT is indicated. Patient is to continue follow-up with rheumatology."  At PAT appointment patient reported feeling much better after recent bronchitis flare, completed antibiotics and is still on prednisone.  Minimal symptoms at this time.  I advised her I will call to follow-up next week to make sure this is still the case.  I did call and speak with the pt on 12/25/22 and she stated she continues to feel well from a respiratory standpoint. Says she is currently at her baseline, denies cough or SOB. Case discussed with Dr. Chaney Malling, advised okay to proceed as planned barring acute status change.   LD Eliquis 12/25/22. She is on Lovenox bridge.   Preop labs reviewed, unremarkable.  EKG 12/08/2022: NSR.  Rate 83.  Cardiac MRI 06/26/2022: IMPRESSION: 1. Significant artifact throughout study due to implanted loop recorder   2. Small LV size, mild hypertrophy, and normal systolic function (EF 65%)   3.  Normal RV size and systolic function (EF 58%)   4. Basal septal midwall LGE, which is a scar pattern seen in nonischemic cardiomyopathies and associated with a worse prognosis   5. RV insertion site LGE, which is a nonspecific scar pattern often seen in setting of elevated pulmonary pressures  TTE 04/25/22:  1. Left ventricular ejection fraction, by estimation, is 65 to 70%. The  left ventricle has hyperdynamic function. The left ventricle has no  regional wall motion abnormalities. There is mild concentric left  ventricular hypertrophy. Left ventricular  diastolic parameters were normal.   2. Right ventricular systolic  function is moderately reduced. The right  ventricular size is mildly enlarged. Tricuspid regurgitation signal is  inadequate for assessing PA pressure.   3. Left atrial size was mildly dilated.   4. Right atrial size was mildly dilated.   5. The mitral valve is normal in structure. No evidence of mitral valve  regurgitation.   6. The aortic valve is tricuspid. There is mild thickening of the aortic  valve. Aortic valve regurgitation is not visualized. Aortic valve  sclerosis is present, with no evidence of aortic valve stenosis.   7. The inferior vena cava is dilated in size with >50% respiratory  variability, suggesting right atrial pressure of 8 mmHg.   8. Agitated saline contrast bubble study was negative, with no evidence  of any interatrial shunt.   Comparison(s): Prior images reviewed side by side. The right ventricular  systolic function is significantly worse.    )         Anesthesia Quick Evaluation

## 2023-01-17 NOTE — Op Note (Signed)
Re-excisional right Breast Lumpectomy   Indications: This patient presents with history of positive lateral margin after partial mastectomy for right breast cancer   Pre-operative Diagnosis: right breast cancer, [id number]  Post-operative Diagnosis: right breast cancer   Surgeon: Almond Lint   Assistants: n/a   Anesthesia: General anesthesia and Local anesthesia  ASA Class: 3   Procedure Details  The patient was seen in the Holding Room. The risks, benefits, complications, treatment options, and expected outcomes were discussed with the patient. The possibilities of reaction to medication, pulmonary aspiration, bleeding, infection, the need for additional procedures, failure to diagnose a condition, and creating a complication requiring transfusion or operation were discussed with the patient. The patient concurred with the proposed plan, giving informed consent. The site of surgery properly noted/marked. The patient was taken to Operating Room # 2, identified, and the procedure verified as re-excision of right breast cancer.  After induction of anesthesia, the right breast and chest were prepped and draped in standard fashion. The lumpectomy was performed by reopening the prior incision. Seroma was aspirated. The mastopexy sutures were removed. Additional margins were taken at the lateral borders of the partial mastectomy cavity. Dissection was carried down to the pectoral fascia. Orientation sutures were placed in the specimens. Hemostasis was achieved with cautery. The skin was trimmed up.  The wound was irrigated and closed with a 3-0 Vicryl deep dermal interrupted and a 4-0 Monocryl subcuticular closure in layers.  Sterile dressings were applied. At the end of the operation, all sponge, instrument, and needle counts were correct.   Findings:  grossly clear surgical margins  Estimated Blood Loss: Minimal   Drains: none   Specimens: additional lateral margin, skin  Complications: None;  patient tolerated the procedure well.   Disposition: PACU - hemodynamically stable.   Condition: stable

## 2023-01-17 NOTE — Discharge Instructions (Addendum)

## 2023-01-18 ENCOUNTER — Other Ambulatory Visit (HOSPITAL_COMMUNITY): Payer: Self-pay

## 2023-01-18 ENCOUNTER — Encounter (HOSPITAL_COMMUNITY): Payer: Self-pay | Admitting: General Surgery

## 2023-01-18 ENCOUNTER — Telehealth: Payer: Self-pay | Admitting: Pulmonary Disease

## 2023-01-18 NOTE — Telephone Encounter (Signed)
Patient's discharge papers said for her to take lovonox but she states she should only be taking the eliquis. She had the same surgery a month ago and didn't tale the lovonox afterwards. She thinks it was done in error. Please call and advise.

## 2023-01-19 NOTE — Telephone Encounter (Signed)
Called and spoke with patient, referred back to the note from Dr. Francine Graven on 12/29/22 to resume her Eliquis.  She verbalized understanding.  Nothing further needed.

## 2023-01-23 ENCOUNTER — Encounter: Payer: Self-pay | Admitting: *Deleted

## 2023-01-23 LAB — SURGICAL PATHOLOGY

## 2023-01-30 ENCOUNTER — Telehealth: Payer: Self-pay | Admitting: Genetic Counselor

## 2023-01-30 NOTE — Telephone Encounter (Signed)
I followed-up with Julie Cruz to schedule her for genetic counseling. We scheduled her for February 5th, 2025 at 10:00am.   Lalla Brothers, MS, [facility] Genetic Counselor [email or web address] (P) [phone]

## 2023-02-05 ENCOUNTER — Encounter: Payer: Self-pay | Admitting: General Practice

## 2023-02-05 ENCOUNTER — Telehealth: Payer: Self-pay | Admitting: Licensed Clinical Social Worker

## 2023-02-05 NOTE — Progress Notes (Signed)
[facility] Spiritual Care Note  Attempted follow-up phone call, leaving voicemail with direct number and encouragement to return call.   [address] [place], [place], [facility] Pager [phone] Voicemail [phone]

## 2023-02-05 NOTE — Telephone Encounter (Signed)
[facility] Clinical Social Work  Clinical Social Work was referred by  Environmental manager  for assessment of psychosocial needs.  Clinical Social Worker attempted to contact patient by phone to offer support and assess for needs.   No answer. Left VM with direct contact information.     Izabellah Dadisman E Lateisha Thurlow, LCSW  Clinical Social Worker [facility]

## 2023-02-08 ENCOUNTER — Encounter (HOSPITAL_COMMUNITY): Payer: Medicare HMO | Admitting: Cardiology

## 2023-02-08 ENCOUNTER — Inpatient Hospital Stay: Payer: Medicare HMO | Attending: Hematology and Oncology | Admitting: Licensed Clinical Social Worker

## 2023-02-08 NOTE — Progress Notes (Signed)
[facility] Clinical Social Work  Initial Assessment   Julie Cruz is a 57 y.o. year old female contacted by phone. Clinical Social Work was referred by  Environmental manager  for assessment of psychosocial needs.   SDOH (Social Determinants of Health) assessments performed: Yes SDOH Interventions    Flowsheet Row Clinical Support from 02/08/2023 in [facility] - A Dept Of [place]. [facility]  SDOH Interventions   Food Insecurity Interventions Other (Comment)  [has SNAP benefits]  Transportation Interventions Payor Benefit, SCAT (Specialized Community Area Transporation), Other (Comment)  [facility] transportation referral]       SDOH Screenings   Food Insecurity: Food Insecurity Present (02/08/2023)  Transportation Needs: Unmet Transportation Needs (02/08/2023)  Social Connections: Unknown (12/27/2021)   Received from [facility]  Tobacco Use: Medium Risk (01/29/2023)   Received from [facility]     Distress Screen completed: No     No data to display            Family/Social Information:  Housing Arrangement: patient lives with two of her adult daughters Family members/support persons in your life? Family Transportation concerns: yes, does not drive or take bus due to health issues. Has rides through insurance, but limited number per year  Employment: Legally disabled.  Income source: Secretary/administrator concerns: Yes, current concerns Type of concern: Lobbyist access concerns: has SNAP benefits Religious or spiritual practice: Yes-her faith helps her keep going despite many health issues Services Currently in place:  [employer], SSDI, SNAP  Coping/ Adjustment to diagnosis: Patient understands treatment plan and what happens next? yes, waiting to heal to start radiation. Will need transportation help. She has many health issues in addition to cancer, both  physical and mental. 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: Actuary, Transportation, Anxiety/ nervousness, and Physical issues Patient enjoys time with family/ friends Current coping skills/ strengths: Active sense of humor , Motivation for treatment/growth , and Religious Affiliation     SUMMARY: Current SDOH Barriers:  Financial constraints related to fixed income and increased medical expenses and Transportation  Clinical Social Work Clinical Goal(s):  Explore community resource options for unmet needs related to:  Insurance risk surveyor Strain   Interventions: Discussed common feeling and emotions when being diagnosed with cancer, and the importance of support during treatment Informed patient of the support team roles and support services at [facility] Provided CSW contact information and encouraged patient to call with any questions or concerns Assisted in completing Part A of [employer] [place] application. Mailed Part B for pt to bring to primary care provider due to need being from other medical issues Referred to [facility] transportation coordinator Referred to S. Yetta Flock for [employer]   Follow Up Plan: Patient will contact CSW with any support or resource needs Patient verbalizes understanding of plan: Yes    Roniel Halloran E Ahnesty Finfrock, LCSW Clinical Social Worker [facility]

## 2023-02-08 NOTE — Progress Notes (Incomplete)
ADVANCED HEART FAILURE CLINIC NOTE  Referring Physician: Leilani Able, MD  Primary Care: Leilani Able, MD Primary Cardiologist: Dr. Gasper Lloyd  HPI: Julie Cruz is a 57 y.o. female with with pulmonary hypertension, hypertension, fibromyalgia and pulmonary and cardiac sarcoid presenting today to establish care.  Based on chart review it appears that she was initially diagnosed with sarcoid in 2007 at [facility] via endobronchial biopsy.  In September 2018 she had a cardiac MRI with normal LV/RV function but linear area of intramyocardial LGE at the septum.  She had a PET scan in 2020 that showed no cardiac uptake. Admitted in 2/24 for SOB w/ CTPE demonstrating B/L PE; noted to have LLE DVT. TTE at that [facility] with LVEF of 65%, moderate RV dysfunction. Cardiac PET in 4/24 with FDG uptake in the basal inferior/inferolateral walls consistent with LGE pattern on CMR.   Julie Cruz reports that she is very limited due to diffuse pain and fatigue.  She is intolerant to any steroids, reports that she has severe depression and that this depression is exacerbated by any steroid use.  Interval hx Since our last visit she has had a diagnostic mammogram with right breast DCIS. 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 She will require surgery and XRT. She reports that excision is pending surgical clearance from a heart standpoint. Otherwise, from a cardiac sarcoid standpoint, she continues to take MTX as prescribed by her previous rheumatologist. She has an upcoming appt with rheumatology here in [place]. 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   Past Medical History:  Diagnosis Date   Acute medial meniscus tear    Anxiety    Arthritis    Bipolar 1 disorder (HCC)    Bronchitis 12/08/2022   Cancer (HCC)    right breast ca   CHF (congestive heart failure) (HCC)    COPD (chronic obstructive pulmonary disease) (HCC)    Depression     Dyspnea    Fibromyalgia    H/O spinal cord injury    T, L ans CSpine   Hypertension    Menopause    Myocardial infarction (HCC)    mild per patient in ? 2014   Pulmonary hypertension (HCC)    Sarcoid, cardiac    Sleep apnea    uses BiPAP nightly    Current Outpatient Medications  Medication Sig Dispense Refill   acetaminophen (TYLENOL) 500 MG tablet Take 500-750 mg by mouth every 8 (eight) hours as needed for mild pain (pain score 1-3) or moderate pain (pain score 4-6).     albuterol (VENTOLIN HFA) 108 (90 Base) MCG/ACT inhaler Inhale 2 puffs into the lungs every 6 (six) hours as needed for wheezing or shortness of breath. 18 g 3   apixaban (ELIQUIS) 5 MG TABS tablet Take 1 tablet (5 mg total) by mouth 2 (two) times daily. 180 tablet 3   Calcium-Magnesium-Zinc (CAL-MAG-ZINC PO) Take 1 tablet by mouth daily.     clonazePAM (KLONOPIN) 1 MG disintegrating tablet Take 1 mg by mouth 2 (two) times daily.     Cold Packs (ICE PACK) MISC 1 each by Other route daily. Heating pad and ice pack in towel     cyclobenzaprine (FLEXERIL) 10 MG tablet Take 20 mg by mouth daily as needed.     dorzolamide (TRUSOPT) 2 % ophthalmic solution Place 1 drop into the right eye 3 (three) times daily.     enoxaparin (LOVENOX) 150 MG/ML injection  Inject 1 mL (150 mg total) into the skin 2 (two) times daily. 5 mL 0   folic acid (FOLVITE) 1 MG tablet Take 1 mg by mouth daily.     hydrochlorothiazide (HYDRODIURIL) 50 MG tablet Take 50 mg by mouth in the morning.     ipratropium-albuterol (DUONEB) 0.5-2.5 (3) MG/3ML SOLN Take 3 mLs by nebulization every 6 (six) hours as needed. (Patient taking differently: Take 3 mLs by nebulization every 6 (six) hours as needed (COPD).) 1080 mL 3   lisinopril (ZESTRIL) 40 MG tablet Take 1 tablet (40 mg total) by mouth in the morning. 90 tablet 3   LUMIGAN 0.01 % SOLN Place 1 drop into the left eye at bedtime.     Menthol, Topical Analgesic, (ICY HOT EX) Apply 1 application  topically  daily as needed (pain).     methotrexate (RHEUMATREX) 2.5 MG tablet Take 12.5 mg by mouth See admin instructions. Take only on Tuesday's twice a day 12.5 in the morning and 12.5 mg in the evening     Multiple Vitamins-Minerals (MULTIVITAMIN WITH MINERALS) tablet Take 1 tablet by mouth daily. No iron     oxyCODONE (OXY IR/ROXICODONE) 5 MG immediate release tablet Take 1 tablet (5 mg total) by mouth every 6 (six) hours as needed for severe pain (pain score 7-10). 30 tablet 0   oxyCODONE-acetaminophen (PERCOCET) 10-325 MG tablet Take 2 tablets by mouth 2 (two) times daily.     pantoprazole (PROTONIX) 40 MG tablet Take 40 mg by mouth in the morning.     potassium chloride (KLOR-CON) 10 MEQ tablet Take 20 mEq by mouth in the morning. May take a third 10 meq tablet as needed for palpitations     Protein POWD Take 2 Scoops by mouth 3 (three) times a week.     sertraline (ZOLOFT) 100 MG tablet Take 200 mg by mouth in the morning.     traZODone (DESYREL) 50 MG tablet Take 50 mg by mouth at bedtime as needed for sleep.     verapamil (VERELAN) 360 MG 24 hr capsule Take 1 capsule (360 mg total) by mouth in the morning. (Patient taking differently: Take 360 mg by mouth in the morning. May take a second 360 mg as needed for palpitations) 90 capsule 3   No current facility-administered medications for this visit.    No Known Allergies    Social History   Socioeconomic History   Marital status: Single    Spouse name: Not on file   Number of children: Not on file   Years of education: Not on file   Highest education level: Not on file  Occupational History   Not on file  Tobacco Use   Smoking status: Former    Current packs/day: 0.00    Types: Cigarettes    Quit date: 2007    Years since quitting: 17.9   Smokeless tobacco: Never  Vaping Use   Vaping status: Never Used  Substance and Sexual Activity   Alcohol use: Yes    Alcohol/week: 2.0 standard drinks of alcohol    Types: 2 Glasses of wine  per week    Comment: Drinks wine   Drug use: Never   Sexual activity: Not Currently    Birth control/protection: Post-menopausal, None  Other Topics Concern   Not on file  Social History Narrative   Right Handed    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    Social Drivers of Health   Financial Resource Strain:  Not on file  Food Insecurity: Not on file  Transportation Needs: Not on file  Physical Activity: Not on file  Stress: Not on file  Social Connections: Unknown (12/27/2021)   Received from [facility], [facility]   Social Network    Social Network: Not on file  Intimate Partner Violence: Unknown (12/27/2021)   [facility], [facility]   HITS    Physically Hurt: Not on file    Insult or Talk Down To: Not on file    Threaten Physical Harm: Not on file    Scream or Curse: Not on file      Family History  Problem Relation Age of Onset   Hyperlipidemia Mother    Asthma Mother    Thyroid disease Mother    Depression Mother    Rheum arthritis Mother    Cancer Father        Pancreatic Cancer   Diabetes Father    Heart attack Brother    Depression Brother    HIV/AIDS Brother     PHYSICAL EXAM: There were no vitals filed for this visit. GENERAL: Well nourished, well developed, and in no apparent distress at rest.  HEENT: Negative for arcus senilis or xanthelasma. There is no scleral icterus.  The mucous membranes are pink and moist.   NECK: Supple, No masses. Normal carotid upstrokes without bruits. No masses or thyromegaly.    CHEST: There are no chest wall deformities. There is no chest wall tenderness. Respirations are unlabored.  Lungs- *** CARDIAC:  JVP: *** cm          Normal rate with regular rhythm. No murmurs, rubs or gallops.  Pulses are 2+ and symmetrical in upper and lower extremities. *** edema.  ABDOMEN: Soft, non-tender, non-distended. There are no masses or hepatomegaly. There are normal bowel sounds.  EXTREMITIES: Warm and  well perfused with no cyanosis, clubbing.  LYMPHATIC: No axillary or supraclavicular lymphadenopathy.  NEUROLOGIC: Patient is oriented x3 with no focal or lateralizing neurologic deficits.  PSYCH: Patients affect is appropriate, there is no evidence of anxiety or depression.  SKIN: Warm and dry; no lesions or wounds.     DATA REVIEW  ECG: 09/01/22: normal sinus rhythm as per my personal interpretation  ECHO: 04/25/22: LVEF 65%-70%, moderately reduced RV function as per my personal interpretation  CATH:  06/26/22 1. Significant artifact throughout study due to implanted loop recorder  2. Small LV size, mild hypertrophy, and normal systolic function (EF 65%)  3.  Normal RV size and systolic function (EF 58%)  4. Basal septal midwall LGE, which is a scar pattern seen in nonischemic cardiomyopathies and associated with a worse prognosis  5. RV insertion site LGE, which is a nonspecific scar pattern often seen in setting of elevated pulmonary pressures   ASSESSMENT & PLAN:  Evaluation for cardiac sarcoid  - Diagnosed with sarcoid in 2007 at [facility] via endobronchial biopsy - Cardiac PET in 4/24 with FDG uptake in the basal inferior/inferolateral walls consistent with LGE pattern on CMR - Her case is fairly complex; she reports having some degree of uveitis from sarcoid in addition to diffuse arthralgias. She has mentioned switching to imuran, however, I do not believe treatment with imuran only will be sufficient for treatment of her diffuse sarcoid with cardiac involvement. Could consider transition to cellcept. Ultimately, she would benefit from dual therapy with methotrexate (agree with rheumatology to start methotrexate) or cellcept AND corticosteroids. Unfortunately she mentions becoming severely depressed with nightmares  while on steroids. We had a lengthy discussion about this.  - Previously discussed started low dose prednisone; she has not started it at this time. Will continue  to hold until she sees rheumatology.  - Planning to see rheumatology in 1 week. At this time, although, she has active inflammation in the myocardium by PET, her cardiac sarcoid is fairly indolent with no clinical manifestations. Will place ziopatch to assess for arrythmias. Due to the severity of her sarcoid (eye involvement) and her inability to tolerate steroids, I will defer management to rheumatology. May require TNF-alpha inhibitor?   2. Pulmonary hypertension  - Likely due to a combination of HFpEF/OHS/sarcoid  - Will plan on RHC in the future. Averse to procedures.   3. OSA - Bipap + O2 at night.   4. 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  - Underwent right breast lumpectomy on 12/28/22; 1.38mm invasive cancer at the lateral margin positive for DCIS. Re-excision on 01/17/23 complicated by a large seroma and drainage.   5. Severe low back pain - Plan for evaluation by spine surgery pending   Dagny Fiorentino Advanced Heart Failure Mechanical Circulatory Support

## 2023-02-09 ENCOUNTER — Encounter: Payer: Self-pay | Admitting: Radiation Oncology

## 2023-02-09 ENCOUNTER — Encounter: Payer: Self-pay | Admitting: General Practice

## 2023-02-09 NOTE — Progress Notes (Signed)
Called patient referred by social worker Careers adviser.  Introduced myself as Dance movement psychotherapist and to offer available resources. Discussed one-time $1000 Marketing executive to assist with personal expenses while going through treatment. Patient advised her Radiation has been pushed back due to healing. Provided my name and number to call to arrange applying for grant and completion. She verbalized understanding and was very Adult nurse.

## 2023-02-09 NOTE — Progress Notes (Signed)
[facility] Spiritual Care Note  Spoke with Chelly by phone, providing opportunity for her to share and process health stressors, including delay of radiation due to complicated surgical healing. She values opportunities to gather and sort through her thoughts, as well as to have a witness as verbalizes them, and so we plan to follow up on Monday 12/23 for another Spiritual Care check-in. Additionally, she hopes to be able to visit in person in early January.  Yudi prefers calls and appointments after 12:30pm.   Konrad Dolores, [facility] Pager [phone] Voicemail [phone]

## 2023-02-13 ENCOUNTER — Ambulatory Visit: Payer: Medicare HMO | Admitting: Radiation Oncology

## 2023-02-13 ENCOUNTER — Ambulatory Visit: Payer: Medicare HMO

## 2023-02-19 ENCOUNTER — Encounter: Payer: Self-pay | Admitting: General Practice

## 2023-02-19 ENCOUNTER — Encounter: Payer: Self-pay | Admitting: Pulmonary Disease

## 2023-02-19 ENCOUNTER — Ambulatory Visit (INDEPENDENT_AMBULATORY_CARE_PROVIDER_SITE_OTHER): Payer: Medicare HMO | Admitting: Pulmonary Disease

## 2023-02-19 ENCOUNTER — Ambulatory Visit: Payer: Medicare HMO | Admitting: Pulmonary Disease

## 2023-02-19 VITALS — BP 121/81 | HR 91 | Temp 98.6°F | Ht 71.0 in | Wt 322.0 lb

## 2023-02-19 DIAGNOSIS — I2699 Other pulmonary embolism without acute cor pulmonale: Secondary | ICD-10-CM

## 2023-02-19 DIAGNOSIS — D869 Sarcoidosis, unspecified: Secondary | ICD-10-CM | POA: Diagnosis not present

## 2023-02-19 DIAGNOSIS — G4733 Obstructive sleep apnea (adult) (pediatric): Secondary | ICD-10-CM

## 2023-02-19 LAB — PULMONARY FUNCTION TEST
DL/VA % pred: 112 %
DL/VA: 4.54 ml/min/mmHg/L
DLCO cor % pred: 71 %
DLCO cor: 18.44 ml/min/mmHg
DLCO unc % pred: 67 %
DLCO unc: 17.53 ml/min/mmHg
FEF 25-75 Post: 2.86 L/s
FEF 25-75 Pre: 1.72 L/s
FEF2575-%Change-Post: 66 %
FEF2575-%Pred-Post: 95 %
FEF2575-%Pred-Pre: 57 %
FEV1-%Change-Post: 8 %
FEV1-%Pred-Post: 61 %
FEV1-%Pred-Pre: 56 %
FEV1-Post: 2.09 L
FEV1-Pre: 1.92 L
FEV1FVC-%Change-Post: -3 %
FEV1FVC-%Pred-Pre: 100 %
FEV6-%Change-Post: 7 %
FEV6-%Pred-Post: 60 %
FEV6-%Pred-Pre: 56 %
FEV6-Post: 2.56 L
FEV6-Pre: 2.38 L
FEV6FVC-%Change-Post: -1 %
FEV6FVC-%Pred-Post: 101 %
FEV6FVC-%Pred-Pre: 102 %
FVC-%Change-Post: 13 %
FVC-%Pred-Post: 62 %
FVC-%Pred-Pre: 55 %
FVC-Post: 2.72 L
FVC-Pre: 2.4 L
Post FEV1/FVC ratio: 77 %
Post FEV6/FVC ratio: 98 %
Pre FEV1/FVC ratio: 80 %
Pre FEV6/FVC Ratio: 99 %
RV % pred: 85 %
RV: 1.92 L
TLC % pred: 73 %
TLC: 4.48 L

## 2023-02-19 NOTE — Patient Instructions (Addendum)
Your breathing tests show mild restriction and mild diffusion defect  We will repeat an echocardiogram to follow up on your heart after the blood clots  We will schedule you for a high resolution CT Chest scan  Continue albuterol inhaler or duoneb treatments as needed for cough, wheezing, or shortness of breath  Continue Bipap with oxygen at night  Continue to work on weight loss, you have done a great job so far!  Follow up in 4 months

## 2023-02-19 NOTE — Progress Notes (Signed)
[facility] Spiritual Care Note  Followed up with Marcelino Duster by phone. She was very appreciative of call, utilizing opportunity to share and process health concerns, including the complexity of their interactions (such as needing to defer an eye treatment due to cancer diagnosis). She welcomes emotional, spiritual, and social support, particularly because some family communication is complicated. Her sister in [place] is a Paramedic, which gives Elisha a framework for valuing the power of having a set-apart professional listener.  Sancia plans to contact Spiritual Care to schedule an in-person visit when she has a more definitive treatment schedule.   [address] Rush Barer, [place], [facility] Pager [phone] Voicemail [phone]

## 2023-02-19 NOTE — Progress Notes (Signed)
Full PFT performed today. °

## 2023-02-19 NOTE — Progress Notes (Signed)
f  Synopsis: Referred in August 2022 for Sarcoidosis   Subjective:   PATIENT ID: Julie Cruz GENDER: female DOB: 09-16-1965, MRN: [id number]  HPI  Chief Complaint  Patient presents with   Follow-up   Julie Cruz is a 57 year old woman, former smoker with hypertension, pulmonary hypertension, fibromyalgia, uveitis and sarcoidosis who returns to pulmonary clinic for follow up of sarcoidosis.  She was last seen 12/19/22 by Rubye Oaks, NP. 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 She had reexcisiont 01/17/23 due to irritation at incision, complicated by large seroma and dehiscence with concern for infection in which she has been treated with doxycycline.  PFTs show mild restriction and mild diffusion defect. FVC 2.72L (62%) and FEV1 2.09L (61%) which are stable compared to prior PFTs with improved FEV1 and FVC.   She has lost 30lbs over this year. She is working on diet and activity. Continues to have significant exertional dyspnea.   The patient has been using a BiPAP machine for respiratory support, but usage has been inconsistent, often falling below the recommended four hours per night. The patient expresses discomfort with the machine and difficulty establishing a consistent routine for its use.  The patient also mentions chronic spine pain, which has been present for over 20 years and has worsened in the past 14 years following an accident.  The patient's overall health status appears to be a complex interplay of sarcoidosis, recent breast cancer diagnosis and treatment, significant weight loss, chronic pain, and vision problems. Despite these challenges, the patient expresses a desire to improve their health and continue with their weight loss journey.  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 No issues with her breathing recently. She continues on bipap  at night. (BiPAP: Max IPAP = 21     Min EPAP = 17     Pressure Support = 4  With 2L O2. )  OV 05/30/22 She was recently hospitalized 2/27 to 2/28 with acute hypoxemic respiratory failure and pulmonary emboli/DVT.   She was seen by cardiolgy 05/09/22, note reviewed, with plans for cardiac MRI and PET scans.   She is having episodes of numbness/weakness symptoms of her extremities.   She remains on methotrexate for her uveitis.   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   She has started bipap therapy and has been compliant based on her recent download.   She was seen in the ER 5/17 and 5/20 for dizziness and near syncope episodes. Evaluations were unrevealing and she reports no issues since those visits.   She currently has cold like symptoms with runny nose and nasal congestion.   OV 04/12/21 She has not started her symbicort inhaler as she is concerned about the side effects. She is using albuterol 2 to 4 times per day for shortness of breath, cough or wheezing. She recently had ophthalmology evaluation and they did not note inflammatory involvement related to sarcoidosis. She does have vision loss in her right eye due to retina issues and she is also being treated for glaucoma.   She was contacted by a nutritionist after last visit but this is not covered by her insurance. She has been referred to the weight  loss clinis.  OV 02/24/21 She tested positive for covid 19 on 12/13 and was placed on 12 day prednisone taper on 12/21 for her symptoms of cough and dyspnea. She is much improved at this time. She is using albuterol as needed for her wheezing or dyspnea and using it on a daily basis. She has not started the Symbicort inhaler that was sent in for her after  the HRCT Chest was performed showing small airways disease.   PFTs from 10/22 show a restrictive defect with normal diffusion capacity. She is scheduled for split night sleep study 03/15/20. She has ophthalmology visit next week.   She brought in papers from [place]. [facility] where she was prescribed methotrexate for sarcoidosis, uveitis and polyarthropathy. She was seen by ophthalmology and rheumatology.   OV 10/06/20 She reports being diagnosed with sarcoidosis at age 14 via lung biopsy in 2007 at [facility] in [place].  She was initially on prednisone for treatment but then due to side effects has stopped this therapy and has been weary of using methotrexate so she has not required systemic therapy over the last few years.  She denies progressive dyspnea since deciding not to have systemic treatment of her sarcoidosis. She is currently using albuterol twice daily for shortness of breath.  She was previously on Advair but did not like this inhaler.   She has history of obstructive sleep apnea and has a CPAP machine but has not used it in months as it is one of the machines included in the recent recall.  She has gained 20 to 40 pounds over the last couple of years.  Past Medical History:  Diagnosis Date   Acute medial meniscus tear    Anxiety    Arthritis    Bipolar 1 disorder (HCC)    Bronchitis 12/08/2022   Cancer (HCC)    right breast ca   CHF (congestive heart failure) (HCC)    COPD (chronic obstructive pulmonary disease) (HCC)    Depression    Dyspnea    Fibromyalgia    H/O spinal cord injury    T, L ans CSpine   Hypertension    Menopause    Myocardial infarction (HCC)    mild per patient in ? 2014   Pulmonary hypertension (HCC)    Sarcoid, cardiac    Sleep apnea    uses BiPAP nightly     Family History  Problem Relation Age of Onset   Hyperlipidemia Mother    Asthma Mother    Thyroid disease Mother    Depression Mother    Rheum arthritis Mother    Cancer Father         Pancreatic Cancer   Diabetes Father    Heart attack Brother    Depression Brother    HIV/AIDS Brother      Social History   Socioeconomic History   Marital status: Single    Spouse name: Not on file   Number of children: Not on file   Years of education: Not on file   Highest education level: Not on file  Occupational History   Not on file  Tobacco Use   Smoking status: Former    Current packs/day: 0.00    Types: Cigarettes    Quit date: 2007    Years since quitting: 17.9   Smokeless tobacco: Never  Vaping Use   Vaping status: Never Used  Substance and Sexual Activity   Alcohol use: Yes    Alcohol/week: 2.0 standard drinks  of alcohol    Types: 2 Glasses of wine per week    Comment: Drinks wine   Drug use: Never   Sexual activity: Not Currently    Birth control/protection: Post-menopausal, None  Other Topics Concern   Not on file  Social History Narrative   Right Handed    Lives in a one story home - [place]    Social Drivers of Health   Financial Resource Strain: Not on file  Food Insecurity: Food Insecurity Present (02/08/2023)   Hunger Vital Sign    Worried About Running Out of Food in the Last Year: Sometimes true    Ran Out of Food in the Last Year: Never true  Transportation Needs: Unmet Transportation Needs (02/08/2023)   PRAPARE - Administrator, Civil Service (Medical): Yes    Lack of Transportation (Non-Medical): No  Physical Activity: Not on file  Stress: Not on file  Social Connections: Unknown (12/27/2021)   Received [facility]   Social Network    Social Network: Not on file  Intimate Partner Violence: Unknown (12/27/2021)   Received from [facility]   HITS    Physically Hurt: Not on file    Insult or Talk Down To: Not on file    Threaten Physical Harm: Not on file    Scream or Curse: Not on file     No Known Allergies   Outpatient Medications Prior to Visit  Medication Sig  Dispense Refill   acetaminophen (TYLENOL) 500 MG tablet Take 500-750 mg by mouth every 8 (eight) hours as needed for mild pain (pain score 1-3) or moderate pain (pain score 4-6).     apixaban (ELIQUIS) 5 MG TABS tablet Take 1 tablet (5 mg total) by mouth 2 (two) times daily. 180 tablet 3   Calcium-Magnesium-Zinc (CAL-MAG-ZINC PO) Take 1 tablet by mouth daily.     clonazePAM (KLONOPIN) 1 MG disintegrating tablet Take 1 mg by mouth 2 (two) times daily.     Cold Packs (ICE PACK) MISC 1 each by Other route daily. Heating pad and ice pack in towel     cyclobenzaprine (FLEXERIL) 10 MG tablet Take 20 mg by mouth daily as needed.     dorzolamide (TRUSOPT) 2 % ophthalmic solution Place 1 drop into the right eye 3 (three) times daily.     folic acid (FOLVITE) 1 MG tablet Take 1 mg by mouth daily.     hydrochlorothiazide (HYDRODIURIL) 50 MG tablet Take 50 mg by mouth in the morning.     ipratropium-albuterol (DUONEB) 0.5-2.5 (3) MG/3ML SOLN Take 3 mLs by nebulization every 6 (six) hours as needed. (Patient taking differently: Take 3 mLs by nebulization every 6 (six) hours as needed (COPD).) 1080 mL 3   lisinopril (ZESTRIL) 40 MG tablet Take 1 tablet (40 mg total) by mouth in the morning. 90 tablet 3   LUMIGAN 0.01 % SOLN Place 1 drop into the left eye at bedtime.     Menthol, Topical Analgesic, (ICY HOT EX) Apply 1 application  topically daily as needed (pain).     methotrexate (RHEUMATREX) 2.5 MG tablet Take 12.5 mg by mouth See admin instructions. Take only on Tuesday's twice a day 12.5 in the morning and 12.5 mg in the evening     Multiple Vitamins-Minerals (MULTIVITAMIN WITH MINERALS) tablet Take 1 tablet by mouth daily. No iron     oxyCODONE (OXY IR/ROXICODONE) 5 MG immediate release tablet Take 1 tablet (5 mg total)  by mouth every 6 (six) hours as needed for severe pain (pain score 7-10). 30 tablet 0   oxyCODONE-acetaminophen (PERCOCET) 10-325 MG tablet Take 2 tablets by mouth 2 (two) times daily.      pantoprazole (PROTONIX) 40 MG tablet Take 40 mg by mouth in the morning.     potassium chloride (KLOR-CON) 10 MEQ tablet Take 20 mEq by mouth in the morning. May take a third 10 meq tablet as needed for palpitations     Protein POWD Take 2 Scoops by mouth 3 (three) times a week.     sertraline (ZOLOFT) 100 MG tablet Take 200 mg by mouth in the morning.     traZODone (DESYREL) 50 MG tablet Take 50 mg by mouth at bedtime as needed for sleep.     verapamil (VERELAN) 360 MG 24 hr capsule Take 1 capsule (360 mg total) by mouth in the morning. (Patient taking differently: Take 360 mg by mouth in the morning. May take a second 360 mg as needed for palpitations) 90 capsule 3   albuterol (VENTOLIN HFA) 108 (90 Base) MCG/ACT inhaler Inhale 2 puffs into the lungs every 6 (six) hours as needed for wheezing or shortness of breath. 18 g 3   enoxaparin (LOVENOX) 150 MG/ML injection Inject 1 mL (150 mg total) into the skin 2 (two) times daily. (Patient not taking: Reported on 02/19/2023) 5 mL 0   No facility-administered medications prior to visit.    Review of Systems  Constitutional:  Negative for chills, fever, malaise/fatigue and weight loss.  HENT:  Positive for congestion. Negative for sinus pain and sore throat.   Eyes:        +changes in vision  Respiratory:  Positive for cough and shortness of breath. Negative for hemoptysis, sputum production and wheezing.   Cardiovascular:  Negative for chest pain, palpitations, orthopnea, claudication and leg swelling.  Gastrointestinal:  Negative for abdominal pain, heartburn, nausea and vomiting.  Genitourinary: Negative.   Musculoskeletal:  Positive for back pain and joint pain. Negative for myalgias.  Skin:  Negative for rash.  Neurological:  Negative for weakness.  Endo/Heme/Allergies: Negative.   Psychiatric/Behavioral: Negative.      Objective:   Vitals:   02/19/23 1019  BP: 121/81  Pulse: 91  Temp: 98.6 F (37 C)  TempSrc: Oral  SpO2: 94%   Weight: (!) 322 lb (146.1 kg)  Height: 5\' 11"  (1.803 m)     Physical Exam Constitutional:      General: She is not in acute distress.    Appearance: She is obese. She is not ill-appearing.  HENT:     Head: Normocephalic and atraumatic.     Nose: Nose normal.     Mouth/Throat:     Mouth: Mucous membranes are moist.  Eyes:     General: No scleral icterus.    Conjunctiva/sclera: Conjunctivae normal.  Cardiovascular:     Rate and Rhythm: Normal rate and regular rhythm.     Pulses: Normal pulses.     Heart sounds: Normal heart sounds. No murmur heard. Pulmonary:     Effort: Pulmonary effort is normal.     Breath sounds: Decreased breath sounds present. No wheezing, rhonchi or rales.  Musculoskeletal:     Right lower leg: No edema.     Left lower leg: No edema.  Skin:    General: Skin is warm and dry.  Neurological:     General: No focal deficit present.     Mental Status: She is alert.  CBC    Component Value Date/Time   WBC 4.5 01/17/2023 1032   RBC 3.66 (L) 01/17/2023 1032   HGB 11.9 (L) 01/17/2023 1032   HGB 12.6 11/08/2022 1622   HGB 12.3 08/14/2022 1344   HCT 36.3 01/17/2023 1032   HCT 36.1 08/14/2022 1344   PLT 173 01/17/2023 1032   PLT 145 (L) 11/08/2022 1622   PLT 162 08/14/2022 1344   MCV 99.2 01/17/2023 1032   MCV 96 08/14/2022 1344   MCH 32.5 01/17/2023 1032   MCHC 32.8 01/17/2023 1032   RDW 14.9 01/17/2023 1032   RDW 15.6 (H) 08/14/2022 1344   LYMPHSABS 2.3 11/08/2022 1622   LYMPHSABS 1.6 08/14/2022 1344   MONOABS 0.6 11/08/2022 1622   EOSABS 0.1 11/08/2022 1622   EOSABS 0.1 08/14/2022 1344   BASOSABS 0.0 11/08/2022 1622   BASOSABS 0.0 08/14/2022 1344    Chest imaging: CTA Chest 04/25/22 1. Acute pulmonary embolus with moderate embolic burden and CT evidence of right heart strain (RV/LV equals 1.47). 2. Minimal scattered nodular infiltrates within the upper lobes bilaterally which may be infectious or inflammatory in the  acute setting.  CTA Chest 07/16/21 1. No evidence of pulmonary embolism. 2. Subtle perilymphatic nodular thickening most bilaterally with mild mosaic attenuation. Appearance can be seen in the setting of pulmonary sarcoidosis. A nonspecific viral bronchiolitis also result in this appearance. 3. Redemonstration of multiple enlarged mediastinal and bilateral hilar lymph nodes, likely related to history of sarcoidosis. 4. Mildly dilated main pulmonary trunk, which can be seen in the setting of pulmonary arterial hypertension.  HRCT Chest 10/19/20 1. Examination of the lungs is somewhat limited by body habitus and related photopenia. Within this limitation, there is mild, bandlike bibasilar scarring and or partial atelectasis without specific findings of pulmonary parenchymal sarcoidosis or other fibrotic interstitial lung disease. 2. Numerous prominent mediastinal and hilar lymph nodes, nonspecific although potentially in keeping with nodal sarcoidosis. 3. Lobular air trapping on expiratory phase imaging, suggestive of small airways disease. 4. Enlargement of the main pulmonary artery, as can be seen in pulmonary hypertension.  MRI Cardiac 02/07/2019 - report reviewed from records from [place] Multiple conglomerated lymph nodes within the superior prevascular, right paratracheal and bilateral hilar, precarinal and subcarinal lymph nodes measuring 3.8 x 2.8cm para tracheal nodes. Bilateral multiple subpectoral and axillary lymph nodes measuring up to 1.1 x 0.8cm. New bilateral hypermetabolic diffuse groundglass and subcentimeter nodular opacities.   PFT:    Latest Ref Rng & Units 02/19/2023    9:07 AM 12/15/2020    3:04 PM  PFT Results  FVC-Pre L 2.40  P 1.83   FVC-Predicted Pre % 55  P 50   FVC-Post L 2.72  P 1.68   FVC-Predicted Post % 62  P 45   Pre FEV1/FVC % % 80  P 79   Post FEV1/FCV % % 77  P 83   FEV1-Pre L 1.92  P 1.45   FEV1-Predicted Pre % 56  P 49   FEV1-Post L 2.09  P  1.39   DLCO uncorrected ml/min/mmHg 17.53  P 21.12   DLCO UNC% % 67  P 81   DLCO corrected ml/min/mmHg 18.44  P   DLCO COR %Predicted % 71  P   DLVA Predicted % 112  P 128   TLC L 4.48  P 4.68   TLC % Predicted % 73  P 76   RV % Predicted % 85  P 92     P Preliminary result  PFT 2022: Mild restrictive defect present  Echo 09/28/20: EF 70-75%. Mild LVH. Grade I diastolic dysfunction. RV systolic function is normal. RV size is normal. LA is mildly dilated.   EKG 09/15/20: Normal sinus rhythm, PR 160, QRS 94, Qtc 493  LHC 04/24/2017 - from [place] records LVEDP mildly elevated LV systolic function is normal  Assessment & Plan:   Sarcoidosis - Plan: CT CHEST HIGH RESOLUTION  Pulmonary embolism, other, unspecified chronicity, unspecified whether acute cor pulmonale present (HCC) - Plan: ECHOCARDIOGRAM COMPLETE  Obstructive sleep apnea  Discussion: Julie Cruz is a 57 year old woman, former smoker with hypertension, pulmonary hypertension, fibromyalgia, uveitis and sarcoidosis who returns to pulmonary clinic for follow up of sarcoidosis and DVT/PE.  Sarcoidosis She has history of sarcoidosis with confirmed lung biopsy from 2007 at [facility] in [place]. She was previously treated with high dose steroids and then recommended to start methotrexate for involvement of uveitis and polyarthropathy by rheumatology. She did not start this treatment due to concerns of side effects. HRCTChest  10/19/20 shows prominent hilar and mediastinal lymph nodes but no significant parenchymal involvement of sarcoidosis. CTA Chest 07/16/21 shows similar lymph node findings in the chest and subtle perilymphatic nodular thickening bilaterally of the mid/upper lobes with mild mosaic attenuation. Stable lung involvement. 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  -Continue Methotrexate at current dose. -Consider adding long-acting  bronchodilator or nebulizer treatment for symptom management.  - PFTs stable. Repeat high-resolution CT chest scan.  DVT/PE - continue eliquis - repeat Echo cardiogram  Breast Cancer Recent surgeries with complications including infection and delayed healing. Radiation therapy delayed due to healing issues. Clean margins after second surgery. -Continue antibiotics as prescribed. -Start radiation therapy on January 8th.  Weight Loss Significant weight loss achieved through dietary changes and increased physical activity. -Encourage continuation of current weight loss strategies.  Sleep Apnea Suboptimal BiPAP use. -Increase BiPAP use to at least 4 hours per night to maintain insurance coverage.  Schedule follow-up appointment in 4 months.  Melody Comas, MD [facility] Office: [phone]   Current Outpatient Medications:    acetaminophen (TYLENOL) 500 MG tablet, Take 500-750 mg by mouth every 8 (eight) hours as needed for mild pain (pain score 1-3) or moderate pain (pain score 4-6)., Disp: , Rfl:    apixaban (ELIQUIS) 5 MG TABS tablet, Take 1 tablet (5 mg total) by mouth 2 (two) times daily., Disp: 180 tablet, Rfl: 3   Calcium-Magnesium-Zinc (CAL-MAG-ZINC PO), Take 1 tablet by mouth daily., Disp: , Rfl:    clonazePAM (KLONOPIN) 1 MG disintegrating tablet, Take 1 mg by mouth 2 (two) times daily., Disp: , Rfl:    Cold Packs (ICE PACK) MISC, 1 each by Other route daily. Heating pad and ice pack in towel, Disp: , Rfl:    cyclobenzaprine (FLEXERIL) 10 MG tablet, Take 20 mg by mouth daily as needed., Disp: , Rfl:    dorzolamide (TRUSOPT) 2 % ophthalmic solution, Place 1 drop into the right eye 3 (three) times daily., Disp: , Rfl:    folic acid (FOLVITE) 1 MG tablet, Take 1 mg by mouth daily., Disp: , Rfl:    hydrochlorothiazide (HYDRODIURIL) 50 MG tablet, Take 50 mg by mouth in the morning., Disp: , Rfl:    ipratropium-albuterol (DUONEB) 0.5-2.5 (3) MG/3ML SOLN,  Take 3 mLs by nebulization every 6 (six) hours as needed. (Patient taking differently: Take 3 mLs by nebulization  every 6 (six) hours as needed (COPD).), Disp: 1080 mL, Rfl: 3   lisinopril (ZESTRIL) 40 MG tablet, Take 1 tablet (40 mg total) by mouth in the morning., Disp: 90 tablet, Rfl: 3   LUMIGAN 0.01 % SOLN, Place 1 drop into the left eye at bedtime., Disp: , Rfl:    Menthol, Topical Analgesic, (ICY HOT EX), Apply 1 application  topically daily as needed (pain)., Disp: , Rfl:    methotrexate (RHEUMATREX) 2.5 MG tablet, Take 12.5 mg by mouth See admin instructions. Take only on Tuesday's twice a day 12.5 in the morning and 12.5 mg in the evening, Disp: , Rfl:    Multiple Vitamins-Minerals (MULTIVITAMIN WITH MINERALS) tablet, Take 1 tablet by mouth daily. No iron, Disp: , Rfl:    oxyCODONE (OXY IR/ROXICODONE) 5 MG immediate release tablet, Take 1 tablet (5 mg total) by mouth every 6 (six) hours as needed for severe pain (pain score 7-10)., Disp: 30 tablet, Rfl: 0   oxyCODONE-acetaminophen (PERCOCET) 10-325 MG tablet, Take 2 tablets by mouth 2 (two) times daily., Disp: , Rfl:    pantoprazole (PROTONIX) 40 MG tablet, Take 40 mg by mouth in the morning., Disp: , Rfl:    potassium chloride (KLOR-CON) 10 MEQ tablet, Take 20 mEq by mouth in the morning. May take a third 10 meq tablet as needed for palpitations, Disp: , Rfl:    Protein POWD, Take 2 Scoops by mouth 3 (three) times a week., Disp: , Rfl:    sertraline (ZOLOFT) 100 MG tablet, Take 200 mg by mouth in the morning., Disp: , Rfl:    traZODone (DESYREL) 50 MG tablet, Take 50 mg by mouth at bedtime as needed for sleep., Disp: , Rfl:    verapamil (VERELAN) 360 MG 24 hr capsule, Take 1 capsule (360 mg total) by mouth in the morning. (Patient taking differently: Take 360 mg by mouth in the morning. May take a second 360 mg as needed for palpitations), Disp: 90 capsule, Rfl: 3   albuterol (VENTOLIN HFA) 108 (90 Base) MCG/ACT inhaler, Inhale 2 puffs into  the lungs every 6 (six) hours as needed for wheezing or shortness of breath., Disp: 18 g, Rfl: 3   enoxaparin (LOVENOX) 150 MG/ML injection, Inject 1 mL (150 mg total) into the skin 2 (two) times daily. (Patient not taking: Reported on 02/19/2023), Disp: 5 mL, Rfl: 0

## 2023-02-19 NOTE — Patient Instructions (Signed)
Full PFT performed today. °

## 2023-02-23 ENCOUNTER — Encounter: Payer: Self-pay | Admitting: *Deleted

## 2023-03-01 ENCOUNTER — Telehealth (HOSPITAL_COMMUNITY): Payer: Self-pay | Admitting: Surgery

## 2023-03-01 NOTE — Telephone Encounter (Signed)
 I received an email regarding patient's latest Zio monitor being returned with no data.  Patient is scheduled for [facility] follow-up for Jan 22nd and I will defer to provider to reorder if needed at that time.

## 2023-03-05 ENCOUNTER — Telehealth: Payer: Self-pay | Admitting: Pulmonary Disease

## 2023-03-05 NOTE — Telephone Encounter (Signed)
 PT calling because she is having a flare up. Please call to advise. Would like a round of antibx and pred. Same as last time.  Pharm is : [facility] [employer]   [phone]  Is having her first dose of radiation on Jan 8. Would we recommend Musinex? The home health nurse can hear congestion in her lungs. One Dr. told her no over the counter medication.

## 2023-03-05 NOTE — Telephone Encounter (Signed)
 Pt lov 12/23 with JD, pt requesting abx and pred due to copd  flare up. Experiencing sob, using neb and inhalers. States she does have chest congestion and would like to know if its okay to use mucinex. Pt has her first chemo appt on 1/8. Sending to DOD

## 2023-03-05 NOTE — Telephone Encounter (Signed)
 Called and spoke with pt, who verbalized understanding nfn

## 2023-03-05 NOTE — Telephone Encounter (Signed)
 She has sarcoidosis. I don't see a diagnosis of COPD in her chart. Tends to respond well to steroids from what I can see upon review. Would hold off on abx for now if she is not experiencing purulent sputum production, fevers. It looks like she has difficulties with higher doses of prednisone  so I will send in 20 mg for 5 days then 10 mg for 5 days then stop for sarcoid flare. Take in AM with food. She can use over the counter guaifenesin (plain Mucinex) [phone] mg Twice daily for congestion. If she's having any other viral symptoms (cough, runny nose, headaches, sore throat, nasal congestion), recommend she COVID test. Notify if positive. Continue nebs/inhalers until symptoms improve. Ensure f/u. ED precautions. Thanks.

## 2023-03-06 ENCOUNTER — Encounter: Payer: Self-pay | Admitting: General Practice

## 2023-03-06 NOTE — Progress Notes (Addendum)
 Radiation Oncology         [phone] ________________________________  Name: Julie Cruz MRN: [id number]  Date: 03/07/2023  DOB: Dec 24, 1965  Follow-Up Visit Note  Outpatient  CC: Ilah Crigler, MD  Odean Potts, MD  Diagnosis:      ICD-10-CM   1. Malignant neoplasm of lower-outer quadrant of right breast of female, estrogen receptor positive (HCC)  C50.511    Z17.0     2. Ductal carcinoma in situ (DCIS) of right breast  D05.11        Cancer Staging  Ductal carcinoma in situ (DCIS) of right breast Staging form: Breast, AJCC 8th Edition - Pathologic: No stage assigned - Unsigned  Malignant neoplasm of lower-outer quadrant of right breast of female, estrogen receptor positive (HCC) Staging form: Breast, AJCC 8th Edition - Pathologic stage from 03/08/2023: Stage IA (pT1a, pN0, cM0, G1, ER+, PR+, HER2-) - Unsigned Stage prefix: Initial diagnosis Method of lymph node assessment: Clinical Multigene prognostic tests performed: None Histologic grading system: 3 grade system   Stage 1A (pT1a, N0, M0) Right Breast LOQ, 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     Since her consultation date of 11/08/22, she opted to proceed with a right breast lumpectomy without nodal biopsies on 12/28/22 under the care of Dr. Aron. Pathology from the procedure revealed: histology of grade 1 invasive ductal carcinoma measuring 1.5 mm in the greatest extent, with extensive intermediate grade DCIS measuring 25 mm; lateral margin positive for DCIS; all margins negative for invasive carcinoma; margin status to invasive disease of 10 mm from the lateral margin;  ER status: 95% positive with strong staining intensity; PR  status 20% positive with weak to moderate staining intensity; Proliferation marker Ki67 at 2%; Her2 status negative; Grade 1.  She accordingly underwent re-excision of the positive lateral margin on 01/17/23. Pathology showed no evidence of residual carcinoma in the final lateral margin. She developed some skin irritation and some skin was also re-excised and was without evidence of malignancy.   Post-operatively, she developed a large seroma and dehisced a portion of her incision. Per Dr. Aron, at the worst point, the separation was around 2 cm. She ultimately required 2 rounds of doxycycline . During her most recent visit with Dr. Aron on 02/23/23, she was noted to report a significant decrease in drainage and pain from the site.   She was also seen in consultation by Dr. Odean on 11/08/22. With regards to antiestrogen therapy, she has a prior history of blood clots and will not be able to take tamoxifen. She should be following up with Dr. Gudena in the near future to discuss anti-hormonal treatment options further.   The patient reports to be doing well overall. She has some tenderness in the area of the seroma, but she states this continues to improve. She denies issues with range of motion or swelling.         ALLERGIES:  has no known allergies.  Meds: Current Outpatient Medications  Medication Sig Dispense Refill   acetaminophen  (TYLENOL ) 500 MG tablet Take 500-750 mg by mouth every 8 (eight) hours as needed for mild pain (pain score 1-3) or moderate pain (pain score 4-6).     apixaban  (ELIQUIS ) 5 MG TABS tablet  Take 1 tablet (5 mg total) by mouth 2 (two) times daily. 180 tablet 3   Calcium-Magnesium-Zinc (CAL-MAG-ZINC PO) Take 1 tablet by mouth daily.     clonazePAM  (KLONOPIN ) 1 MG disintegrating tablet Take 1 mg by mouth 2 (two) times daily.     Cold Packs (ICE PACK) MISC 1 each by Other route daily. Heating pad and ice pack in towel     cyclobenzaprine  (FLEXERIL ) 10 MG tablet Take  20 mg by mouth daily as needed.     dorzolamide  (TRUSOPT ) 2 % ophthalmic solution Place 1 drop into the right eye 3 (three) times daily.     folic acid (FOLVITE) 1 MG tablet Take 1 mg by mouth daily.     hydrochlorothiazide (HYDRODIURIL) 50 MG tablet Take 50 mg by mouth in the morning.     ipratropium-albuterol  (DUONEB) 0.5-2.5 (3) MG/3ML SOLN Take 3 mLs by nebulization every 6 (six) hours as needed. (Patient taking differently: Take 3 mLs by nebulization every 6 (six) hours as needed (COPD).) 1080 mL 3   lisinopril  (ZESTRIL ) 40 MG tablet Take 1 tablet (40 mg total) by mouth in the morning. 90 tablet 3   LUMIGAN 0.01 % SOLN Place 1 drop into the left eye at bedtime.     Menthol, Topical Analgesic, (ICY HOT EX) Apply 1 application  topically daily as needed (pain).     methotrexate  (RHEUMATREX) 2.5 MG tablet Take 12.5 mg by mouth See admin instructions. Take only on Tuesday's twice a day 12.5 in the morning and 12.5 mg in the evening     Multiple Vitamins-Minerals (MULTIVITAMIN WITH MINERALS) tablet Take 1 tablet by mouth daily. No iron     oxyCODONE  (OXY IR/ROXICODONE ) 5 MG immediate release tablet Take 1 tablet (5 mg total) by mouth every 6 (six) hours as needed for severe pain (pain score 7-10). 30 tablet 0   oxyCODONE -acetaminophen  (PERCOCET) 10-325 MG tablet Take 2 tablets by mouth 2 (two) times daily.     pantoprazole  (PROTONIX ) 40 MG tablet Take 40 mg by mouth in the morning.     potassium chloride  (KLOR-CON ) 10 MEQ tablet Take 20 mEq by mouth in the morning. May take a third 10 meq tablet as needed for palpitations     Protein POWD Take 2 Scoops by mouth 3 (three) times a week.     sertraline  (ZOLOFT ) 100 MG tablet Take 200 mg by mouth in the morning.     traZODone  (DESYREL ) 50 MG tablet Take 50 mg by mouth at bedtime as needed for sleep.     verapamil  (VERELAN ) 360 MG 24 hr capsule Take 1 capsule (360 mg total) by mouth in the morning. (Patient taking differently: Take 360 mg by mouth in the  morning. May take a second 360 mg as needed for palpitations) 90 capsule 3   enoxaparin  (LOVENOX ) 150 MG/ML injection Inject 1 mL (150 mg total) into the skin 2 (two) times daily. (Patient not taking: Reported on 03/07/2023) 5 mL 0   No current facility-administered medications for this encounter.    Physical Findings:  height is 5' 11 (1.803 m). Her temperature is 98.3 F (36.8 C). Her blood pressure is 152/100 (abnormal) and her pulse is 89. Her respiration is 19 and oxygen  saturation is 99%. .     General: Alert and oriented, in no acute distress HEENT: Head is normocephalic. Extraocular movements are intact. Oropharynx is clear. Neck: Neck is supple, no palpable cervical or supraclavicular lymphadenopathy. Heart: Regular in rate and rhythm with  no murmurs, rubs, or gallops. Chest: Clear to auscultation bilaterally, with no rhonchi, wheezes, or rales. Abdomen: Soft, nontender, nondistended, with no rigidity or guarding. Extremities: No cyanosis or edema. Lymphatics: see Neck Exam Musculoskeletal: symmetric strength and muscle tone throughout. Full range of motion in the right shoulder.   Neurologic: No obvious focalities. Speech is fluent.  Psychiatric: Judgment and insight are intact. Affect is appropriate. Breast exam reveals well healed surgical incision in the right breast with some scabbing, but no open skin. Scar tissue palpated beneath the incision. No evidence of residual seroma, delayed healing, or infection. Breasts are pendulous. No masses appreciated.   Lab Findings: Lab Results  Component Value Date   WBC 4.5 01/17/2023   HGB 11.9 (L) 01/17/2023   HCT 36.3 01/17/2023   MCV 99.2 01/17/2023   PLT 173 01/17/2023      Radiographic Findings: No results found.  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  A small area of incidental invasive disease was found within the DCIS.  We discussed adjuvant radiotherapy today. Patient does have a history of sarcoidosis and is currently taking Methotrexate  25 mg once/week*. Given the final surgical pathology, Dr. Izell recommends radiation to the right breast in order to decrease the risk of locoregional disease recurrence. Patient understands that because of her history of sarcoidosis, she is at an increased risk for skin irritation and permanent cosmetic effects including swelling or hardening or scar tissue in the breast. However, we believe, that in her case, the benefits of this treatment outweigh the risks.  We reviewed the logistics, benefits, risks, and potential side effects of this treatment in detail. Risks may include but not necessary be limited to acute and late injury tissue in the radiation fields such as skin irritation (change in color/pigmentation, itching, dryness, pain, peeling). She may experience fatigue. We also discussed possible risk of long term cosmetic changes or scar tissue. There is also a smaller risk for lung toxicity, lymphedema, musculoskeletal changes, rib fragility, induction of a second malignancy, or late chronic non-healing soft tissue wound.    The patient asked good questions which I answered to her satisfaction. She is enthusiastic about proceeding with treatment. A consent form has been signed and placed in her chart.  The patient will receive 40.05 Gy in 15 fractions to the right breast.  This is the most balanced approach to reduce her risk of disease recurrence while mitigating her risk of severe toxicity in the context of her comorbidities.  No boost will be given to the right breast in order to strike the best possible balance between treatment benefits and potential side effects. The patient will benefit from treatment in the prone position given her anatomy.   Patient is  interested in participating in a weight loss class offered through the cancer center. Referral to social work made today to help with this.  *She is going to start holding methotrexate  as of today and will not resume it until after she finishes treatment; Dr Izell has previously spoken with the fellow who works with her ophthalmologist who stated that if she has significant issues with her eyes while off methotrexate  they can pursue other treatment modalities to help her.  On date of service, in total, I spent 40 minutes on this encounter. Patient was seen in person.  _____________________________________    Leeroy Due, PA-C   Lauraine Golden, MD    [facility]  Radiation Oncology Direct Dial: [phone]  Fax: [phone] [email or web address]   This document serves as a record of services personally performed by Lauraine Golden, MD and Leeroy Due, PA-C. It was created on her behalf by Dorthy Fuse, a trained medical scribe. The creation of this record is based on the scribe's personal observations and the provider's statements to them. This document has been checked and approved by the attending provider.

## 2023-03-06 NOTE — Progress Notes (Signed)
 [facility] Spiritual Care Note  Followed up with Julie Cruz by phone, knowing that she would like to visit this week if possible. If she is able, she hopes to stop by Spiritual Care office before her other appointments tomorrow.  Meanwhile, she is processing lack of close support with the depth/candor that she would like. We plan to talk more about support resources that might be of interest to augment what family is able to provide.   [address] [name], [place], [facility] Pager [phone] Voicemail [phone]

## 2023-03-07 ENCOUNTER — Ambulatory Visit: Payer: Medicare HMO | Admitting: Pulmonary Disease

## 2023-03-07 ENCOUNTER — Encounter: Payer: Self-pay | Admitting: Radiation Oncology

## 2023-03-07 ENCOUNTER — Ambulatory Visit
Admission: RE | Admit: 2023-03-07 | Discharge: 2023-03-07 | Disposition: A | Discharge status: Home / Self Care | Payer: Medicare HMO | Source: Ambulatory Visit | Attending: Radiation Oncology | Admitting: Radiation Oncology

## 2023-03-07 VITALS — HR 89 | Temp 98.3°F | Resp 19

## 2023-03-07 VITALS — BP 152/100 | HR 89 | Temp 98.3°F | Resp 19 | Ht 71.0 in

## 2023-03-07 DIAGNOSIS — Z7901 Long term (current) use of anticoagulants: Secondary | ICD-10-CM | POA: Diagnosis not present

## 2023-03-07 DIAGNOSIS — H209 Unspecified iridocyclitis: Secondary | ICD-10-CM | POA: Insufficient documentation

## 2023-03-07 DIAGNOSIS — C50919 Malignant neoplasm of unspecified site of unspecified female breast: Secondary | ICD-10-CM | POA: Insufficient documentation

## 2023-03-07 DIAGNOSIS — C50511 Malignant neoplasm of lower-outer quadrant of right female breast: Secondary | ICD-10-CM | POA: Insufficient documentation

## 2023-03-07 DIAGNOSIS — D0511 Intraductal carcinoma in situ of right breast: Secondary | ICD-10-CM

## 2023-03-07 DIAGNOSIS — I5022 Chronic systolic (congestive) heart failure: Secondary | ICD-10-CM | POA: Insufficient documentation

## 2023-03-07 DIAGNOSIS — Z79899 Other long term (current) drug therapy: Secondary | ICD-10-CM | POA: Insufficient documentation

## 2023-03-07 DIAGNOSIS — Z170 Estrogen receptor positive status [ER+]: Secondary | ICD-10-CM | POA: Insufficient documentation

## 2023-03-07 DIAGNOSIS — M349 Systemic sclerosis, unspecified: Secondary | ICD-10-CM | POA: Diagnosis not present

## 2023-03-07 DIAGNOSIS — D869 Sarcoidosis, unspecified: Secondary | ICD-10-CM | POA: Insufficient documentation

## 2023-03-07 DIAGNOSIS — D8685 Sarcoid myocarditis: Secondary | ICD-10-CM | POA: Diagnosis present

## 2023-03-07 NOTE — Progress Notes (Signed)
 Location of Breast Cancer:  Malignant neoplasm of lower-outer quadrant of right breast of female, estrogen receptor positive   Histology per Pathology Report:  01/17/2023 A. SKIN, RIGHT BREAST, EXCISION:       Benign skin with ulceration and subcutaneous chronic inflammation,  scarring and fat necrosis.       Negative for atypia or malignancy.  B. BREAST, RIGHT LATERAL MARGIN, EXCISION:       Benign breast tissue with fat necrosis, granulation tissue and  fibroblastic reaction consistent with prior procedure changes.       Negative for residual carcinoma or malignancy   12/28/2022 A. BREAST, RIGHT, LUMPECTOMY: Invasive ductal carcinoma, 1.5 mm (0.15 cm), grade 1 Extensive ductal carcinoma in situ, cribriform and solid, nuclear grade 2, with necrosis, 25 mm (2.5 cm) Margins, invasive: Negative     Closest, invasive: 10 mm, lateral Margins, DCIS: Positive (See part D)     Closest, DCIS: 0 mm, lateral Lymphovascular invasion: Not identified Prognostic markers: ER positive per outside report, PR not reported Biopsy site and biopsy clip Other: Usual ductal hyperplasia See oncology table and comment B. BREAST, RIGHT ANTERIOR MARGIN, EXCISION: Benign breast tissue Anterior margin negative for carcinoma C. BREAST, RIGHT INFERIOR MARGIN, EXCISION: Benign breast tissue Inferior margin negative for carcinoma D. BREAST, RIGHT LATERAL MARGIN, EXCISION: Ductal carcinoma in situ, 1.1 cm DCIS involves lateral margin focally E. BREAST, RIGHT MEDIAL MARGIN, EXCISION: Benign breast tissue Medial margin negative for carcinoma F. BREAST, RIGHT POSTERIOR MARGIN, EXCISION: Ductal carcinoma in situ, 0.5 cm DCIS is 0.4 cm from posterior margin G. BREAST, RIGHT SUPERIOR MARGIN, EXCISION: Ductal carcinoma in situ, 0.6 cm DCIS is 0.5 cm from superior margin   Receptor Status: ER(95%), PR (20%), Her2-neu (Negative), Ki-67(2%)   Past/Anticipated interventions by surgeon, if any:  02/09/2023 Dr.  Jina Nephew (office visit) --Physical Examination:  Right breast: 1-1/2 cm area of separation in the incision. Wound overall looks better, but it is still warm with small amount of purulent material on gauze.  --Assessment and Plan:  Diagnoses and all orders for this visit: Malignant neoplasm of lower-outer quadrant of right breast of female, estrogen receptor positive (CMS/HHS-HCC) - Discontinue: doxycycline  (MONODOX ) 100 MG capsule; Take 1 capsule (100 mg total) by mouth 2 (two) times daily for 7 days - doxycycline  (MONODOX ) 100 MG capsule; Take 1 capsule (100 mg total) by mouth 2 (two) times daily for 10 days Chronic midline thoracic back pain - Ambulatory Referral to Pain Clinic Ganglion of left wrist - Ambulatory Referral to Orthopedic Surgery Refill doxy I have messaged Rad onc to move out appointment to January. Patient will not be able to take tamoxifen due to prior blood clots.  Dx mammogram due 09/2023 here or at [facility].  Refer to pain management about back injection.  Refer to hand for gangion cyst.  --Return in about 2 weeks (around 02/23/2023).   01/17/2023 --Dr. Jina Nephew  Re-excisional right Breast Lumpectomy  12/28/2022 --Dr. Jina Nephew  Right Breast Radioactive seed localized lumpectomy  Past/Anticipated interventions by medical oncology, if any:  Under care of Dr. Vinay Gudena 11/08/2022 --Recommendation: Breast conserving surgery Followed by adjuvant radiation therapy Tamoxifen is not recommended because of chronic health issues including history of DVTs, uterine hypertrophy, severe hot flashes, mood swings. --Return to clinic after surgery to discuss the final pathology report    Lymphedema issues, if any: She reports her breast feeling a little hard underneath.     Pain issues, if any:  She reports continued tenderness  and pain in the breast.   SAFETY ISSUES: Prior radiation? No Pacemaker/ICD? Yes, loop recorder but chip does not work  Possible  current pregnancy? No--postmenopausal  Is the patient on methotrexate ? Yes: only on Tuesday's twice a day 12.5 in the morning and 12.5 mg in the evening   Current Complaints / other details:

## 2023-03-08 ENCOUNTER — Encounter: Payer: Self-pay | Admitting: Cardiology

## 2023-03-08 ENCOUNTER — Telehealth: Payer: Self-pay | Admitting: Licensed Clinical Social Worker

## 2023-03-08 ENCOUNTER — Other Ambulatory Visit: Payer: Self-pay | Admitting: Radiology

## 2023-03-08 DIAGNOSIS — C50511 Malignant neoplasm of lower-outer quadrant of right female breast: Secondary | ICD-10-CM | POA: Insufficient documentation

## 2023-03-08 NOTE — Progress Notes (Signed)
 TO BE COMPLETED BY RADIATION ONCOLOGIST OFFICE:   Scheduled start date of radiation therapy: 03/14/2023  Scheduled end date of radiation therapy: 04/03/2023  Site to be treated: Right Breast  Radiation Oncologist: Dr. Lauraine Golden   TO BE COMPLETED BY CARDIOLOGIST OFFICE:   Device Information:  Implantable Loop Recorder  Brand: [employer]: [phone] Model #: [id number] Reveal LINQ  Serial Number: [id number]     Date of Placement: 04/24/2017  Site of Placement: left chest   **PLEASE LIST ANY NOTES OR SPECIAL REQUESTS: Loop recorder has been out of service since 12/24/2020.  Loop was abandoned.      CARDIOLOGIST SIGNATURE:  Dr. Soyla Norton Per Device Clinic Standing Orders, Delon DELENA Sharps  03/08/2023 11:38 AM  **Please route completed form back to Radiation Oncology Nursing and P [facility] RAD ONC ADMIN, OR send an update if there will be a delay in having form completed by expected start date.  **Call [phone] if you have any questions or do not get an in-basket response from a Radiation Oncology staff member

## 2023-03-08 NOTE — Telephone Encounter (Signed)
 Received referral that pt is interested in nutrition class. CSW attempted to contact pt via phone. No answer. Left VM with next class date and will add pt's e-mail to the class list so she will receive the virtual link.  Left direct contact information for support services if pt has other questions about the class.   Franchon Ketterman E Domino Holten, LCSW

## 2023-03-09 ENCOUNTER — Encounter: Payer: Self-pay | Admitting: Radiation Oncology

## 2023-03-12 DIAGNOSIS — C50511 Malignant neoplasm of lower-outer quadrant of right female breast: Secondary | ICD-10-CM | POA: Diagnosis not present

## 2023-03-13 ENCOUNTER — Inpatient Hospital Stay: Payer: Medicare HMO

## 2023-03-13 ENCOUNTER — Ambulatory Visit
Admission: RE | Admit: 2023-03-13 | Discharge: 2023-03-13 | Disposition: A | Discharge status: Home / Self Care | Payer: Medicare HMO | Source: Ambulatory Visit | Attending: Radiation Oncology | Admitting: Radiation Oncology

## 2023-03-13 ENCOUNTER — Other Ambulatory Visit: Payer: Self-pay

## 2023-03-13 DIAGNOSIS — C50511 Malignant neoplasm of lower-outer quadrant of right female breast: Secondary | ICD-10-CM | POA: Diagnosis not present

## 2023-03-13 DIAGNOSIS — R519 Headache, unspecified: Secondary | ICD-10-CM | POA: Insufficient documentation

## 2023-03-13 DIAGNOSIS — Z170 Estrogen receptor positive status [ER+]: Secondary | ICD-10-CM | POA: Insufficient documentation

## 2023-03-13 LAB — RAD ONC ARIA SESSION SUMMARY
Course Elapsed Days: 0
Plan Fractions Treated to Date: 1
Plan Prescribed Dose Per Fraction: 2.67 Gy
Plan Total Fractions Prescribed: 15
Plan Total Prescribed Dose: 40.05 Gy
Reference Point Dosage Given to Date: 2.67 Gy
Reference Point Session Dosage Given: 2.67 Gy
Session Number: 1

## 2023-03-14 ENCOUNTER — Ambulatory Visit: Payer: Medicare HMO | Admitting: Radiation Oncology

## 2023-03-14 ENCOUNTER — Telehealth: Payer: Self-pay | Admitting: Dietician

## 2023-03-14 ENCOUNTER — Inpatient Hospital Stay: Payer: Medicare HMO

## 2023-03-14 ENCOUNTER — Other Ambulatory Visit: Payer: Medicare HMO

## 2023-03-14 ENCOUNTER — Ambulatory Visit
Admission: RE | Admit: 2023-03-14 | Discharge: 2023-03-14 | Disposition: A | Discharge status: Home / Self Care | Payer: Medicare HMO | Source: Ambulatory Visit | Attending: Radiation Oncology | Admitting: Radiation Oncology

## 2023-03-14 ENCOUNTER — Telehealth: Payer: Self-pay | Admitting: Licensed Clinical Social Worker

## 2023-03-14 ENCOUNTER — Other Ambulatory Visit: Payer: Self-pay

## 2023-03-14 ENCOUNTER — Encounter: Payer: Self-pay | Admitting: *Deleted

## 2023-03-14 DIAGNOSIS — C50511 Malignant neoplasm of lower-outer quadrant of right female breast: Secondary | ICD-10-CM | POA: Diagnosis not present

## 2023-03-14 DIAGNOSIS — D0511 Intraductal carcinoma in situ of right breast: Secondary | ICD-10-CM

## 2023-03-14 LAB — RAD ONC ARIA SESSION SUMMARY
Course Elapsed Days: 1
Plan Fractions Treated to Date: 2
Plan Prescribed Dose Per Fraction: 2.67 Gy
Plan Total Fractions Prescribed: 15
Plan Total Prescribed Dose: 40.05 Gy
Reference Point Dosage Given to Date: 5.34 Gy
Reference Point Session Dosage Given: 2.67 Gy
Session Number: 2

## 2023-03-14 NOTE — Telephone Encounter (Signed)
 [facility] Clinical Social Work  CSW received message from A. Bevin Bucks that pt called inquiring about financial assistance now that she has started radiation. CSW attempted to cal pt back. No answer, no voicemail available. Sent message through [employer].    Doyal Saric E Yosiel Thieme, LCSW

## 2023-03-14 NOTE — Telephone Encounter (Signed)
 Patient signed up for Virtual nutrition class but conflicts with radiation appointments,  Offered remote consult, scheduled for 03/14/22.   Carleen Chary, RDN, LDN Registered Dietitian, [facility] Part Time Remote (Usual office hours: Tuesday-Thursday) Mobile: [phone] Remote Office: [phone]

## 2023-03-15 ENCOUNTER — Other Ambulatory Visit: Payer: Self-pay

## 2023-03-15 ENCOUNTER — Inpatient Hospital Stay: Payer: Medicare HMO | Admitting: Dietician

## 2023-03-15 ENCOUNTER — Inpatient Hospital Stay: Payer: Medicare HMO

## 2023-03-15 ENCOUNTER — Ambulatory Visit
Admission: RE | Admit: 2023-03-15 | Discharge: 2023-03-15 | Disposition: A | Discharge status: Home / Self Care | Payer: Medicare HMO | Source: Ambulatory Visit | Attending: Radiation Oncology | Admitting: Radiation Oncology

## 2023-03-15 DIAGNOSIS — C50511 Malignant neoplasm of lower-outer quadrant of right female breast: Secondary | ICD-10-CM | POA: Diagnosis not present

## 2023-03-15 LAB — RAD ONC ARIA SESSION SUMMARY
Course Elapsed Days: 2
Plan Fractions Treated to Date: 3
Plan Prescribed Dose Per Fraction: 2.67 Gy
Plan Total Fractions Prescribed: 15
Plan Total Prescribed Dose: 40.05 Gy
Reference Point Dosage Given to Date: 8.01 Gy
Reference Point Session Dosage Given: 2.67 Gy
Session Number: 3

## 2023-03-15 NOTE — Progress Notes (Signed)
Nutrition Assessment  Called patient at her home telephone#  Patient is a 58 year old woman with right breast consistent with DCIS s/p lumpectomy. She had re excision 01/17/23 due to irritation at incision, complicated by large seroma and dehiscence with concern for infection in which she was been treated with doxycycline.  She is a former smoker with hypertension, pulmonary hypertension, fibromyalgia, uveitis and sarcoidosis. She reports she eats a lot of fruits, vegetables, nuts.  She doesn't eat red meat. She has cut down on portions and reduced her alcohol.  Drinks a lot of water and herbal.  Anthropometrics:  Intentionally lost 17# in 2024, has lost 50#s overall in 18 months  Height: 71" Weight: 322# DBW: 190# BMI: 44.91   NUTRITION DIAGNOSIS:  Food and Nutrition Related Knowledge Deficit related to cancer and associated treatments as evidenced by no prior need for nutrition related information.   INTERVENTION:  Patient was scheduled in the Nutrition and Cancer virtual webinar.  Due to a scheduling conflict offered a 1 on 1 consult review of AICR guidelines, [facility] nutrition resources for survivors were encouraged (Nutrition 101, cooking classes, individual MNT with outpatient RDs in weight management and DM management).  Emailed goals of MNT during treatment and Goals of treatment during survivorship, AICR info graphic with contact information provided.   MONITORING, EVALUATION, GOAL: weight, PO intake, Nutrition Impact Symptoms, labs Goal is weight maintenance.  Next Visit: PRN at patient or provider request  Gennaro Africa, RDN, LDN Registered Dietitian, [facility] Part Time Remote (Usual office hours: Tuesday-Thursday) Cell: [phone]

## 2023-03-16 ENCOUNTER — Inpatient Hospital Stay: Payer: Medicare HMO

## 2023-03-16 ENCOUNTER — Telehealth: Payer: Self-pay | Admitting: Radiation Oncology

## 2023-03-16 ENCOUNTER — Encounter: Payer: Self-pay | Admitting: Radiation Oncology

## 2023-03-16 ENCOUNTER — Other Ambulatory Visit: Payer: Self-pay

## 2023-03-16 ENCOUNTER — Encounter: Payer: Self-pay | Admitting: General Practice

## 2023-03-16 ENCOUNTER — Ambulatory Visit
Admission: RE | Admit: 2023-03-16 | Discharge: 2023-03-16 | Disposition: A | Discharge status: Home / Self Care | Payer: Medicare HMO | Source: Ambulatory Visit | Attending: Radiation Oncology

## 2023-03-16 DIAGNOSIS — C50511 Malignant neoplasm of lower-outer quadrant of right female breast: Secondary | ICD-10-CM | POA: Diagnosis not present

## 2023-03-16 LAB — RAD ONC ARIA SESSION SUMMARY
Course Elapsed Days: 3
Plan Fractions Treated to Date: 4
Plan Prescribed Dose Per Fraction: 2.67 Gy
Plan Total Fractions Prescribed: 15
Plan Total Prescribed Dose: 40.05 Gy
Reference Point Dosage Given to Date: 10.68 Gy
Reference Point Session Dosage Given: 2.67 Gy
Session Number: 4

## 2023-03-16 MED ORDER — RADIAPLEXRX EX GEL: Freq: Once | CUTANEOUS | Status: AC

## 2023-03-16 MED ORDER — ALRA NON-METALLIC DEODORANT (RAD-ONC)
1.0000 | Freq: Once | TOPICAL | Status: AC
  Administered 2023-03-16: 1 via TOPICAL

## 2023-03-16 NOTE — Progress Notes (Signed)
Received call from patient whom we had spoken before.  Discussed one-time $1000 [employer] grant to assist with personal expenses while going through treatment. Advised what is needed to apply. She will bring at next appointment 03/19/23 and provide to registrar after Radiation to be scanned and emailed to me. Advised to contact me at earliest convenience after appointment and she has reviewed paperwork for [name] to discuss.   She has my card to do so and for any additional financial questions or concerns.

## 2023-03-16 NOTE — Telephone Encounter (Signed)
Pt called to confirm appt times for transportation next week. I reviewed times in both Epic and [name] and was able to confirm times listed in MyChart are correct and Monday 1/20 is @ 12:00pm. Pt verbalized gratitude for clarification. Pt stated she would leave message for Julie Cruz about correct time. I advised I would route note to him as well to guarantee correct transport time.

## 2023-03-16 NOTE — Progress Notes (Signed)
[facility] Spiritual Care Note  Met Articia in person today for the first time after building a support relationship by phone. She was very appreciative of connecting between appointments. We plan to follow up in more detail by phone next week.   [address] [place], [place], [facility] Pager [phone] Voicemail [phone]

## 2023-03-19 ENCOUNTER — Ambulatory Visit: Payer: Medicare HMO

## 2023-03-19 ENCOUNTER — Encounter: Payer: Self-pay | Admitting: General Practice

## 2023-03-19 NOTE — Progress Notes (Signed)
[facility] Spiritual Care Note  Followed up with Marcelino Duster by phone. Instead of a Spiritual Care encounter, however, we focused on addressing the headache she reports having had since Saturday.  Consulted with Dr Pamelia Hoit and his nurse Porsche Cates/LPN, who made a symptom management appointment with Mardella Layman Causey/NP tomorrow preceding Arianie's radiation appointment. Also contacted Dorna Leitz, who adjusted the transportation pickup time accordingly.  Ensured that Jacquese is aware of the changes; she reports relief at knowing that her symptoms will be addressed directly.  Meanwhile, we plan to follow up by phone to schedule a formal Spiritual Care appointment in chaplain's office after a radiation appointment when convenient.   [address] [place], [place], [facility] Pager [phone] Voicemail [phone]

## 2023-03-20 ENCOUNTER — Ambulatory Visit
Admission: RE | Admit: 2023-03-20 | Discharge: 2023-03-20 | Disposition: A | Discharge status: Home / Self Care | Payer: Medicare HMO | Source: Ambulatory Visit | Attending: Radiation Oncology | Admitting: Radiation Oncology

## 2023-03-20 ENCOUNTER — Inpatient Hospital Stay: Payer: Medicare HMO

## 2023-03-20 ENCOUNTER — Inpatient Hospital Stay: Payer: Medicare HMO | Admitting: Adult Health

## 2023-03-20 ENCOUNTER — Other Ambulatory Visit: Payer: Self-pay

## 2023-03-20 ENCOUNTER — Ambulatory Visit: Payer: Medicare HMO

## 2023-03-20 VITALS — BP 134/84 | HR 85 | Temp 97.8°F | Resp 18 | Ht 71.0 in | Wt 318.0 lb

## 2023-03-20 DIAGNOSIS — G4452 New daily persistent headache (NDPH): Secondary | ICD-10-CM

## 2023-03-20 DIAGNOSIS — C50511 Malignant neoplasm of lower-outer quadrant of right female breast: Secondary | ICD-10-CM | POA: Diagnosis not present

## 2023-03-20 DIAGNOSIS — Z170 Estrogen receptor positive status [ER+]: Secondary | ICD-10-CM

## 2023-03-20 LAB — RAD ONC ARIA SESSION SUMMARY
Course Elapsed Days: 7
Plan Fractions Treated to Date: 5
Plan Prescribed Dose Per Fraction: 2.67 Gy
Plan Total Fractions Prescribed: 15
Plan Total Prescribed Dose: 40.05 Gy
Reference Point Dosage Given to Date: 13.35 Gy
Reference Point Session Dosage Given: 2.67 Gy
Session Number: 5

## 2023-03-20 MED ORDER — KETOROLAC TROMETHAMINE 30 MG/ML IJ SOLN
60.0000 mg | Freq: Once | INTRAMUSCULAR | Status: AC
  Administered 2023-03-20: 60 mg via INTRAMUSCULAR
  Filled 2023-03-20: qty 2

## 2023-03-20 NOTE — Addendum Note (Signed)
Encounter addended by: Lonie Peak, MD on: 03/20/2023 1:28 PM  Actions taken: Clinical Note Signed

## 2023-03-20 NOTE — Patient Instructions (Signed)
Ketorolac Injection What is this medication? KETOROLAC (kee toe ROLE ak) treats short-term moderate to severe pain. It works by decreasing inflammation. It belongs to a group of medications called NSAIDs. This medicine may be used for other purposes; ask your health care provider or pharmacist if you have questions. COMMON BRAND NAME(S): Toradol What should I tell my care team before I take this medication? They need to know if you have any of these conditions: Bleeding disorder Coronary artery bypass graft (CABG) within the past 2 weeks Frequently drink alcohol Heart attack Heart disease Heart failure High blood pressure Kidney disease Liver disease Lung or breathing disease, such as asthma or COPD Receiving steroids, such as dexamethasone or prednisone Stomach bleeding Stomach ulcers, other stomach or intestine problems Take medication to treat or prevent blood clots Tobacco use An unusual or allergic reaction to ketorolac, other medications, foods, dyes, or preservatives Pregnant or trying to get pregnant Breastfeeding How should I use this medication? This medication is injected into a vein or muscle. It is given by your care team in a hospital or clinic setting. A special MedGuide will be given to you before each treatment. Be sure to read this information carefully each time. Talk to your care team about the use of this medication in children. Special care may be needed. People 65 years and older may have a stronger reaction and need a smaller dose. Overdosage: If you think you have taken too much of this medicine contact a poison control center or emergency room at once. NOTE: This medicine is only for you. Do not share this medicine with others. What if I miss a dose? This does not apply. This medication is not for regular use. What may interact with this medication? Do not take this medication with any of the following: Aspirin and aspirin-like  medications Cidofovir Methotrexate NSAIDs, medications for pain and inflammation, such as ibuprofen or naproxen Pentoxifylline Probenecid This medication may also interact with the following: Alcohol Alendronate Alprazolam Carbamazepine Diuretics Flavocoxid Fluoxetine Ginkgo Lithium Medications for blood pressure, such as enalapril Medications that affect platelets, such as pentoxifylline Medications that prevent or treat blood clots, such as heparin or warfarin Medications that relax muscles Pemetrexed Phenytoin Thiothixene This list may not describe all possible interactions. Give your health care provider a list of all the medicines, herbs, non-prescription drugs, or dietary supplements you use. Also tell them if you smoke, drink alcohol, or use illegal drugs. Some items may interact with your medicine. What should I watch for while using this medication? Your condition will be monitored carefully while you are receiving this medication. Do not use this medication for more than 5 days. It is only used for short-term treatment of moderate to severe pain. The risk of side effects such as kidney damage and stomach bleeding are higher if used for more than 5 days. Do not take other medications that contain aspirin, ibuprofen, or naproxen with this medication. Side effects such as stomach upset, nausea, or ulcers may be more likely to occur. Many non-prescription medications contain aspirin, ibuprofen, or naproxen. Always read labels carefully. This medication can cause serious ulcers and bleeding in the stomach. It can happen with no warning. Tobacco, alcohol, older age, and poor health can also increase risks. Call your care team right away if you have stomach pain or blood in your vomit or stool. This medication may cause serious skin reactions. They can happen weeks to months after starting the medication. Contact your care team right away  if you notice fevers or flu-like symptoms with  a rash. The rash may be red or purple and then turn into blisters or peeling of the skin. You may also notice a red rash with swelling of the face, lips, or lymph nodes in your neck or under your arms. Talk to your care team if you wish to become pregnant or think you might be pregnant. This medication can cause serious birth defects. This medication does not prevent a heart attack or stroke. This medication may increase the chance of a heart attack or stroke. The chance may increase the longer you use this medication or if you have heart disease. If you take aspirin to prevent a heart attack or stroke, talk to your care team about using this medication. This medication may affect your coordination, reaction time, or judgment. Do not drive or operate machinery until you know how this medication affects you. Sit up or stand slowly to reduce the risk of dizzy or fainting spells. Drinking alcohol with this medication can increase the risk of these side effects. What side effects may I notice from receiving this medication? Side effects that you should report to your care team as soon as possible: Allergic reactions--skin rash, itching, hives, swelling of the face, lips, tongue, or throat Heart attack--pain or tightness in the chest, shoulders, arms, or jaw, nausea, shortness of breath, cold or clammy skin, feeling faint or lightheaded Heart failure--shortness of breath, swelling of ankles, feet, or hands, sudden weight gain, unusual weakness or fatigue Increase in blood pressure Kidney injury--decrease in the amount of urine, swelling of the ankles, hands, or feet Liver injury--right upper belly pain, loss of appetite, nausea, light-colored stool, dark yellow or brown urine, yellowing skin or eyes, unusual weakness or fatigue Low red blood cell count--unusual weakness or fatigue, dizziness, headache, trouble breathing Rash, fever, and swollen lymph nodes Redness, blistering, peeling, or loosening of the  skin, including inside the mouth Stomach bleeding--bloody or black, tar-like stools, vomiting blood or brown material that looks like coffee grounds Stroke--sudden numbness or weakness of the face, arm, or leg, trouble speaking, confusion, trouble walking, loss of balance or coordination, dizziness, severe headache, change in vision Side effects that usually do not require medical attention (report to your care team if they continue or are bothersome): Constipation Diarrhea Dizziness Headache Nausea Stomach pain This list may not describe all possible side effects. Call your doctor for medical advice about side effects. You may report side effects to FDA at 1-800-FDA-1088. Where should I keep my medication? This medication is given in a hospital or clinic. It will not be stored at home. NOTE: This sheet is a summary. It may not cover all possible information. If you have questions about this medicine, talk to your doctor, pharmacist, or health care provider.  2024 Elsevier/Gold Standard (2021-08-22 00:00:00)

## 2023-03-20 NOTE — Progress Notes (Unsigned)
[facility] Cancer Follow up:    Leilani Able, MD [address] [place] [address]   DIAGNOSIS: Cancer Staging  Ductal carcinoma in situ (DCIS) of right breast Staging form: Breast, AJCC 8th Edition - Pathologic: No stage assigned - Unsigned  Malignant neoplasm of lower-outer quadrant of right breast of female, estrogen receptor positive (HCC) Staging form: Breast, AJCC 8th Edition - Pathologic stage from 03/08/2023: Stage IA (pT1a, pN0, cM0, G1, ER+, PR+, HER2-) - Unsigned Stage prefix: Initial diagnosis Method of lymph node assessment: Clinical Multigene prognostic tests performed: None Histologic grading system: 3 grade system   SUMMARY OF ONCOLOGIC HISTORY: Oncology History  Ductal carcinoma in situ (DCIS) of right breast  10/26/2022 Initial Diagnosis   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  Mammogram detected 2.7 cm calcifications which on biopsy came back as DCIS ER positive.     CURRENT THERAPY:  INTERVAL HISTORY: Julie Cruz 58 y.o. female returns for    Patient Active Problem List   Diagnosis Date Noted   Malignant neoplasm of lower-outer quadrant of right breast of female, estrogen receptor positive (HCC) 03/08/2023   Ductal carcinoma in situ (DCIS) of right breast 10/26/2022   Chronic systolic heart failure (HCC) 09/01/2022   Acute pulmonary embolism (HCC) 04/25/2022   Hypokalemia 04/25/2022   Prolonged QT interval 04/25/2022   GAD (generalized anxiety disorder) 04/25/2022   Essential hypertension 04/25/2022   Chronic pain syndrome 04/25/2022   GERD (gastroesophageal reflux disease) 04/25/2022   Acute DVT (deep venous thrombosis) (HCC) 04/25/2022   Uveitic glaucoma of right eye 04/25/2022   Obstructive sleep apnea 03/15/2021   Sarcoidosis 09/15/2020   Status post placement of implantable loop recorder 09/15/2020   Syncope and collapse 09/15/2020   Dyspnea on exertion 09/15/2020   Morbid obesity (HCC)  09/15/2020    has no known allergies.  MEDICAL HISTORY: Past Medical History:  Diagnosis Date   Acute medial meniscus tear    Anxiety    Arthritis    Bipolar 1 disorder (HCC)    Bronchitis 12/08/2022   Cancer (HCC)    right breast ca   CHF (congestive heart failure) (HCC)    COPD (chronic obstructive pulmonary disease) (HCC)    Depression    Dyspnea    Fibromyalgia    H/O spinal cord injury    T, L ans CSpine   Hypertension    Menopause    Myocardial infarction (HCC)    mild per patient in ? 2014   Pulmonary hypertension (HCC)    Sarcoid, cardiac    Sleep apnea    uses BiPAP nightly    SURGICAL HISTORY: Past Surgical History:  Procedure Laterality Date   BREAST BIOPSY  12/27/2022   MM RT RADIOACTIVE SEED LOC MAMMO GUIDE 12/27/2022 [facility] MAMMOGRAPHY   BREAST LUMPECTOMY WITH RADIOACTIVE SEED LOCALIZATION Right 12/28/2022   Procedure: RIGHT BREAST SEED LOCALIZED LUMPECTOMY;  Surgeon: Almond Lint, MD;  Location: [facility] OR;  Service: General;  Laterality: Right;   C Section     x 1   CARDIAC CATHETERIZATION     x 2   COLONOSCOPY WITH PROPOFOL N/A 10/15/2020   Procedure: COLONOSCOPY WITH PROPOFOL;  Surgeon: Jeani Hawking, MD;  Location: [facility] ENDOSCOPY;  Service: Endoscopy;  Laterality: N/A;   Etopic  pregnacy     EYE SURGERY Right 03/08/2022   glaucoma and cataracts   LUNG BIOPSY     RE-EXCISION OF BREAST LUMPECTOMY Right 01/17/2023   Procedure: RE-EXCISION RIGHT  BREAST LUMPECTOMY;  Surgeon: Almond Lint, MD;  Location: [facility] OR;  Service: General;  Laterality: Right;  PEC BLOCK   TUBAL LIGATION     UPPER GI ENDOSCOPY      SOCIAL HISTORY: Social History   Socioeconomic History   Marital status: Single    Spouse name: Not on file   Number of children: Not on file   Years of education: Not on file   Highest education level: Not on file  Occupational History   Not on file  Tobacco Use   Smoking status: Former    Current packs/day: 0.00    Types: Cigarettes     Quit date: 2007    Years since quitting: 18.0   Smokeless tobacco: Never  Vaping Use   Vaping status: Never Used  Substance and Sexual Activity   Alcohol use: Yes    Alcohol/week: 2.0 standard drinks of alcohol    Types: 2 Glasses of wine per week    Comment: Drinks wine   Drug use: Never   Sexual activity: Not Currently    Birth control/protection: Post-menopausal, None  Other Topics Concern   Not on file  Social History Narrative   Right Handed    Lives in a one story home - [place]    Social Drivers of Health   Financial Resource Strain: Not on file  Food Insecurity: Food Insecurity Present (02/08/2023)   Hunger Vital Sign    Worried About Running Out of Food in the Last Year: Sometimes true    Ran Out of Food in the Last Year: Never true  Transportation Needs: Unmet Transportation Needs (02/08/2023)   PRAPARE - Administrator, Civil Service (Medical): Yes    Lack of Transportation (Non-Medical): No  Physical Activity: Not on file  Stress: Not on file  Social Connections: Unknown (12/27/2021)   [facility] from [facility]   Social Network    Social Network: Not on file  Intimate Partner Violence: Unknown (12/27/2021)   Received from [facility], [facility]   HITS    Physically Hurt: Not on file    Insult or Talk Down To: Not on file    Threaten Physical Harm: Not on file    Scream or Curse: Not on file    FAMILY HISTORY: Family History  Problem Relation Age of Onset   Hyperlipidemia Mother    Asthma Mother    Thyroid disease Mother    Depression Mother    Rheum arthritis Mother    Cancer Father        Pancreatic Cancer   Diabetes Father    Heart attack Brother    Depression Brother    HIV/AIDS Brother     Review of Systems - Oncology    PHYSICAL EXAMINATION    Vitals:   03/20/23 1124  BP: 134/84  Pulse: 85  Resp: 18  Temp: 97.8 F (36.6 C)  SpO2: 97%    Physical Exam  LABORATORY DATA:  CBC     Component Value Date/Time   WBC 4.5 01/17/2023 1032   RBC 3.66 (L) 01/17/2023 1032   HGB 11.9 (L) 01/17/2023 1032   HGB 12.6 11/08/2022 1622   HGB 12.3 08/14/2022 1344   HCT 36.3 01/17/2023 1032   HCT 36.1 08/14/2022 1344   PLT 173 01/17/2023 1032   PLT 145 (L) 11/08/2022 1622   PLT 162 08/14/2022 1344   MCV 99.2 01/17/2023 1032   MCV 96 08/14/2022 1344   MCH 32.5  01/17/2023 1032   MCHC 32.8 01/17/2023 1032   RDW 14.9 01/17/2023 1032   RDW 15.6 (H) 08/14/2022 1344   LYMPHSABS 2.3 11/08/2022 1622   LYMPHSABS 1.6 08/14/2022 1344   MONOABS 0.6 11/08/2022 1622   EOSABS 0.1 11/08/2022 1622   EOSABS 0.1 08/14/2022 1344   BASOSABS 0.0 11/08/2022 1622   BASOSABS 0.0 08/14/2022 1344    CMP     Component Value Date/Time   NA 139 01/17/2023 1032   NA 142 08/14/2022 1344   K 3.4 (L) 01/17/2023 1032   CL 100 01/17/2023 1032   CO2 27 01/17/2023 1032   GLUCOSE 94 01/17/2023 1032   BUN 8 01/17/2023 1032   BUN 10 08/14/2022 1344   CREATININE 1.08 (H) 01/17/2023 1032   CREATININE 1.05 (H) 11/08/2022 1622   CALCIUM 9.1 01/17/2023 1032   PROT 6.9 12/08/2022 0139   PROT 7.1 08/14/2022 1344   ALBUMIN 3.7 12/08/2022 0139   ALBUMIN 4.4 08/14/2022 1344   AST 30 12/08/2022 0139   AST 26 11/08/2022 1622   ALT 25 12/08/2022 0139   ALT 24 11/08/2022 1622   ALKPHOS 53 12/08/2022 0139   BILITOT 0.4 12/08/2022 0139   BILITOT 0.5 11/08/2022 1622   GFRNONAA >60 01/17/2023 1032   GFRNONAA >60 11/08/2022 1622       PENDING LABS:   RADIOGRAPHIC STUDIES:  No results found.   PATHOLOGY:     ASSESSMENT and THERAPY PLAN:   No problem-specific Assessment & Plan notes found for this encounter.   No orders of the defined types were placed in this encounter.   All questions were answered. The patient knows to call the clinic with any problems, questions or concerns. We can certainly see the patient much sooner if necessary. This note was electronically signed. Noreene Filbert,  NP 03/20/2023

## 2023-03-21 ENCOUNTER — Other Ambulatory Visit: Payer: Self-pay

## 2023-03-21 ENCOUNTER — Ambulatory Visit
Admission: RE | Admit: 2023-03-21 | Discharge: 2023-03-21 | Disposition: A | Discharge status: Home / Self Care | Payer: Medicare HMO | Source: Ambulatory Visit | Attending: Radiation Oncology

## 2023-03-21 ENCOUNTER — Encounter: Payer: Self-pay | Admitting: Adult Health

## 2023-03-21 ENCOUNTER — Ambulatory Visit (HOSPITAL_BASED_OUTPATIENT_CLINIC_OR_DEPARTMENT_OTHER)
Admission: RE | Admit: 2023-03-21 | Discharge: 2023-03-21 | Disposition: A | Discharge status: Home / Self Care | Payer: Medicare HMO | Source: Ambulatory Visit | Attending: Cardiology | Admitting: Cardiology

## 2023-03-21 ENCOUNTER — Encounter: Payer: Self-pay | Admitting: General Practice

## 2023-03-21 ENCOUNTER — Encounter (HOSPITAL_COMMUNITY): Payer: Self-pay | Admitting: Cardiology

## 2023-03-21 VITALS — BP 116/81 | HR 83 | Ht 71.0 in | Wt 318.0 lb

## 2023-03-21 DIAGNOSIS — C50919 Malignant neoplasm of unspecified site of unspecified female breast: Secondary | ICD-10-CM

## 2023-03-21 DIAGNOSIS — D8685 Sarcoid myocarditis: Secondary | ICD-10-CM

## 2023-03-21 DIAGNOSIS — D869 Sarcoidosis, unspecified: Secondary | ICD-10-CM

## 2023-03-21 DIAGNOSIS — Z170 Estrogen receptor positive status [ER+]: Secondary | ICD-10-CM | POA: Diagnosis not present

## 2023-03-21 DIAGNOSIS — H209 Unspecified iridocyclitis: Secondary | ICD-10-CM

## 2023-03-21 DIAGNOSIS — C50511 Malignant neoplasm of lower-outer quadrant of right female breast: Secondary | ICD-10-CM | POA: Diagnosis not present

## 2023-03-21 DIAGNOSIS — I5022 Chronic systolic (congestive) heart failure: Secondary | ICD-10-CM

## 2023-03-21 LAB — RAD ONC ARIA SESSION SUMMARY
Course Elapsed Days: 8
Plan Fractions Treated to Date: 6
Plan Prescribed Dose Per Fraction: 2.67 Gy
Plan Total Fractions Prescribed: 15
Plan Total Prescribed Dose: 40.05 Gy
Reference Point Dosage Given to Date: 16.02 Gy
Reference Point Session Dosage Given: 2.67 Gy
Session Number: 6

## 2023-03-21 NOTE — Patient Instructions (Signed)
Medication Changes:  NO CHANGES    Lab Work:  NONE TODAY   Testing/Procedures:  Cardiac Sarcoidosis/Inflammation PET Scan  Food Diary Name: _____________________________ Please fill in EXACTLY what you have eaten and when for 24 hours PRIOR to your test date.  Time Food/Drink Comments  Breakfast                Lunch                Dinner                Snacks                 DO NOT EXERCISE THE DAY BEFORE YOUR TEST DO NOT EAT AFTER 5 PM THE DAY BEFORE YOUR TEST.  ON THE DAY OF YOUR TEST, DO NOT EAT ANY FOOD AND ONLY DRINK CLEAR WATER! PLEASE BRING THIS FOOD DIARY WITH YOU TO YOUR APPOINTMENT     Follow-Up in: 6 MONTHS WITH DR. Gasper Lloyd GIVE OUR OFFICE A CALL IN JUNE TO SCHEDULE   At [facility], you and your health needs are our priority. We have a designated team specialized in the treatment of Heart Failure. This Care Team includes your primary Heart Failure Specialized Cardiologist (physician), Advanced Practice Providers (APPs- Physician Assistants and Nurse Practitioners), and Pharmacist who all work together to provide you with the care you need, when you need it.   You may see any of the following providers on your designated Care Team at your next follow up:  Dr. Arvilla Meres Dr. Marca Ancona Dr. Dorthula Nettles Dr. Theresia Bough Tonye Becket, NP Robbie Lis, [place] [facility], [place] Brynda Peon, NP [place] Lee, NP Karle Plumber, PharmD   Please be sure to bring in all your medications bottles to every appointment.   Need to Contact us:  If you have any questions or concerns before your next appointment please send [name] a message through [place] or call our office at [phone].    TO LEAVE A MESSAGE FOR THE NURSE SELECT OPTION 2, PLEASE LEAVE A MESSAGE INCLUDING: YOUR NAME DATE OF BIRTH CALL BACK NUMBER REASON FOR CALL**this is important as we prioritize the call backs  YOU WILL RECEIVE A CALL  BACK THE SAME DAY AS LONG AS YOU CALL BEFORE 4:00 PM

## 2023-03-21 NOTE — Progress Notes (Signed)
[facility] Spiritual Care Note  Followed up by phone after Julie Cruz's period of extreme headache. She reports that she is feeling better today and expects today to be better emotionally than yesterday was, using the phone call as an opportunity to process some of her health worries and life stressors. Seeing how responsive her team has been with scheduling the Symptom Management and brain MRI visits has helped her feel supported and cared for.  Ettel plans to follow up by phone later in the week to update chaplain about and to process other appointments.   [address] [place], [place], [facility] Pager [phone] Voicemail [phone]

## 2023-03-21 NOTE — Progress Notes (Signed)
ADVANCED HEART FAILURE CLINIC NOTE  Referring Physician: Leilani Able, MD  Primary Care: Leilani Able, MD Primary Cardiologist: Dr. Gasper Lloyd  HPI: Julie Cruz is a 59 y.o. female with with pulmonary hypertension, hypertension, fibromyalgia and pulmonary and cardiac sarcoid presenting today to establish care.  Based on chart review it appears that she was initially diagnosed with sarcoid in 2007 at [facility] via endobronchial biopsy.  In September 2018 she had a cardiac MRI with normal LV/RV function but linear area of intramyocardial LGE at the septum.  She had a PET scan in 2020 that showed no cardiac uptake. Admitted in 2/24 for SOB w/ CTPE demonstrating B/L PE; noted to have LLE DVT. TTE at that [facility] with LVEF of 65%, moderate RV dysfunction. Cardiac PET in 4/24 with FDG uptake in the basal inferior/inferolateral walls consistent with LGE pattern on CMR.   Julie Cruz reports that she is very limited due to diffuse pain and fatigue.  She is intolerant to any steroids, reports that she has severe depression and that this depression is exacerbated by any steroid use.  Interval hx Since our last visit she has had a diagnostic mammogram with right breast DCIS. 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 She will require surgery and XRT. She reports that excision is pending surgical clearance from a heart standpoint. Otherwise, from a cardiac sarcoid standpoint, she continues to take MTX as prescribed by her previous rheumatologist. She has an upcoming appt with rheumatology here in [place]. 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   Past Medical History:  Diagnosis Date   Acute medial meniscus tear    Anxiety    Arthritis    Bipolar 1 disorder (HCC)    Bronchitis 12/08/2022   Cancer (HCC)    right breast ca   CHF (congestive heart failure) (HCC)    COPD (chronic obstructive pulmonary disease) (HCC)    Depression     Dyspnea    Fibromyalgia    H/O spinal cord injury    T, L ans CSpine   Hypertension    Menopause    Myocardial infarction (HCC)    mild per patient in ? 2014   Pulmonary hypertension (HCC)    Sarcoid, cardiac    Sleep apnea    uses BiPAP nightly    Current Outpatient Medications  Medication Sig Dispense Refill   acetaminophen (TYLENOL) 500 MG tablet Take 500-750 mg by mouth every 8 (eight) hours as needed for mild pain (pain score 1-3) or moderate pain (pain score 4-6).     apixaban (ELIQUIS) 5 MG TABS tablet Take 1 tablet (5 mg total) by mouth 2 (two) times daily. 180 tablet 3   Calcium-Magnesium-Zinc (CAL-MAG-ZINC PO) Take 1 tablet by mouth daily.     clonazePAM (KLONOPIN) 1 MG disintegrating tablet Take 1 mg by mouth 2 (two) times daily.     Cold Packs (ICE PACK) MISC 1 each by Other route daily. Heating pad and ice pack in towel     cyclobenzaprine (FLEXERIL) 10 MG tablet Take 20 mg by mouth daily as needed.     dorzolamide (TRUSOPT) 2 % ophthalmic solution Place 1 drop into the right eye 3 (three) times daily.     folic acid (FOLVITE) 1 MG tablet Take 1 mg by mouth daily.     hydrochlorothiazide (HYDRODIURIL) 50 MG tablet Take 50 mg by mouth in the morning.     ipratropium-albuterol (DUONEB) 0.5-2.5 (  3) MG/3ML SOLN Take 3 mLs by nebulization every 6 (six) hours as needed. (Patient taking differently: Take 3 mLs by nebulization every 6 (six) hours as needed (COPD).) 1080 mL 3   lisinopril (ZESTRIL) 40 MG tablet Take 1 tablet (40 mg total) by mouth in the morning. 90 tablet 3   LUMIGAN 0.01 % SOLN Place 1 drop into the left eye at bedtime.     Menthol, Topical Analgesic, (ICY HOT EX) Apply 1 application  topically daily as needed (pain).     Multiple Vitamins-Minerals (MULTIVITAMIN WITH MINERALS) tablet Take 1 tablet by mouth daily. No iron     oxyCODONE-acetaminophen (PERCOCET) 10-325 MG tablet Take 2 tablets by mouth 2 (two) times daily.     pantoprazole (PROTONIX) 40 MG tablet  Take 40 mg by mouth in the morning.     potassium chloride (KLOR-CON) 10 MEQ tablet Take 20 mEq by mouth in the morning. May take a third 10 meq tablet as needed for palpitations     Protein POWD Take 2 Scoops by mouth 3 (three) times a week.     sertraline (ZOLOFT) 100 MG tablet Take 200 mg by mouth in the morning.     traZODone (DESYREL) 50 MG tablet Take 50 mg by mouth at bedtime as needed for sleep.     verapamil (VERELAN) 360 MG 24 hr capsule Take 1 capsule (360 mg total) by mouth in the morning. (Patient taking differently: Take 360 mg by mouth in the morning. May take a second 360 mg as needed for palpitations) 90 capsule 3   methotrexate (RHEUMATREX) 2.5 MG tablet Take 12.5 mg by mouth See admin instructions. Take only on Tuesday's twice a day 12.5 in the morning and 12.5 mg in the evening (Patient not taking: Reported on 03/21/2023)     No current facility-administered medications for this encounter.    No Known Allergies    Social History   Socioeconomic History   Marital status: Single    Spouse name: Not on file   Number of children: Not on file   Years of education: Not on file   Highest education level: Not on file  Occupational History   Not on file  Tobacco Use   Smoking status: Former    Current packs/day: 0.00    Types: Cigarettes    Quit date: 2007    Years since quitting: 18.0   Smokeless tobacco: Never  Vaping Use   Vaping status: Never Used  Substance and Sexual Activity   Alcohol use: Yes    Alcohol/week: 2.0 standard drinks of alcohol    Types: 2 Glasses of wine per week    Comment: Drinks wine   Drug use: Never   Sexual activity: Not Currently    Birth control/protection: Post-menopausal, None  Other Topics Concern   Not on file  Social History Narrative   Right Handed    Lives in a one story home - [place]    Social Drivers of Health   Financial Resource Strain: Not on file  Food Insecurity: Food Insecurity Present (02/08/2023)   Hunger  Vital Sign    Worried About Running Out of Food in the Last Year: Sometimes true    Ran Out of Food in the Last Year: Never true  Transportation Needs: Unmet Transportation Needs (02/08/2023)   PRAPARE - Administrator, Civil Service (Medical): Yes    Lack of Transportation (Non-Medical): No  Physical Activity: Not on file  Stress: Not on  file  Social Connections: Unknown (12/27/2021)   Received from [facility], [facility]   Social Network    Social Network: Not on file  Intimate Partner Violence: Unknown (12/27/2021)   Received [facility], [facility]   HITS    Physically Hurt: Not on file    Insult or Talk Down To: Not on file    Threaten Physical Harm: Not on file    Scream or Curse: Not on file      Family History  Problem Relation Age of Onset   Hyperlipidemia Mother    Asthma Mother    Thyroid disease Mother    Depression Mother    Rheum arthritis Mother    Cancer Father        Pancreatic Cancer   Diabetes Father    Heart attack Brother    Depression Brother    HIV/AIDS Brother     PHYSICAL EXAM: Vitals:   03/21/23 1527  BP: 116/81  Pulse: 83  SpO2: 95%   GENERAL: Well nourished, well developed, and in no apparent distress at rest.  HEENT: Negative for arcus senilis or xanthelasma. There is no scleral icterus.  The mucous membranes are pink and moist.   NECK: Supple, No masses. Normal carotid upstrokes without bruits. No masses or thyromegaly.    CHEST: There are no chest wall deformities. There is no chest wall tenderness. Respirations are unlabored.  Lungs- CTA B/L CARDIAC:  JVP: 7 cm          Normal rate with regular rhythm. No murmurs, rubs or gallops.  Pulses are 2+ and symmetrical in upper and lower extremities. No edema.  ABDOMEN: Soft, non-tender, non-distended. There are no masses or hepatomegaly. There are normal bowel sounds.  EXTREMITIES: Warm and well perfused with no cyanosis, clubbing.  LYMPHATIC: No axillary or  supraclavicular lymphadenopathy.  NEUROLOGIC: Patient is oriented x3 with no focal or lateralizing neurologic deficits.  PSYCH: Patients affect is appropriate, there is no evidence of anxiety or depression.  SKIN: Warm and dry; no lesions or wounds.    DATA REVIEW  ECG: 09/01/22: normal sinus rhythm as per my personal interpretation  ECHO: 04/25/22: LVEF 65%-70%, moderately reduced RV function as per my personal interpretation  CATH:  06/26/22 1. Significant artifact throughout study due to implanted loop recorder  2. Small LV size, mild hypertrophy, and normal systolic function (EF 65%)  3.  Normal RV size and systolic function (EF 58%)  4. Basal septal midwall LGE, which is a scar pattern seen in nonischemic cardiomyopathies and associated with a worse prognosis  5. RV insertion site LGE, which is a nonspecific scar pattern often seen in setting of elevated pulmonary pressures   ASSESSMENT & PLAN:  Evaluation for cardiac sarcoid  - Diagnosed with sarcoid in 2007 at [facility] via endobronchial biopsy - Cardiac PET in 4/24 with FDG uptake in the basal inferior/inferolateral walls consistent with LGE pattern on CMR - Her case is fairly complex; she reports having some degree of uveitis from sarcoid in addition to diffuse arthralgias. She has mentioned switching to imuran, however, I do not believe treatment with imuran only will be sufficient for treatment of her diffuse sarcoid with cardiac involvement. Could consider transition to cellcept. Ultimately, she would benefit from dual therapy with methotrexate (agree with rheumatology to start methotrexate) or cellcept AND corticosteroids. Unfortunately she mentions becoming severely depressed with nightmares while on steroids. We had a lengthy discussion about this.  - Previously discussed started low dose  prednisone; she has not started it at this time. Will continue to hold until she sees rheumatology.  - Planning to see rheumatology  in 1 week. At this time, although, she has active inflammation in the myocardium by PET, her cardiac sarcoid is fairly indolent with no clinical manifestations. Will place ziopatch to assess for arrythmias. Due to the severity of her sarcoid (eye involvement) and her inability to tolerate steroids, I will defer management to rheumatology. May require TNF-alpha inhibitor?   2. Pulmonary hypertension  - Likely due to a combination of HFpEF/OHS/sarcoid  - Will plan on RHC in the future. Averse to procedures.   3. OSA - Bipap + O2 at night.   4. 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  - From a cardiac standpoint, she is low risk to proceed with excision.    Alanson Hausmann Advanced Heart Failure Mechanical Circulatory Support

## 2023-03-21 NOTE — Assessment & Plan Note (Signed)
58 year old woman with stage IA ER/PR positive breast cancer diagnosed in 02/2023 s/p lumpectomy followed by adjuvant radiation which she is currently undergoing.    New Onset Headache Severe headache in a patient with a history of breast cancer and sarcoidosis. No prior history of migraines. Associated with vision changes and balance issues. -Order MRI to rule out metastasis or other intracranial pathology. -Administer Toradol injection post-radiation today for symptomatic relief. -Advise patient to increase fluid intake.  Sarcoidosis with Ocular Involvement Patient reports vision changes and has a known history of sarcoidosis affecting the eyes. -Urgent referral to sarcoidosis specialists (Dr. Clelia Croft and Dr. Edrick Oh) for evaluation this week.  Breast Cancer Patient is currently undergoing radiation therapy. Reports burning sensation in the breast receiving treatment. -Continue with scheduled radiation therapy today.  Balance Issues New onset balance issues reported. -This issue will be further evaluated with the ordered MRI. -Consider PT if persistent   General Health Maintenance -Ensure adequate hydration.

## 2023-03-22 ENCOUNTER — Ambulatory Visit (HOSPITAL_COMMUNITY)
Admission: RE | Admit: 2023-03-22 | Discharge: 2023-03-22 | Disposition: A | Discharge status: Home / Self Care | Payer: Medicare HMO | Source: Ambulatory Visit | Attending: Adult Health | Admitting: Adult Health

## 2023-03-22 ENCOUNTER — Other Ambulatory Visit: Payer: Self-pay

## 2023-03-22 ENCOUNTER — Ambulatory Visit
Admission: RE | Admit: 2023-03-22 | Discharge: 2023-03-22 | Disposition: A | Discharge status: Home / Self Care | Payer: Medicare HMO | Source: Ambulatory Visit | Attending: Radiation Oncology | Admitting: Radiation Oncology

## 2023-03-22 ENCOUNTER — Inpatient Hospital Stay: Payer: Medicare HMO

## 2023-03-22 ENCOUNTER — Telehealth: Payer: Self-pay

## 2023-03-22 ENCOUNTER — Encounter (HOSPITAL_COMMUNITY): Payer: Self-pay | Admitting: Cardiology

## 2023-03-22 DIAGNOSIS — C50511 Malignant neoplasm of lower-outer quadrant of right female breast: Secondary | ICD-10-CM | POA: Insufficient documentation

## 2023-03-22 DIAGNOSIS — G4452 New daily persistent headache (NDPH): Secondary | ICD-10-CM | POA: Insufficient documentation

## 2023-03-22 DIAGNOSIS — Z170 Estrogen receptor positive status [ER+]: Secondary | ICD-10-CM | POA: Diagnosis present

## 2023-03-22 LAB — RAD ONC ARIA SESSION SUMMARY
Course Elapsed Days: 9
Plan Fractions Treated to Date: 7
Plan Prescribed Dose Per Fraction: 2.67 Gy
Plan Total Fractions Prescribed: 15
Plan Total Prescribed Dose: 40.05 Gy
Reference Point Dosage Given to Date: 18.69 Gy
Reference Point Session Dosage Given: 2.67 Gy
Session Number: 7

## 2023-03-22 MED ORDER — GADOBUTROL 1 MMOL/ML IV SOLN
10.0000 mL | Freq: Once | INTRAVENOUS | Status: AC | PRN
  Administered 2023-03-22: 10 mL via INTRAVENOUS

## 2023-03-22 NOTE — Telephone Encounter (Addendum)
Called pt. Per Np message below. Pt states that she is doing well on the Toradol. Pt verbalized understanding.----- Message from Noreene Filbert sent at 03/22/2023 10:05 AM EST ----- MRI negative for malignancy and doesn't ID a cause for her migraine.  Please let her know and ask her if she has had the opportunity to f/u with her sarcoid specialist.  Also she how she is doing after the toradol.  Thanks, LC ----- Message ----- From: Interface, Rad Results In Sent: 03/22/2023   9:12 AM EST To: Loa Socks, NP

## 2023-03-23 ENCOUNTER — Other Ambulatory Visit: Payer: Self-pay

## 2023-03-23 ENCOUNTER — Inpatient Hospital Stay: Payer: Medicare HMO

## 2023-03-23 ENCOUNTER — Other Ambulatory Visit: Payer: Self-pay | Admitting: *Deleted

## 2023-03-23 ENCOUNTER — Ambulatory Visit
Admission: RE | Admit: 2023-03-23 | Discharge: 2023-03-23 | Disposition: A | Discharge status: Home / Self Care | Payer: Medicare HMO | Source: Ambulatory Visit | Attending: Radiation Oncology | Admitting: Radiation Oncology

## 2023-03-23 DIAGNOSIS — C50511 Malignant neoplasm of lower-outer quadrant of right female breast: Secondary | ICD-10-CM | POA: Diagnosis not present

## 2023-03-23 DIAGNOSIS — G4452 New daily persistent headache (NDPH): Secondary | ICD-10-CM

## 2023-03-23 LAB — RAD ONC ARIA SESSION SUMMARY
Course Elapsed Days: 10
Plan Fractions Treated to Date: 8
Plan Prescribed Dose Per Fraction: 2.67 Gy
Plan Total Fractions Prescribed: 15
Plan Total Prescribed Dose: 40.05 Gy
Reference Point Dosage Given to Date: 21.36 Gy
Reference Point Session Dosage Given: 2.67 Gy
Session Number: 8

## 2023-03-23 MED ORDER — KETOROLAC TROMETHAMINE 30 MG/ML IJ SOLN
60.0000 mg | Freq: Once | INTRAMUSCULAR | Status: AC
Start: 2023-03-23 — End: 2023-03-23
  Administered 2023-03-23: 60 mg via INTRAMUSCULAR
  Filled 2023-03-23: qty 2

## 2023-03-23 NOTE — Progress Notes (Signed)
Received call from pt with complaint of ongoing headache.  RN reviewed with Lillard Anes NP and verbal orders received for pt to receive 60 mg IM Toradol x1 and to f/u with eye specialist Dr. Sherryll Burger.  Orders placed, pt educated and verbalized understanding that our office will no longer be able to administer Toradol after this appt.

## 2023-03-26 ENCOUNTER — Other Ambulatory Visit: Payer: Self-pay

## 2023-03-26 ENCOUNTER — Ambulatory Visit: Payer: Medicare HMO

## 2023-03-26 ENCOUNTER — Ambulatory Visit
Admission: RE | Admit: 2023-03-26 | Discharge: 2023-03-26 | Disposition: A | Discharge status: Home / Self Care | Payer: Medicare HMO | Source: Ambulatory Visit | Attending: Radiation Oncology | Admitting: Radiation Oncology

## 2023-03-26 ENCOUNTER — Inpatient Hospital Stay: Payer: Medicare HMO

## 2023-03-26 ENCOUNTER — Encounter: Payer: Self-pay | Admitting: Radiation Oncology

## 2023-03-26 DIAGNOSIS — C50511 Malignant neoplasm of lower-outer quadrant of right female breast: Secondary | ICD-10-CM | POA: Diagnosis not present

## 2023-03-26 LAB — RAD ONC ARIA SESSION SUMMARY
Course Elapsed Days: 13
Plan Fractions Treated to Date: 9
Plan Prescribed Dose Per Fraction: 2.67 Gy
Plan Total Fractions Prescribed: 15
Plan Total Prescribed Dose: 40.05 Gy
Reference Point Dosage Given to Date: 24.03 Gy
Reference Point Session Dosage Given: 2.67 Gy
Session Number: 9

## 2023-03-26 NOTE — Progress Notes (Signed)
Called patient whom left voicemail regarding [employer] grant details.  Patient states she reviewed the expense sheet and we went over it in detail. Advised what is needed for expense she would like to submit and the processing time. She verbalized understanding.  She has my card for any additional financial questions or concerns.

## 2023-03-27 ENCOUNTER — Ambulatory Visit
Admission: RE | Admit: 2023-03-27 | Discharge: 2023-03-27 | Disposition: A | Discharge status: Home / Self Care | Payer: Medicare HMO | Source: Ambulatory Visit | Attending: Radiation Oncology | Admitting: Radiation Oncology

## 2023-03-27 ENCOUNTER — Inpatient Hospital Stay: Payer: Medicare HMO

## 2023-03-27 ENCOUNTER — Other Ambulatory Visit: Payer: Self-pay

## 2023-03-27 DIAGNOSIS — C50511 Malignant neoplasm of lower-outer quadrant of right female breast: Secondary | ICD-10-CM | POA: Diagnosis not present

## 2023-03-27 LAB — RAD ONC ARIA SESSION SUMMARY
Course Elapsed Days: 14
Plan Fractions Treated to Date: 10
Plan Prescribed Dose Per Fraction: 2.67 Gy
Plan Total Fractions Prescribed: 15
Plan Total Prescribed Dose: 40.05 Gy
Reference Point Dosage Given to Date: 26.7 Gy
Reference Point Session Dosage Given: 2.67 Gy
Session Number: 10

## 2023-03-28 ENCOUNTER — Other Ambulatory Visit: Payer: Self-pay

## 2023-03-28 ENCOUNTER — Ambulatory Visit
Admission: RE | Admit: 2023-03-28 | Discharge: 2023-03-28 | Disposition: A | Discharge status: Home / Self Care | Payer: Medicare HMO | Source: Ambulatory Visit | Attending: Radiation Oncology | Admitting: Radiation Oncology

## 2023-03-28 ENCOUNTER — Telehealth: Payer: Self-pay | Admitting: Radiation Oncology

## 2023-03-28 ENCOUNTER — Inpatient Hospital Stay: Payer: Medicare HMO

## 2023-03-28 DIAGNOSIS — C50511 Malignant neoplasm of lower-outer quadrant of right female breast: Secondary | ICD-10-CM | POA: Diagnosis not present

## 2023-03-28 LAB — RAD ONC ARIA SESSION SUMMARY
Course Elapsed Days: 15
Plan Fractions Treated to Date: 11
Plan Prescribed Dose Per Fraction: 2.67 Gy
Plan Total Fractions Prescribed: 15
Plan Total Prescribed Dose: 40.05 Gy
Reference Point Dosage Given to Date: 29.37 Gy
Reference Point Session Dosage Given: 1.57 Gy
Session Number: 11

## 2023-03-28 NOTE — Telephone Encounter (Signed)
1/29 @ 8:23 am Patient called due to Zenaida Niece has not pick her up yet for her treatment appointment for today.  Called and forwarded call to front desk for [facility] or Kathlene November due to Ephriam Knuckles is off today.  Also sent email to Support RTT and copied L4 machine, so they are aware.

## 2023-03-29 ENCOUNTER — Other Ambulatory Visit: Payer: Self-pay

## 2023-03-29 ENCOUNTER — Ambulatory Visit
Admission: RE | Admit: 2023-03-29 | Discharge: 2023-03-29 | Disposition: A | Discharge status: Home / Self Care | Payer: Medicare HMO | Source: Ambulatory Visit | Attending: Radiation Oncology | Admitting: Radiation Oncology

## 2023-03-29 ENCOUNTER — Ambulatory Visit (HOSPITAL_COMMUNITY): Payer: Medicare HMO

## 2023-03-29 ENCOUNTER — Inpatient Hospital Stay: Payer: Medicare HMO

## 2023-03-29 DIAGNOSIS — C50511 Malignant neoplasm of lower-outer quadrant of right female breast: Secondary | ICD-10-CM | POA: Diagnosis not present

## 2023-03-29 LAB — RAD ONC ARIA SESSION SUMMARY
Course Elapsed Days: 16
Plan Fractions Treated to Date: 12
Plan Prescribed Dose Per Fraction: 2.67 Gy
Plan Total Fractions Prescribed: 15
Plan Total Prescribed Dose: 40.05 Gy
Reference Point Dosage Given to Date: 32.04 Gy
Reference Point Session Dosage Given: 2.67 Gy
Session Number: 12

## 2023-03-30 ENCOUNTER — Inpatient Hospital Stay: Payer: Medicare HMO

## 2023-03-30 ENCOUNTER — Ambulatory Visit
Admission: RE | Admit: 2023-03-30 | Discharge: 2023-03-30 | Disposition: A | Discharge status: Home / Self Care | Payer: Medicare HMO | Source: Ambulatory Visit | Attending: Radiation Oncology

## 2023-03-30 ENCOUNTER — Other Ambulatory Visit: Payer: Self-pay

## 2023-03-30 DIAGNOSIS — C50511 Malignant neoplasm of lower-outer quadrant of right female breast: Secondary | ICD-10-CM | POA: Diagnosis not present

## 2023-03-30 LAB — RAD ONC ARIA SESSION SUMMARY
Course Elapsed Days: 17
Plan Fractions Treated to Date: 13
Plan Prescribed Dose Per Fraction: 2.67 Gy
Plan Total Fractions Prescribed: 15
Plan Total Prescribed Dose: 40.05 Gy
Reference Point Dosage Given to Date: 34.71 Gy
Reference Point Session Dosage Given: 2.67 Gy
Session Number: 13

## 2023-04-02 ENCOUNTER — Telehealth: Payer: Self-pay | Admitting: Radiation Oncology

## 2023-04-02 ENCOUNTER — Ambulatory Visit: Payer: Medicare HMO

## 2023-04-02 ENCOUNTER — Ambulatory Visit
Admission: RE | Admit: 2023-04-02 | Discharge: 2023-04-02 | Disposition: A | Discharge status: Home / Self Care | Payer: Medicare HMO | Source: Ambulatory Visit | Attending: Radiation Oncology

## 2023-04-02 ENCOUNTER — Encounter: Payer: Self-pay | Admitting: General Practice

## 2023-04-02 ENCOUNTER — Other Ambulatory Visit: Payer: Self-pay

## 2023-04-02 ENCOUNTER — Ambulatory Visit
Admission: RE | Admit: 2023-04-02 | Discharge: 2023-04-02 | Disposition: A | Discharge status: Home / Self Care | Payer: Medicare HMO | Source: Ambulatory Visit | Attending: Radiation Oncology | Admitting: Radiation Oncology

## 2023-04-02 DIAGNOSIS — I5022 Chronic systolic (congestive) heart failure: Secondary | ICD-10-CM | POA: Diagnosis present

## 2023-04-02 DIAGNOSIS — D869 Sarcoidosis, unspecified: Secondary | ICD-10-CM | POA: Diagnosis present

## 2023-04-02 DIAGNOSIS — Z170 Estrogen receptor positive status [ER+]: Secondary | ICD-10-CM | POA: Diagnosis present

## 2023-04-02 DIAGNOSIS — C50919 Malignant neoplasm of unspecified site of unspecified female breast: Secondary | ICD-10-CM | POA: Diagnosis present

## 2023-04-02 DIAGNOSIS — D8685 Sarcoid myocarditis: Secondary | ICD-10-CM | POA: Diagnosis present

## 2023-04-02 DIAGNOSIS — H209 Unspecified iridocyclitis: Secondary | ICD-10-CM | POA: Insufficient documentation

## 2023-04-02 DIAGNOSIS — C50511 Malignant neoplasm of lower-outer quadrant of right female breast: Secondary | ICD-10-CM | POA: Diagnosis present

## 2023-04-02 LAB — RAD ONC ARIA SESSION SUMMARY
Course Elapsed Days: 20
Plan Fractions Treated to Date: 14
Plan Prescribed Dose Per Fraction: 2.67 Gy
Plan Total Fractions Prescribed: 15
Plan Total Prescribed Dose: 40.05 Gy
Reference Point Dosage Given to Date: 37.38 Gy
Reference Point Session Dosage Given: 2.67 Gy
Session Number: 14

## 2023-04-02 NOTE — Progress Notes (Signed)
[facility] Spiritual Care Note  Returned call from [place], scheduling visit in Spiritual Care office for Wednesday at 1:00 pm, preceding her 2:30 appointment with Dr Pamelia Hoit. She is eager to share and process updates.   [address] [place], [place], [place] [facility] Pager [phone] Voicemail [phone]

## 2023-04-02 NOTE — Telephone Encounter (Signed)
Pt called stating she's been trying to set up transportation all morning for today's tx. I call coordinator Altamease Oiler who advised pt NEEDS to call [facility] the week before to set up transportation with enough notice. She was able to send transportation and advised pt should see notification shortly. She asked to remind pt of how soon to call [facility]. I advised pt of all this and that she should see notification of transportation soon on her phone. Pt confirmed notification received. L4 tx team advised of pt's delay in arrival.

## 2023-04-02 NOTE — Telephone Encounter (Signed)
2/3 @ 9:30 am called due to transportation no show to bring her to her treatment appointment for today at 8:45 am.  She is scheduled on L4 machine.  Called and spoke to [place].  Patient was put in for today and tomorrow. And transportation was sent to her resident.  Email forward to Support RTT and L4 machine so they are aware.

## 2023-04-03 ENCOUNTER — Ambulatory Visit
Admission: RE | Admit: 2023-04-03 | Discharge: 2023-04-03 | Disposition: A | Discharge status: Home / Self Care | Payer: Medicare HMO | Source: Ambulatory Visit | Attending: Radiation Oncology

## 2023-04-03 ENCOUNTER — Other Ambulatory Visit: Payer: Self-pay

## 2023-04-03 ENCOUNTER — Ambulatory Visit: Payer: Medicare HMO

## 2023-04-03 DIAGNOSIS — C50511 Malignant neoplasm of lower-outer quadrant of right female breast: Secondary | ICD-10-CM | POA: Diagnosis not present

## 2023-04-03 LAB — RAD ONC ARIA SESSION SUMMARY
Course Elapsed Days: 21
Plan Fractions Treated to Date: 15
Plan Prescribed Dose Per Fraction: 2.67 Gy
Plan Total Fractions Prescribed: 15
Plan Total Prescribed Dose: 40.05 Gy
Reference Point Dosage Given to Date: 40.05 Gy
Reference Point Session Dosage Given: 2.67 Gy
Session Number: 15

## 2023-04-04 ENCOUNTER — Encounter: Payer: Self-pay | Admitting: General Practice

## 2023-04-04 ENCOUNTER — Inpatient Hospital Stay: Payer: Medicare HMO

## 2023-04-04 ENCOUNTER — Inpatient Hospital Stay: Payer: Medicare HMO | Attending: Hematology and Oncology | Admitting: Genetic Counselor

## 2023-04-04 ENCOUNTER — Encounter: Payer: Self-pay | Admitting: Genetic Counselor

## 2023-04-04 ENCOUNTER — Inpatient Hospital Stay (HOSPITAL_BASED_OUTPATIENT_CLINIC_OR_DEPARTMENT_OTHER): Payer: Medicare HMO | Admitting: Hematology and Oncology

## 2023-04-04 VITALS — BP 129/76 | HR 89 | Temp 97.6°F | Resp 18 | Ht 71.0 in | Wt 318.0 lb

## 2023-04-04 DIAGNOSIS — Z170 Estrogen receptor positive status [ER+]: Secondary | ICD-10-CM | POA: Insufficient documentation

## 2023-04-04 DIAGNOSIS — Z86718 Personal history of other venous thrombosis and embolism: Secondary | ICD-10-CM | POA: Insufficient documentation

## 2023-04-04 DIAGNOSIS — M549 Dorsalgia, unspecified: Secondary | ICD-10-CM | POA: Diagnosis not present

## 2023-04-04 DIAGNOSIS — Z1379 Encounter for other screening for genetic and chromosomal anomalies: Secondary | ICD-10-CM | POA: Insufficient documentation

## 2023-04-04 DIAGNOSIS — D860 Sarcoidosis of lung: Secondary | ICD-10-CM | POA: Insufficient documentation

## 2023-04-04 DIAGNOSIS — G8929 Other chronic pain: Secondary | ICD-10-CM | POA: Insufficient documentation

## 2023-04-04 DIAGNOSIS — M79603 Pain in arm, unspecified: Secondary | ICD-10-CM | POA: Insufficient documentation

## 2023-04-04 DIAGNOSIS — Z800 Family history of malignant neoplasm of digestive organs: Secondary | ICD-10-CM

## 2023-04-04 DIAGNOSIS — Z803 Family history of malignant neoplasm of breast: Secondary | ICD-10-CM

## 2023-04-04 DIAGNOSIS — C50511 Malignant neoplasm of lower-outer quadrant of right female breast: Secondary | ICD-10-CM | POA: Diagnosis present

## 2023-04-04 NOTE — Assessment & Plan Note (Addendum)
 Systemic sarcoidosis (lung, heart) Diffuse body pains, chronic fatigue, severe hot flashes, endometrial hypertrophy, history of DVT, pulmonary hypertension   August 2024: 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  Surveillance plan: Apart from annual mammograms we will do guardant reveal for MRD testing. Return to clinic in 3 months for survivorship care plan visit

## 2023-04-04 NOTE — Progress Notes (Signed)
 Patient Care Team: Ilah Crigler, MD as PCP - General (Family Medicine) Kate Lonni CROME, MD as PCP - Cardiology (Cardiology) Tyree Nanetta SAILOR, RN as Oncology Nurse Navigator Glean Stephane BROCKS, RN as Oncology Nurse Navigator Odean Potts, MD as Consulting Physician (Hematology and Oncology) Izell Domino, MD as Attending Physician (Radiation Oncology) Aron Shoulders, MD as Consulting Physician (General Surgery) Maree Paticia BRAVO, MD as Referring Physician (Ophthalmology)  DIAGNOSIS:  Encounter Diagnosis  Name Primary?   Malignant neoplasm of lower-outer quadrant of right breast of female, estrogen receptor positive (HCC) Yes    SUMMARY OF ONCOLOGIC HISTORY: Oncology History  Malignant neoplasm of lower-outer quadrant of right breast of female, estrogen receptor positive (HCC)  09/20/2022 Genetic Testing   Negative genetic testing on the CancerNext-Expanded+RNAinsight. CDKN2A VUS identified. Testing was ordered in July 2024 through her OB/GYN office.  The CancerNext-Expanded gene panel offered by [facility] and includes sequencing, rearrangement, and RNA analysis for the following 76 genes: AIP, ALK, APC, ATM, AXIN2, BAP1, BARD1, BMPR1A, BRCA1, BRCA2, BRIP1, CDC73, CDH1, CDK4, CDKN1B, CDKN2A, CEBPA, CHEK2, CTNNA1, DDX41, DICER1, ETV6, FH, FLCN, GATA2, LZTR1, MAX, MBD4, MEN1, MET, MLH1, MSH2, MSH3, MSH6, MUTYH, NF1, NF2, NTHL1, PALB2, PHOX2B, PMS2, POT1, PRKAR1A, PTCH1, PTEN, RAD51C, RAD51D, RB1, RET, RUNX1, SDHA, SDHAF2, SDHB, SDHC, SDHD, SMAD4, SMARCA4, SMARCB1, SMARCE1, STK11, SUFU, TMEM127, TP53, TSC1, TSC2, VHL, and WT1 (sequencing and deletion/duplication); EGFR, HOXB13, KIT, MITF, PDGFRA, POLD1, and POLE (sequencing only); EPCAM and GREM1 (deletion/duplication only).     12/28/2022 Surgery   Right breast lumpectomy: Invasive ductal carcinoma, 1.5 mm, lateral margin positive, ER positive, PR positive, Ki67 2%, HER2- (0+).   01/17/2023 Surgery   Re-excision cleared margins    03/08/2023 Initial Diagnosis   Malignant neoplasm of lower-outer quadrant of right breast of female, estrogen receptor positive (HCC)   03/08/2023 Cancer Staging   Staging form: Breast, AJCC 8th Edition - Pathologic stage from 03/08/2023: Stage IA (pT1a, pN0, cM0, G1, ER+, PR+, HER2-) - Signed by Odean Potts, MD on 04/04/2023 Stage prefix: Initial diagnosis Method of lymph node assessment: Clinical Multigene prognostic tests performed: None Histologic grading system: 3 grade system   03/13/2023 - 04/03/2023 Radiation Therapy   Adjuvant radiation therapy     CHIEF COMPLIANT: Follow-up after radiation therapy  HISTORY OF PRESENT ILLNESS:  History of Present Illness   Julie Cruz is a 58 year old female with breast cancer and sarcoidosis who presents for follow-up after radiation therapy.  She experiences significant physical pain, particularly in the spinal cord area, which was exacerbated during radiation treatment. ++ The pain has been ongoing for over 15 years and is worsening, affecting her cognitive function and daily activities.  She has chronic pain, particularly in her arm, described as hot and inflamed. She manages her pain with home remedies, such as applying pressure to her spine using her daughter and granddaughter, and rolling on an ice bottle. She wants to resume physical activities like yoga and Tai Chi to improve her condition.  She has a history of sarcoidosis, diagnosed at age 71, which initially presented with findings on her lungs. She experiences frequent coughing and uses an inhaler to manage symptoms. She also reports a history of blood clots, bronchitis, and two comas around her birthday last year. Her pulmonologist observed some lung restrictions.  She experienced unexpected menstrual bleeding five years after her cycles stopped, which she initially dismissed based on cultural beliefs. She is concerned about potential cancer recurrence and inquires about  surveillance options.  She has experienced significant weight loss since starting her weight loss journey. She mentions a previous weight loss of 90 pounds at age 89, which was followed by a diagnosis of sarcoidosis and subsequent steroid treatment, leading to weight gain. She attributes some of her weight gain to depression and a sedentary lifestyle, including alcohol consumption, which she has since reduced. She is currently focused on maintaining a healthy diet, including protein and fruits, and continues her weight loss journey.         ALLERGIES:  has no known allergies.  MEDICATIONS:  Current Outpatient Medications  Medication Sig Dispense Refill   acetaminophen  (TYLENOL ) 500 MG tablet Take 500-750 mg by mouth every 8 (eight) hours as needed for mild pain (pain score 1-3) or moderate pain (pain score 4-6).     apixaban  (ELIQUIS ) 5 MG TABS tablet Take 1 tablet (5 mg total) by mouth 2 (two) times daily. 180 tablet 3   Calcium-Magnesium-Zinc (CAL-MAG-ZINC PO) Take 1 tablet by mouth daily.     clonazePAM  (KLONOPIN ) 1 MG disintegrating tablet Take 1 mg by mouth 2 (two) times daily.     Cold Packs (ICE PACK) MISC 1 each by Other route daily. Heating pad and ice pack in towel     cyclobenzaprine  (FLEXERIL ) 10 MG tablet Take 20 mg by mouth daily as needed.     dorzolamide  (TRUSOPT ) 2 % ophthalmic solution Place 1 drop into the right eye 3 (three) times daily.     folic acid (FOLVITE) 1 MG tablet Take 1 mg by mouth daily.     hydrochlorothiazide (HYDRODIURIL) 50 MG tablet Take 50 mg by mouth in the morning.     ipratropium-albuterol  (DUONEB) 0.5-2.5 (3) MG/3ML SOLN Take 3 mLs by nebulization every 6 (six) hours as needed. (Patient taking differently: Take 3 mLs by nebulization every 6 (six) hours as needed (COPD).) 1080 mL 3   lisinopril  (ZESTRIL ) 40 MG tablet Take 1 tablet (40 mg total) by mouth in the morning. 90 tablet 3   LUMIGAN 0.01 % SOLN Place 1 drop into the left eye at bedtime.      Menthol, Topical Analgesic, (ICY HOT EX) Apply 1 application  topically daily as needed (pain).     methotrexate  (RHEUMATREX) 2.5 MG tablet Take 12.5 mg by mouth See admin instructions. Take only on Tuesday's twice a day 12.5 in the morning and 12.5 mg in the evening (Patient not taking: Reported on 03/21/2023)     Multiple Vitamins-Minerals (MULTIVITAMIN WITH MINERALS) tablet Take 1 tablet by mouth daily. No iron     oxyCODONE -acetaminophen  (PERCOCET) 10-325 MG tablet Take 2 tablets by mouth 2 (two) times daily.     pantoprazole  (PROTONIX ) 40 MG tablet Take 40 mg by mouth in the morning.     potassium chloride  (KLOR-CON ) 10 MEQ tablet Take 20 mEq by mouth in the morning. May take a third 10 meq tablet as needed for palpitations     Protein POWD Take 2 Scoops by mouth 3 (three) times a week.     sertraline  (ZOLOFT ) 100 MG tablet Take 200 mg by mouth in the morning.     traZODone  (DESYREL ) 50 MG tablet Take 50 mg by mouth at bedtime as needed for sleep.     verapamil  (VERELAN ) 360 MG 24 hr capsule Take 1 capsule (360 mg total) by mouth in the morning. (Patient taking differently: Take 360 mg by mouth in the morning. May take a second 360 mg as needed for palpitations) 90 capsule 3  No current facility-administered medications for this visit.    PHYSICAL EXAMINATION: ECOG PERFORMANCE STATUS: 1 - Symptomatic but completely ambulatory  Vitals:   04/04/23 1219  BP: 129/76  Pulse: 89  Resp: 18  Temp: 97.6 F (36.4 C)  SpO2: 98%   Filed Weights   04/04/23 1219  Weight: (!) 318 lb (144.2 kg)      LABORATORY DATA:  I have reviewed the data as listed    Latest Ref Rng & Units 01/17/2023   10:32 AM 12/08/2022    1:39 AM 11/08/2022    4:22 PM  CMP  Glucose 70 - 99 mg/dL 94  892  95   BUN 6 - 20 mg/dL 8  11  12    Creatinine 0.44 - 1.00 mg/dL 8.91  9.04  8.94   Sodium 135 - 145 mmol/L 139  140  139   Potassium 3.5 - 5.1 mmol/L 3.4  3.6  3.5   Chloride 98 - 111 mmol/L 100  97  101   CO2  22 - 32 mmol/L 27  26  31    Calcium 8.9 - 10.3 mg/dL 9.1  9.4  9.4   Total Protein 6.5 - 8.1 g/dL  6.9  7.5   Total Bilirubin 0.3 - 1.2 mg/dL  0.4  0.5   Alkaline Phos 38 - 126 U/L  53  51   AST 15 - 41 U/L  30  26   ALT 0 - 44 U/L  25  24     Lab Results  Component Value Date   WBC 4.5 01/17/2023   HGB 11.9 (L) 01/17/2023   HCT 36.3 01/17/2023   MCV 99.2 01/17/2023   PLT 173 01/17/2023   NEUTROABS 3.3 11/08/2022    ASSESSMENT & PLAN:  Malignant neoplasm of lower-outer quadrant of right breast of female, 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, heart) Diffuse body pains, chronic fatigue, severe hot flashes, endometrial hypertrophy, history of DVT, pulmonary hypertension   August 2024: 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  Surveillance plan: Apart from annual mammograms we will do guardant reveal for MRD testing. Return to clinic in 3 months for survivorship care plan visit  ------------------------------------- Assessment and Plan    Breast Cancer (Post-Treatment) Post-radiation therapy for breast cancer. Unable to take tamoxifen or undergo hysterectomy/mastectomy. Low recurrence risk (6% over ten years). Focus on recovery and monitoring. Emphasized diet, exercise, and stress reduction in survivorship care. - Schedule annual mammograms - Follow-up with nurse practitioner Morna in three months for survivorship care - Order Gardant Reveal blood test every six months for cancer  surveillance  Chronic Pain Chronic spinal and arm pain for 15-18 years, recently worsened, affecting cognitive function and daily activities. Previous treatments with Toradol  and tramadol were ineffective. Considering resuming methotrexate , which may have exacerbated sarcoidosis-related pain. - Refer to physical therapy for dry needling - Continue pain management and spinal institute appointments - Consider resuming methotrexate  under supervision  Sarcoidosis Diagnosed in 2007 with ongoing symptoms including coughing and lung restrictions. Uses an inhaler for  symptom management. Pulmonologist noted lung restrictions. - Continue current inhaler use - Monitor lung function and symptoms  General Health Maintenance Focus on diet, exercise, and stress reduction. Weight loss goal of 40 pounds. Emphasis on bone, skin health, and overall wellness. Taking Nature Made supplements (calcium, zinc, magnesium, and D3). - Continue current diet with emphasis on protein, fruits, and berries - Engage in physical activities like yoga and Tai Chi - Continue taking Nature Made supplements (calcium, zinc, magnesium, and D3)  Follow-up - Follow-up with nurse practitioner Morna in three months - Gardant Reveal blood test every six months - Continue pain management and physical therapy appointments.          No orders of the defined types were placed in this encounter.  The patient has a good understanding of the overall plan. she agrees with it. she will call with any problems that may develop before the next visit here. Total time spent: 30 mins including face to face time and time spent for planning, charting and co-ordination of care   Viinay K Kaitlen Redford, MD 04/04/23

## 2023-04-04 NOTE — Progress Notes (Signed)
 [facility] Spiritual Care Note  Visited with Julie Cruz in Spiritual Care office as scheduled. She used the opportunity to acknowledge how much she still needs/wants to process about her health situation and found it helpfully normalizing to learn that many patients discover that they have a lot to process after treatment because of how occupied they are during treatment itself. 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 Julie Cruz explored goals and hopes related to healing, improved energy, and connecting/meaning-making activities. 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  Provided compassionate presence, reflective listening, normalization of feelings, and emotional support. Julie Cruz plans to phone with updates and to request follow-up support.   [address] [name], [place], [facility] Pager [phone] Voicemail [phone]

## 2023-04-04 NOTE — Progress Notes (Signed)
 REFERRING PROVIDER: Aron Shoulders, MD [address] [place],  [place] [address]  PRIMARY PROVIDER:  Ilah Crigler, MD  PRIMARY REASON FOR VISIT:  1. Family history of breast cancer   2. Family history of pancreatic cancer   3. Malignant neoplasm of lower-outer quadrant of right breast of female, estrogen receptor positive (HCC)      HISTORY OF PRESENT ILLNESS:   Julie Cruz. Dwyer, a 57 y.o. female, was seen for a [place] cancer genetics consultation at the request of Dr. Aron due to a personal and family history of cancer.  Julie Cruz. Mckinlay presents to clinic today to discuss the possibility of a hereditary predisposition to cancer, genetic testing, and to further clarify her future cancer risks, as well as potential cancer risks for family members.   In October 2024, at the age of 60, Julie Cruz. Schuff was diagnosed with Stage 1A cancer of the right breast. The treatment plan included lumpectomy and radiation. She underwent genetic testing at her OB/GYN office.   CANCER HISTORY:  Oncology History  Malignant neoplasm of lower-outer quadrant of right breast of female, estrogen receptor positive (HCC)  12/28/2022 Surgery   Right breast lumpectomy: Invasive ductal carcinoma, 1.5 mm, lateral margin positive, ER positive, PR positive, Ki67 2%, HER2- (0+).   01/17/2023 Surgery   Re-excision cleared margins   03/08/2023 Initial Diagnosis   Malignant neoplasm of lower-outer quadrant of right breast of female, estrogen receptor positive (HCC)   03/13/2023 - 04/03/2023 Radiation Therapy   Adjuvant radiation therapy      RISK FACTORS:  Menarche was at age 17.  First live birth at age 35.  Ovaries intact: yes.  Hysterectomy: no.  Menopausal status: postmenopausal.  HRT use: 0 years. Colonoscopy: yes; normal. Mammogram within the last year: yes. Number of breast biopsies: 1. Up to date with pelvic exams: yes. Any excessive radiation exposure in the past: no  Past Medical History:   Diagnosis Date   Acute medial meniscus tear    Anxiety    Arthritis    Bipolar 1 disorder (HCC)    Bronchitis 12/08/2022   Cancer (HCC)    right breast ca   CHF (congestive heart failure) (HCC)    COPD (chronic obstructive pulmonary disease) (HCC)    Depression    Dyspnea    Family history of breast cancer    Family history of pancreatic cancer    Fibromyalgia    H/O spinal cord injury    T, L ans CSpine   Hypertension    Menopause    Myocardial infarction (HCC)    mild per patient in ? 2014   Pulmonary hypertension (HCC)    Sarcoid, cardiac    Sleep apnea    uses BiPAP nightly    Past Surgical History:  Procedure Laterality Date   BREAST BIOPSY  12/27/2022   MM RT RADIOACTIVE SEED LOC MAMMO GUIDE 12/27/2022 [facility] MAMMOGRAPHY   BREAST LUMPECTOMY WITH RADIOACTIVE SEED LOCALIZATION Right 12/28/2022   Procedure: RIGHT BREAST SEED LOCALIZED LUMPECTOMY;  Surgeon: Aron Shoulders, MD;  Location: [facility];  Service: General;  Laterality: Right;   C Section     x 1   CARDIAC CATHETERIZATION     x 2   COLONOSCOPY WITH PROPOFOL  N/A 10/15/2020   Procedure: COLONOSCOPY WITH PROPOFOL ;  Surgeon: Rollin Dover, MD;  Location: [facility];  Service: Endoscopy;  Laterality: N/A;   Etopic  pregnacy     EYE SURGERY Right 03/08/2022   glaucoma and cataracts  LUNG BIOPSY     RE-EXCISION OF BREAST LUMPECTOMY Right 01/17/2023   Procedure: RE-EXCISION RIGHT BREAST LUMPECTOMY;  Surgeon: Aron Shoulders, MD;  Location: [facility] OR;  Service: General;  Laterality: Right;  PEC BLOCK   TUBAL LIGATION     UPPER GI ENDOSCOPY      Social History   Socioeconomic History   Marital status: Single    Spouse name: Not on file   Number of children: Not on file   Years of education: Not on file   Highest education level: Not on file  Occupational History   Not on file  Tobacco Use   Smoking status: Former    Current packs/day: 0.00    Types: Cigarettes    Quit date: 2007    Years since quitting:  18.1   Smokeless tobacco: Never  Vaping Use   Vaping status: Never Used  Substance and Sexual Activity   Alcohol use: Yes    Alcohol/week: 2.0 standard drinks of alcohol    Types: 2 Glasses of wine per week    Comment: Drinks wine   Drug use: Never   Sexual activity: Not Currently    Birth control/protection: Post-menopausal, None  Other Topics Concern   Not on file  Social History Narrative   Right Handed    Lives in a one story home - [place]    Social Drivers of Health   Financial Resource Strain: Not on file  Food Insecurity: Food Insecurity Present (02/08/2023)   Hunger Vital Sign    Worried About Running Out of Food in the Last Year: Sometimes true    Ran Out of Food in the Last Year: Never true  Transportation Needs: Unmet Transportation Needs (02/08/2023)   PRAPARE - Administrator, Civil Service (Medical): Yes    Lack of Transportation (Non-Medical): No  Physical Activity: Not on file  Stress: Not on file  Social Connections: Unknown (12/27/2021)   [facility]   Social Network    Social Network: Not on file     FAMILY HISTORY:  We obtained a detailed, 4-generation family history.  Significant diagnoses are listed below: Family History  Problem Relation Age of Onset   Hyperlipidemia Mother    Asthma Mother    Thyroid  disease Mother    Depression Mother    Rheum arthritis Mother    Pancreatic cancer Father    Diabetes Father    Heart attack Brother    Depression Brother    HIV/AIDS Brother    Breast cancer Maternal Aunt        d. 58     The patient has two daughters and a son who are cancer free. She has three brothers and two sisters who are all cancer free.  Her father is deceased and mother is living.  The patient's father died of pancreatic cancer.  There is no additional information on his family.   The patient's mother is living.  She has two sisters, one died of breast cancer at 51. There is no  other reported family history of cancer.  Julie Cruz. Postma is aware of previous family history of genetic testing for hereditary cancer risks. There is no reported Ashkenazi Jewish ancestry. There is no known consanguinity.  GENETIC COUNSELING ASSESSMENT: Julie Cruz. Swader is a 58 y.o. female with a personal and family history of cancer which is somewhat suggestive of a hereditary cancer syndrome and predisposition to cancer given the combination of cancer and ages on  onset. We, therefore, discussed and recommended the following at today's visit.   DISCUSSION: We discussed that, in general, most cancer is not inherited in families, but instead is sporadic or familial. Sporadic cancers occur by chance and typically happen at older ages (>50 years) as this type of cancer is caused by genetic changes acquired during an individual's lifetime. Some families have more cancers than would be expected by chance; however, the ages or types of cancer are not consistent with a known genetic mutation or known genetic mutations have been ruled out. This type of familial cancer is thought to be due to a combination of multiple genetic, environmental, hormonal, and lifestyle factors. While this combination of factors likely increases the risk of cancer, the exact source of this risk is not currently identifiable or testable.  We discussed that 5 - 10% of breast cancer is hereditary, with most cases associated with BRCA mutations.  There are other genes that can be associated with hereditary breast cancer syndromes.  These include ATM, CHEK2 and PALB2.  We discussed that testing is beneficial for several reasons including knowing how to follow individuals after completing their treatment, identifying whether potential treatment options such as PARP inhibitors would be beneficial, and understand if other family members could be at risk for cancer and allow them to undergo genetic testing.   We reviewed the characteristics, features  and inheritance patterns of hereditary cancer syndromes. We also discussed genetic testing, including the appropriate family members to test, the process of testing, insurance coverage and turn-around-time for results. We discussed the implications of a negative, positive, carrier and/or variant of uncertain significant result. Julie Cruz. Ellenberger  was offered a common hereditary cancer panel (36+ genes) and an expanded pan-cancer panel (70+ genes). Julie Cruz. Branscomb was informed of the benefits and limitations of each panel, including that expanded pan-cancer panels contain genes that do not have clear management guidelines at this point in time.  We also discussed that as the number of genes included on a panel increases, the chances of variants of uncertain significance increases. Julie Cruz. Lemberger decided to pursue genetic testing for the CancerNext-Expanded+RNAinsight gene panel.   The CancerNext-Expanded gene panel offered by [facility] and includes sequencing, rearrangement, and RNA analysis for the following 76 genes: AIP, ALK, APC, ATM, AXIN2, BAP1, BARD1, BMPR1A, BRCA1, BRCA2, BRIP1, CDC73, CDH1, CDK4, CDKN1B, CDKN2A, CEBPA, CHEK2, CTNNA1, DDX41, DICER1, ETV6, FH, FLCN, GATA2, LZTR1, MAX, MBD4, MEN1, MET, MLH1, MSH2, MSH3, MSH6, MUTYH, NF1, NF2, NTHL1, PALB2, PHOX2B, PMS2, POT1, PRKAR1A, PTCH1, PTEN, RAD51C, RAD51D, RB1, RET, RUNX1, SDHA, SDHAF2, SDHB, SDHC, SDHD, SMAD4, SMARCA4, SMARCB1, SMARCE1, STK11, SUFU, TMEM127, TP53, TSC1, TSC2, VHL, and WT1 (sequencing and deletion/duplication); EGFR, HOXB13, KIT, MITF, PDGFRA, POLD1, and POLE (sequencing only); EPCAM and GREM1 (deletion/duplication only).    GENETIC TEST RESULTS: Genetic testing through the CancerNext-Expanded+RNAinsight cancer panel found no pathogenic mutations. The test report has been scanned into EPIC and is located under the Molecular Pathology section of the Results Review tab.  A portion of the result report is included below for reference.      We discussed with Julie Cruz. Straka that because current genetic testing is not perfect, it is possible there may be a gene mutation in one of these genes that current testing cannot detect, but that chance is small.  We also discussed, that there could be another gene that has not yet been discovered, or that we have not yet tested, that is responsible for the cancer diagnoses in the family. It  is also possible there is a hereditary cause for the cancer in the family that Julie Cruz. Tello did not inherit and therefore was not identified in her testing.  Therefore, it is important to remain in touch with cancer genetics in the future so that we can continue to offer Julie Cruz. Hamor the most up to date genetic testing.   Genetic testing did identify a variant of uncertain significance (VUS) was identified in the CDKN2A gene called c.178G>A (p.A60T).  At this time, it is unknown if this variant is associated with increased cancer risk or if this is a normal finding, but most variants such as this get reclassified to being inconsequential. It should not be used to make medical management decisions. With time, we suspect the lab will determine the significance of this variant, if any. If we do learn more about it, we will try to contact Julie Cruz. Kindt to discuss it further. However, it is important to stay in touch with us  periodically and keep the address and phone number up to date.  ADDITIONAL GENETIC TESTING: We discussed with Julie Cruz. Warnke that her genetic testing was fairly extensive.  If there are genes identified to increase cancer risk that can be analyzed in the future, we would be happy to discuss and coordinate this testing at that time.    CANCER SCREENING RECOMMENDATIONS: Julie Cruz. Gionfriddo test result is considered negative (normal).  This means that we have not identified a hereditary cause for her personal and family history of cancer at this time. Most cancers happen by chance and this negative test suggests that  her personal and family history of cancer may fall into this category.    Possible reasons for Julie Cruz. Bougher's negative genetic test include:  1. There may be a gene mutation in one of these genes that current testing methods cannot detect but that chance is small.  2. There could be another gene that has not yet been discovered, or that we have not yet tested, that is responsible for the cancer diagnoses in the family.  3.  There may be no hereditary risk for cancer in the family. The cancers in Julie Cruz. Tvedt and/or her family may be sporadic/familial or due to other genetic and environmental factors. 4. It is also possible there is a hereditary cause for the cancer in the family that Julie Cruz. Wiersma did not inherit.  Therefore, it is recommended she continue to follow the cancer management and screening guidelines provided by her oncology and primary healthcare provider. An individual's cancer risk and medical management are not determined by genetic test results alone. Overall cancer risk assessment incorporates additional factors, including personal medical history, family history, and any available genetic information that may result in a personalized plan for cancer prevention and surveillance  RECOMMENDATIONS FOR FAMILY MEMBERS:   Since she did not inherit a identifiable mutation in a cancer predisposition gene included on this panel, her children could not have inherited a known mutation from her in one of these genes. Individuals in this family might be at some increased risk of developing cancer, over the general population risk, simply due to the family history of cancer.  We recommended women in this family have a yearly mammogram beginning at age 15, or 60 years younger than the earliest onset of cancer, an annual clinical breast exam, and perform monthly breast self-exams. Women in this family should also have a gynecological exam as recommended by their primary provider. All family members should  be referred for colonoscopy starting  at age 41, or 69 years younger than the earliest onset of cancer.  FOLLOW-UP: Lastly, we discussed with Julie Cruz. Montanaro that cancer genetics is a rapidly advancing field and it is possible that new genetic tests will be appropriate for her and/or her family members in the future. We encouraged her to remain in contact with cancer genetics on an annual basis so we can update her personal and family histories and let her know of advances in cancer genetics that may benefit this family.   Our contact number was provided. Julie Cruz. Buresh questions were answered to her satisfaction, and she knows she is welcome to call us  at anytime with additional questions or concerns.   Julie Monte, Julie Cruz, [facility] Licensed, Certified Genetic Counselor [email or web address]

## 2023-04-04 NOTE — Radiation Completion Notes (Signed)
 Patient Name: Julie Cruz, Julie Cruz MRN: [id number] Date of Birth: Oct 19, 1965 Referring Physician: MACKEY CHAD, M.D. Date of Service: 2023-04-04 Radiation Oncologist: Lauraine Golden, M.D. 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 Staging on 2023-03-08: Malignant neoplasm of lower-outer quadrant of right breast of female, estrogen receptor positive (HCC) T=pT1a, N=pN0, M=cM0 Intent: Curative     ==========DELIVERED PLANS==========  First Treatment Date: 2023-03-13 Last Treatment Date: 2023-04-03   Plan Name: Breast_R Site: Breast, Right Technique: 3D Mode: Photon Dose Per Fraction: 2.67 Gy Prescribed Dose (Delivered / Prescribed): 40.05 Gy / 40.05 Gy Prescribed Fxs (Delivered / Prescribed): 15 / 15     ==========ON TREATMENT VISIT DATES========== 2023-03-16, 2023-03-20, 2023-03-26, 2023-04-02     ==========UPCOMING VISITS========== 05/16/2023 [facility] MEDICINE NM PET CT MYOCAR SARCOIDOSIS [facility]-NM PET CT 1  05/03/2023 [facility]-RADIATION ONC FOLLOW UP 20 Golden Lauraine, MD  04/18/2023 [facility]G [facility] ECHO CH ST RESTRICTED [facility] ECHO 1  04/11/2023 [facility]-315 CT IMAGING GI CT 20 MIN GI-315 CT 1  04/04/2023 [facility] ONCOLOGY VAN PATIENT [facility]MO VAN  04/04/2023 [facility] ONCOLOGY EST PT 15 Chad Mackey, MD  04/04/2023 [facility]-MED ONCOLOGY GEN COUNSEL 60 Perri Darice CROME, Counselor        ==========APPENDIX - ON TREATMENT VISIT NOTES==========   See weekly On Treatment Notes in Epic for details in the Media tab (listed as Progress notes on the On Treatment Visit Dates listed above).

## 2023-04-05 ENCOUNTER — Telehealth: Payer: Self-pay | Admitting: Hematology and Oncology

## 2023-04-05 ENCOUNTER — Telehealth: Payer: Self-pay

## 2023-04-05 NOTE — Telephone Encounter (Signed)
 Scheduled appointment per 2/5 los. Left VM with appointment details.

## 2023-04-05 NOTE — Telephone Encounter (Signed)
 Per md orders entered for Guardant Reveal and all supported documents faxed to [phone]. Faxed confirmation was received.

## 2023-04-11 ENCOUNTER — Other Ambulatory Visit: Payer: Medicare HMO

## 2023-04-12 ENCOUNTER — Other Ambulatory Visit: Payer: Self-pay | Admitting: Neurological Surgery

## 2023-04-12 DIAGNOSIS — M5136 Other intervertebral disc degeneration, lumbar region with discogenic back pain only: Secondary | ICD-10-CM

## 2023-04-12 DIAGNOSIS — M4714 Other spondylosis with myelopathy, thoracic region: Secondary | ICD-10-CM

## 2023-04-18 ENCOUNTER — Ambulatory Visit (HOSPITAL_COMMUNITY): Payer: Medicare HMO

## 2023-04-19 NOTE — Progress Notes (Addendum)
 Ms. Julie Cruz presents today for a one month telephone follow up. She completed radiation treatment to her right breast on 04/03/2023.   Pain: 3 out of 10 sharp intermittent breast pain. Patient does manage pain with oxycodone and tylenol. Skin: Still experiencing under arm and side of breast darkness. Educated to use vitamin E oil.  ROM: Denies any issues with range of motion. Patient is doing arm exercises. Patient will start physical therapy in April. Lymphedema: Patient denies MedOnc F/U: Patient unsure Other issues of note: Patient experiencing fatigue, provided reassurance that fatigue is temporary and should resolve. Educated patient to get enough sleep in addition to eating and drink well.   Pt reports Yes No Comments  Tamoxifen []  [x]    Letrozole []  [x]    Anastrazole []  [x]    Mammogram []  Date: Patient Unsure. [] 

## 2023-04-23 ENCOUNTER — Telehealth (HOSPITAL_COMMUNITY): Payer: Self-pay

## 2023-04-25 ENCOUNTER — Other Ambulatory Visit (HOSPITAL_COMMUNITY): Payer: Medicare HMO

## 2023-05-01 ENCOUNTER — Telehealth: Payer: Self-pay | Admitting: *Deleted

## 2023-05-01 NOTE — Telephone Encounter (Signed)
 Called pt with negative Guardant results. Pt was appreciative and verbalized understanding. Advised to f/u as scheduled

## 2023-05-02 ENCOUNTER — Encounter: Payer: Self-pay | Admitting: Hematology and Oncology

## 2023-05-03 ENCOUNTER — Ambulatory Visit
Admission: RE | Admit: 2023-05-03 | Discharge: 2023-05-03 | Disposition: A | Discharge status: Home / Self Care | Payer: Medicare HMO | Source: Ambulatory Visit | Attending: Radiation Oncology | Admitting: Radiation Oncology

## 2023-05-03 DIAGNOSIS — Z170 Estrogen receptor positive status [ER+]: Secondary | ICD-10-CM

## 2023-05-04 ENCOUNTER — Ambulatory Visit (HOSPITAL_COMMUNITY): Payer: Medicare HMO | Attending: Pulmonary Disease

## 2023-05-04 ENCOUNTER — Other Ambulatory Visit: Payer: Self-pay | Admitting: *Deleted

## 2023-05-04 DIAGNOSIS — Z780 Asymptomatic menopausal state: Secondary | ICD-10-CM

## 2023-05-04 NOTE — Progress Notes (Signed)
 Verbal orders received from MD and placed for pt to undergo [facility] at [facility].  [facility] will contact pt with appt details.

## 2023-05-07 ENCOUNTER — Other Ambulatory Visit: Payer: Medicare HMO

## 2023-05-07 ENCOUNTER — Telehealth (HOSPITAL_COMMUNITY): Payer: Self-pay | Admitting: Pulmonary Disease

## 2023-05-07 NOTE — Telephone Encounter (Signed)
 Just an FYI. We have made several attempts to contact this patient including sending a letter to schedule or reschedule their echocardiogram. We will be removing the patient from the echo WQ.   05/07/23 PT NO SHOWED and Cancelled x 2 on 04/17/24 and 04/25/23 - [facility]      Thank you

## 2023-05-08 ENCOUNTER — Encounter (HOSPITAL_COMMUNITY): Payer: Self-pay

## 2023-05-08 ENCOUNTER — Ambulatory Visit
Admission: RE | Admit: 2023-05-08 | Discharge: 2023-05-08 | Disposition: A | Discharge status: Home / Self Care | Payer: Medicare HMO | Source: Ambulatory Visit | Attending: Neurological Surgery | Admitting: Neurological Surgery

## 2023-05-08 DIAGNOSIS — M4714 Other spondylosis with myelopathy, thoracic region: Secondary | ICD-10-CM

## 2023-05-08 DIAGNOSIS — M5136 Other intervertebral disc degeneration, lumbar region with discogenic back pain only: Secondary | ICD-10-CM

## 2023-05-14 ENCOUNTER — Encounter: Payer: Self-pay | Admitting: General Practice

## 2023-05-14 NOTE — Progress Notes (Signed)
 [facility] Spiritual Care Note  Received call from Julie Cruz to share about health updates, including new stressor of gyn health questions. She is interested in Virtual Nutrition and Cancer Class for questions about diet and vitamins for promoting health/decreasing cancer risk and has registration number and next available dates. Julie Cruz plans to phone again when she has more updates to process.   [address] [place], [place], [facility] Pager [phone] Voicemail [phone]

## 2023-05-16 ENCOUNTER — Encounter (HOSPITAL_COMMUNITY): Admission: RE | Admit: 2023-05-16 | Payer: Medicare HMO | Source: Ambulatory Visit

## 2023-05-18 ENCOUNTER — Encounter: Payer: Self-pay | Admitting: General Practice

## 2023-05-18 NOTE — Progress Notes (Signed)
 [facility] Spiritual Care Note  Followed up with Julie Cruz per her question about an expedient way to get a psychiatrist referral. Consulted with Social Work; suggested starting by requesting a referral via PCP. Also provided additional resources:  [facility] [phone] [facility] [phone] [facility] [phone]  Julie Cruz reports feeling down, low energy, unmotivated to get up and shower/etc, obliged to "put on a front" (to present as more upbeat and energetic than she feels) for family, and tearful lately. She also reports canceling several medical appointments because keeping them was just too overwhelming.  We plan to follow up Monday morning to check in for accountability about her plan to request a psychiatrist referral from her PCP and for follow-up emotional support in the meantime.   [address] [place], [place], [facility] Pager [phone] Voicemail [phone]

## 2023-05-21 ENCOUNTER — Encounter: Payer: Self-pay | Admitting: General Practice

## 2023-05-21 NOTE — Progress Notes (Signed)
 [facility] Spiritual Care Note  Connected with Julie Cruz by phone for follow-up regarding her steps to pursue a psychiatrist referral. She reports securing an appointment, but planning to phone PCP following our call to request a referral in hopes for a sooner appointment. Julie Cruz plans to follow up with chaplain later in the week to report updates.   [address] [place], [place], [facility] Pager [phone] Voicemail [phone]

## 2023-05-26 ENCOUNTER — Other Ambulatory Visit: Payer: Self-pay | Admitting: Pain Medicine

## 2023-05-26 DIAGNOSIS — G8929 Other chronic pain: Secondary | ICD-10-CM

## 2023-05-31 ENCOUNTER — Other Ambulatory Visit (HOSPITAL_COMMUNITY): Payer: Self-pay | Admitting: Gastroenterology

## 2023-05-31 ENCOUNTER — Ambulatory Visit
Admission: RE | Admit: 2023-05-31 | Discharge: 2023-05-31 | Disposition: A | Discharge status: Home / Self Care | Source: Ambulatory Visit | Attending: Pain Medicine | Admitting: Pain Medicine

## 2023-05-31 DIAGNOSIS — R1032 Left lower quadrant pain: Secondary | ICD-10-CM

## 2023-05-31 DIAGNOSIS — G8929 Other chronic pain: Secondary | ICD-10-CM

## 2023-05-31 DIAGNOSIS — R1012 Left upper quadrant pain: Secondary | ICD-10-CM

## 2023-05-31 MED ORDER — GADOPICLENOL 0.5 MMOL/ML IV SOLN
10.0000 mL | Freq: Once | INTRAVENOUS | Status: AC | PRN
  Administered 2023-05-31: 10 mL via INTRAVENOUS

## 2023-06-02 ENCOUNTER — Encounter (HOSPITAL_COMMUNITY): Payer: Self-pay

## 2023-06-02 ENCOUNTER — Ambulatory Visit (HOSPITAL_COMMUNITY)
Admission: RE | Admit: 2023-06-02 | Discharge: 2023-06-02 | Disposition: A | Discharge status: Home / Self Care | Source: Ambulatory Visit | Attending: Gastroenterology | Admitting: Gastroenterology

## 2023-06-02 DIAGNOSIS — R1012 Left upper quadrant pain: Secondary | ICD-10-CM | POA: Insufficient documentation

## 2023-06-02 DIAGNOSIS — R1032 Left lower quadrant pain: Secondary | ICD-10-CM | POA: Diagnosis present

## 2023-06-02 LAB — POCT I-STAT CREATININE: Creatinine, Ser: 1.1 mg/dL — ABNORMAL HIGH (ref 0.44–1.00)

## 2023-06-02 MED ORDER — IOHEXOL 300 MG/ML  SOLN
100.0000 mL | Freq: Once | INTRAMUSCULAR | Status: AC | PRN
  Administered 2023-06-02: 100 mL via INTRAVENOUS

## 2023-06-02 MED ORDER — SODIUM CHLORIDE (PF) 0.9 % IJ SOLN
INTRAMUSCULAR | Status: AC
  Filled 2023-06-02: qty 50

## 2023-06-07 ENCOUNTER — Other Ambulatory Visit (HOSPITAL_BASED_OUTPATIENT_CLINIC_OR_DEPARTMENT_OTHER)

## 2023-06-19 ENCOUNTER — Telehealth (HOSPITAL_COMMUNITY): Payer: Self-pay | Admitting: *Deleted

## 2023-06-19 NOTE — Telephone Encounter (Signed)
 Patient calling about her upcoming cardiac imaging study. She wanted to verify her date and time of appointment. We discussed her diet prep and she verbalized understanding.  Chase Copping RN Navigator Cardiac Imaging [facility] [phone] office [phone] cell

## 2023-06-21 ENCOUNTER — Encounter (HOSPITAL_COMMUNITY)
Admission: RE | Admit: 2023-06-21 | Discharge: 2023-06-21 | Disposition: A | Discharge status: Home / Self Care | Source: Ambulatory Visit | Attending: Cardiology | Admitting: Cardiology

## 2023-06-21 DIAGNOSIS — I5022 Chronic systolic (congestive) heart failure: Secondary | ICD-10-CM | POA: Insufficient documentation

## 2023-06-21 MED ORDER — FLUDEOXYGLUCOSE F - 18 (FDG) INJECTION
9.0100 | Freq: Once | INTRAVENOUS | Status: AC | PRN
  Administered 2023-06-21: 9.01 via INTRAVENOUS

## 2023-06-21 MED ORDER — RUBIDIUM RB82 GENERATOR (RUBYFILL)
30.0000 | PACK | Freq: Once | INTRAVENOUS | Status: AC
  Administered 2023-06-21: 30 via INTRAVENOUS

## 2023-06-23 LAB — NM PET CT MYOCARDIAL SARCOIDOSIS
LV dias vol: 98 mL (ref 46–106)
LV sys vol: 33 mL
Nuc Stress EF: 65 %
Rest Nuclear Isotope Dose: 30 mCi

## 2023-06-25 ENCOUNTER — Other Ambulatory Visit: Payer: Self-pay | Admitting: Pulmonary Disease

## 2023-06-25 ENCOUNTER — Telehealth (HOSPITAL_COMMUNITY): Payer: Self-pay

## 2023-06-25 NOTE — Telephone Encounter (Addendum)
 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 With her multiple autoimmune issues & cancer, I suggest that she be referred to [facility], [facility] or [facility]. I have discussed this with her previously.   Adi

## 2023-06-26 ENCOUNTER — Other Ambulatory Visit (HOSPITAL_BASED_OUTPATIENT_CLINIC_OR_DEPARTMENT_OTHER)

## 2023-07-06 ENCOUNTER — Telehealth (HOSPITAL_COMMUNITY): Payer: Self-pay

## 2023-07-06 ENCOUNTER — Inpatient Hospital Stay: Payer: Medicare HMO | Attending: Hematology and Oncology | Admitting: Adult Health

## 2023-07-06 ENCOUNTER — Encounter: Payer: Self-pay | Admitting: Adult Health

## 2023-07-06 VITALS — BP 120/80 | HR 90 | Temp 97.5°F | Resp 18 | Wt 310.0 lb

## 2023-07-06 DIAGNOSIS — Z87891 Personal history of nicotine dependence: Secondary | ICD-10-CM | POA: Insufficient documentation

## 2023-07-06 DIAGNOSIS — Z800 Family history of malignant neoplasm of digestive organs: Secondary | ICD-10-CM | POA: Diagnosis not present

## 2023-07-06 DIAGNOSIS — Z803 Family history of malignant neoplasm of breast: Secondary | ICD-10-CM | POA: Diagnosis not present

## 2023-07-06 DIAGNOSIS — C50511 Malignant neoplasm of lower-outer quadrant of right female breast: Secondary | ICD-10-CM | POA: Diagnosis not present

## 2023-07-06 DIAGNOSIS — Z170 Estrogen receptor positive status [ER+]: Secondary | ICD-10-CM | POA: Diagnosis not present

## 2023-07-06 NOTE — Telephone Encounter (Signed)
  ADVANCED HEART FAILURE CLINIC   Pre-operative Risk Assessment       Request for Surgical Clearance    Procedure:  Lumbar Epidural  { Date of Surgery:  Clearance TBD                               { Surgeon:  Dr. Katheryn Pandy, MD Surgeon's Group or Practice Name:  [facility]  Phone number:   Fax number:  [phone]  Type of Clearance Requested:   - Medical  - Pharmacy:  Hold Apixaban  (Eliquis ) 3 days prior?  { Type of Anesthesia:  Not Indicated  Additional requests/questions:  Please fax a copy of clearance to the surgeon's office.  Signed, Xylah Early B Shirla Hodgkiss   07/06/2023, 9:30 AM

## 2023-07-06 NOTE — Progress Notes (Unsigned)
 SURVIVORSHIP VISIT:  BRIEF ONCOLOGIC HISTORY:  Oncology History  Malignant neoplasm of lower-outer quadrant of right breast of female, estrogen receptor positive (HCC)  09/20/2022 Genetic Testing   Negative genetic testing on the CancerNext-Expanded+RNAinsight. CDKN2A VUS identified. Testing was ordered in July 2024 through her OB/GYN office.  The CancerNext-Expanded gene panel offered by [facility] and includes sequencing, rearrangement, and RNA analysis for the following 76 genes: AIP, ALK, APC, ATM, AXIN2, BAP1, BARD1, BMPR1A, BRCA1, BRCA2, BRIP1, CDC73, CDH1, CDK4, CDKN1B, CDKN2A, CEBPA, CHEK2, CTNNA1, DDX41, DICER1, ETV6, FH, FLCN, GATA2, LZTR1, MAX, MBD4, MEN1, MET, MLH1, MSH2, MSH3, MSH6, MUTYH, NF1, NF2, NTHL1, PALB2, PHOX2B, PMS2, POT1, PRKAR1A, PTCH1, PTEN, RAD51C, RAD51D, RB1, RET, RUNX1, SDHA, SDHAF2, SDHB, SDHC, SDHD, SMAD4, SMARCA4, SMARCB1, SMARCE1, STK11, SUFU, TMEM127, TP53, TSC1, TSC2, VHL, and WT1 (sequencing and deletion/duplication); EGFR, HOXB13, KIT, MITF, PDGFRA, POLD1, and POLE (sequencing only); EPCAM and GREM1 (deletion/duplication only).     12/28/2022 Surgery   Right breast lumpectomy: Invasive ductal carcinoma, 1.5 mm, lateral margin positive, ER positive, PR positive, Ki67 2%, HER2- (0+).   01/17/2023 Surgery   Re-excision cleared margins   03/08/2023 Initial Diagnosis   Malignant neoplasm of lower-outer quadrant of right breast of female, estrogen receptor positive (HCC)   03/08/2023 Cancer Staging   Staging form: Breast, AJCC 8th Edition - Pathologic stage from 03/08/2023: Stage IA (pT1a, pN0, cM0, G1, ER+, PR+, HER2-) - Signed by Cameron Cea, MD on 04/04/2023 Stage prefix: Initial diagnosis Method of lymph node assessment: Clinical Multigene prognostic tests performed: None Histologic grading system: 3 grade system   03/13/2023 - 04/03/2023 Radiation Therapy   Plan Name: Breast_R Site: Breast, Right Technique: 3D Mode: Photon Dose Per Fraction: 2.67  Gy Prescribed Dose (Delivered / Prescribed): 40.05 Gy / 40.05 Gy Prescribed Fxs (Delivered / Prescribed): 15 / 15     INTERVAL HISTORY:  Ms. Chase to review her survivorship care plan detailing her treatment course for breast cancer, as well as monitoring long-term side effects of that treatment, education regarding health maintenance, screening, and overall wellness and health promotion.     Overall, Ms. Brenton reports feeling quite well.  She notes increased fatigue.  She has sarcoid and is unable to take antiestrogen therapy.  She tells me that she is in a chronic state of pain, fatigue and discomfort due to her other comorbidities in particular sarcoidosis.   REVIEW OF SYSTEMS:  Review of Systems  Constitutional:  Positive for fatigue. Negative for appetite change, chills, fever and unexpected weight change.  HENT:   Negative for hearing loss, lump/mass and trouble swallowing.   Eyes:  Negative for eye problems and icterus.  Respiratory:  Negative for chest tightness, cough and shortness of breath.   Cardiovascular:  Negative for chest pain, leg swelling and palpitations.  Gastrointestinal:  Negative for abdominal distention, abdominal pain, constipation, diarrhea, nausea and vomiting.  Endocrine: Negative for hot flashes.  Genitourinary:  Negative for difficulty urinating.   Musculoskeletal:  Positive for arthralgias, back pain and myalgias.  Skin:  Negative for itching and rash.  Neurological:  Negative for dizziness, extremity weakness, headaches and numbness.  Hematological:  Negative for adenopathy. Does not bruise/bleed easily.  Psychiatric/Behavioral:  Negative for depression. The patient is not nervous/anxious.    Breast: Denies any new nodularity, masses, tenderness, nipple changes, or nipple discharge.       PAST MEDICAL/SURGICAL HISTORY:  Past Medical History:  Diagnosis Date   Acute medial meniscus tear    Anxiety    Arthritis  Bipolar 1 disorder (HCC)     Bronchitis 12/08/2022   Cancer (HCC)    right breast ca   CHF (congestive heart failure) (HCC)    COPD (chronic obstructive pulmonary disease) (HCC)    Depression    Dyspnea    Family history of breast cancer    Family history of pancreatic cancer    Fibromyalgia    H/O spinal cord injury    T, L ans CSpine   Hypertension    Menopause    Myocardial infarction (HCC)    mild per patient in ? 2014   Pulmonary hypertension (HCC)    Sarcoid, cardiac    Sleep apnea    uses BiPAP nightly   Past Surgical History:  Procedure Laterality Date   BREAST BIOPSY  12/27/2022   MM RT RADIOACTIVE SEED LOC MAMMO GUIDE 12/27/2022 [facility] MAMMOGRAPHY   BREAST LUMPECTOMY WITH RADIOACTIVE SEED LOCALIZATION Right 12/28/2022   Procedure: RIGHT BREAST SEED LOCALIZED LUMPECTOMY;  Surgeon: Lockie Rima, MD;  Location: [facility] OR;  Service: General;  Laterality: Right;   C Section     x 1   CARDIAC CATHETERIZATION     x 2   COLONOSCOPY WITH PROPOFOL  N/A 10/15/2020   Procedure: COLONOSCOPY WITH PROPOFOL ;  Surgeon: Alvis Jourdain, MD;  Location: [facility];  Service: Endoscopy;  Laterality: N/A;   Etopic  pregnacy     EYE SURGERY Right 03/08/2022   glaucoma and cataracts   LUNG BIOPSY     RE-EXCISION OF BREAST LUMPECTOMY Right 01/17/2023   Procedure: RE-EXCISION RIGHT BREAST LUMPECTOMY;  Surgeon: Lockie Rima, MD;  Location: [facility] OR;  Service: General;  Laterality: Right;  PEC BLOCK   TUBAL LIGATION     UPPER GI ENDOSCOPY       ALLERGIES:  No Known Allergies   CURRENT MEDICATIONS:  Outpatient Encounter Medications as of 07/06/2023  Medication Sig   acetaminophen  (TYLENOL ) 500 MG tablet Take 500-750 mg by mouth every 8 (eight) hours as needed for mild pain (pain score 1-3) or moderate pain (pain score 4-6).   Calcium-Magnesium-Zinc (CAL-MAG-ZINC PO) Take 1 tablet by mouth daily.   clonazePAM  (KLONOPIN ) 1 MG disintegrating tablet Take 1 mg by mouth 2 (two) times daily.   Cold Packs (ICE PACK) MISC 1  each by Other route daily. Heating pad and ice pack in towel   cyclobenzaprine  (FLEXERIL ) 10 MG tablet Take 20 mg by mouth daily as needed.   dorzolamide  (TRUSOPT ) 2 % ophthalmic solution Place 1 drop into the right eye 3 (three) times daily.   ELIQUIS  5 MG TABS tablet TAKE 1 TABLET TWICE DAILY   folic acid (FOLVITE) 1 MG tablet Take 1 mg by mouth daily.   hydrochlorothiazide (HYDRODIURIL) 50 MG tablet Take 50 mg by mouth in the morning.   ipratropium-albuterol  (DUONEB) 0.5-2.5 (3) MG/3ML SOLN Take 3 mLs by nebulization every 6 (six) hours as needed.   lisinopril  (ZESTRIL ) 40 MG tablet Take 1 tablet (40 mg total) by mouth in the morning.   LUMIGAN 0.01 % SOLN Place 1 drop into the left eye at bedtime.   Menthol, Topical Analgesic, (ICY HOT EX) Apply 1 application  topically daily as needed (pain).   methotrexate  (RHEUMATREX) 2.5 MG tablet Take 12.5 mg by mouth See admin instructions. Take only on Tuesday's twice a day 12.5 in the morning and 12.5 mg in the evening   Multiple Vitamins-Minerals (MULTIVITAMIN WITH MINERALS) tablet Take 1 tablet by mouth daily. No iron   oxyCODONE -acetaminophen  (PERCOCET) 10-325 MG tablet  Take 2 tablets by mouth 2 (two) times daily.   pantoprazole  (PROTONIX ) 40 MG tablet Take 40 mg by mouth in the morning.   potassium chloride  (KLOR-CON ) 10 MEQ tablet Take 20 mEq by mouth in the morning. May take a third 10 meq tablet as needed for palpitations   Protein POWD Take 2 Scoops by mouth 3 (three) times a week.   sertraline  (ZOLOFT ) 100 MG tablet Take 200 mg by mouth in the morning.   traZODone  (DESYREL ) 50 MG tablet Take 50 mg by mouth at bedtime as needed for sleep.   verapamil  (VERELAN ) 360 MG 24 hr capsule Take 1 capsule (360 mg total) by mouth in the morning. (Patient taking differently: Take 360 mg by mouth in the morning. May take a second 360 mg as needed for palpitations)   No facility-administered encounter medications on file as of 07/06/2023.     ONCOLOGIC  FAMILY HISTORY:  Family History  Problem Relation Age of Onset   Hyperlipidemia Mother    Asthma Mother    Thyroid  disease Mother    Depression Mother    Rheum arthritis Mother    Pancreatic cancer Father    Diabetes Father    Heart attack Brother    Depression Brother    HIV/AIDS Brother    Breast cancer Maternal Aunt        d. 19     SOCIAL HISTORY:  Social History   Socioeconomic History   Marital status: Single    Spouse name: Not on file   Number of children: Not on file   Years of education: Not on file   Highest education level: Not on file  Occupational History   Not on file  Tobacco Use   Smoking status: Former    Current packs/day: 0.00    Types: Cigarettes    Quit date: 2007    Years since quitting: 18.3   Smokeless tobacco: Never  Vaping Use   Vaping status: Never Used  Substance and Sexual Activity   Alcohol use: Yes    Alcohol/week: 2.0 standard drinks of alcohol    Types: 2 Glasses of wine per week    Comment: Drinks wine   Drug use: Never   Sexual activity: Not Currently    Birth control/protection: Post-menopausal, None  Other Topics Concern   Not on file  Social History Narrative   Right Handed    Lives in a one story home - [place]    Social Drivers of Health   Financial Resource Strain: Not on file  Food Insecurity: Food Insecurity Present (02/08/2023)   Hunger Vital Sign    Worried About Running Out of Food in the Last Year: Sometimes true    Ran Out of Food in the Last Year: Never true  Transportation Needs: Unmet Transportation Needs (02/08/2023)   PRAPARE - Administrator, Civil Service (Medical): Yes    Lack of Transportation (Non-Medical): No  Physical Activity: Not on file  Stress: Not on file  Social Connections: Unknown (12/27/2021)   Received from [facility], [facility]   Social Network    Social Network: Not on file  Intimate Partner Violence: Unknown (12/27/2021)   Received from [facility],  [facility]   HITS    Physically Hurt: Not on file    Insult or Talk Down To: Not on file    Threaten Physical Harm: Not on file    Scream or Curse: Not on file  OBSERVATIONS/OBJECTIVE:  BP 120/80 (BP Location: Left Arm, Patient Position: Sitting)   Pulse 90   Temp (!) 97.5 F (36.4 C) (Temporal)   Resp 18   Wt (!) 310 lb (140.6 kg) Comment: patient did not weigh.. she stated this is what she weighed this morning before getting dressed  SpO2 96%   BMI 43.24 kg/m  GENERAL: Patient is a well appearing female in no acute distress HEENT:  Sclerae anicteric.  Oropharynx clear and moist. No ulcerations or evidence of oropharyngeal candidiasis. Neck is supple.  NODES:  No cervical, supraclavicular, or axillary lymphadenopathy palpated.  BREAST EXAM:  right breast s/p lumpectomy and radiation, no sign of local recurrence, left breast benign LUNGS:  Clear to auscultation bilaterally.  No wheezes or rhonchi. HEART:  Regular rate and rhythm. No murmur appreciated. ABDOMEN:  Soft, nontender.  Positive, normoactive bowel sounds. No organomegaly palpated. MSK:  No focal spinal tenderness to palpation. Full range of motion bilaterally in the upper extremities. EXTREMITIES:  No peripheral edema.   SKIN:  Clear with no obvious rashes or skin changes. No nail dyscrasia. NEURO:  Nonfocal. Well oriented.  Appropriate affect.  LABORATORY DATA:  None for this visit.  DIAGNOSTIC IMAGING:  None for this visit.   ASSESSMENT AND PLAN:  Ms.. Renew is a pleasant 58 y.o. female with Stage IA right breast invasive ductal carcinoma, ER+/PR+/HER2-, diagnosed in 11/2022, treated with lumpectomy, adjuvant radiation therapy.  She presents to the Survivorship Clinic for our initial meeting and routine follow-up post-completion of treatment for breast cancer.    1. Stage IA right breast cancer:  Ms. Bade is continuing to recover from definitive treatment for breast cancer. She will follow-up with her  medical oncologist, Dr.  Lee Public in 6 months with history and physical exam per surveillance protocol.  We also discussed Guardant Reveal testing for minimal residual disease monitoring.  Her mammogram is due 09/2023; orders placed today.   Today, a comprehensive survivorship care plan and treatment summary was reviewed with the patient today detailing her breast cancer diagnosis, treatment course, potential late/long-term effects of treatment, appropriate follow-up care with recommendations for the future, and patient education resources.  A copy of this summary, along with a letter will be sent to the patient's primary care provider via mail/fax/In Basket message after today's visit.    2. Bone health:  She was given education on specific activities to promote bone health.  3. Cancer screening:  Due to Ms. Goudeau's history and her age, she should receive screening for skin cancers, colon cancer, and gynecologic cancers.  The information and recommendations are listed on the patient's comprehensive care plan/treatment summary and were reviewed in detail with the patient.    4. Health maintenance and wellness promotion: Ms. Martinz was encouraged to consume 5-7 servings of fruits and vegetables per day. We reviewed the "Nutrition Rainbow" handout.  She was also encouraged to engage in moderate to vigorous exercise for 30 minutes per day most days of the week.  She was instructed to limit her alcohol consumption and continue to abstain from tobacco use.     5. Support services/counseling: It is not uncommon for this period of the patient's cancer care trajectory to be one of many emotions and stressors.   She was given information regarding our available services and encouraged to contact me with any questions or for help enrolling in any of our support group/programs.    Follow up instructions:    -Return to cancer center in 6  months for f/u with Dr. Gudena  -Mammogram due in 09/2023 -She is welcome to  return back to the Survivorship Clinic at any time; no additional follow-up needed at this time.  -Consider referral back to survivorship as a long-term survivor for continued surveillance  The patient was provided an opportunity to ask questions and all were answered. The patient agreed with the plan and demonstrated an understanding of the instructions.   Total encounter time:40 minutes*in face-to-face visit time, chart review, lab review, care coordination, order entry, and documentation of the encounter time.    Alwin Baars, NP 07/06/23 1:33 PM Medical Oncology and Hematology [facility] [address] [place], [place] [address] Tel. [phone]    Fax. [phone]  *Total Encounter Time as defined by the Centers for Medicare and Medicaid Services includes, in addition to the face-to-face time of a patient visit (documented in the note above) non-face-to-face time: obtaining and reviewing outside history, ordering and reviewing medications, tests or procedures, care coordination (communications with other health care professionals or caregivers) and documentation in the medical record.

## 2023-07-09 ENCOUNTER — Telehealth: Payer: Self-pay | Admitting: Hematology and Oncology

## 2023-07-09 NOTE — Telephone Encounter (Signed)
 [place], [place], [place]  You3 days ago    She is at acceptable risk for procedure from CV standpoint. We do not manage her Eliquis , pharmacy clinic with [facility] will need to give [facility] hold recs    Please fax once completed- thank you

## 2023-07-09 NOTE — Telephone Encounter (Signed)
 Left voicemail regarding upcoming appointments

## 2023-07-09 NOTE — Telephone Encounter (Signed)
 Please advise holding Eliquis prior to lumbar ESI.  Thank you!  DW

## 2023-07-10 ENCOUNTER — Telehealth (HOSPITAL_BASED_OUTPATIENT_CLINIC_OR_DEPARTMENT_OTHER): Payer: Self-pay

## 2023-07-10 NOTE — Telephone Encounter (Signed)
   Name: Julie Cruz  DOB: [birth year]  MRN: [id number]   Primary Cardiologist: Wendie Hamburg, MD  Chart reviewed as part of pre-operative protocol coverage. Julie Cruz was last seen on 03/21/2023 by Dr. Bruce Caper.  Per Vernia Good, FNP in advanced heart failure clinic "She is at acceptable risk for procedure from CV standpoint."  Therefore, based on ACC/AHA guidelines, the patient would be an acceptable risk for the planned procedure without further cardiovascular testing.   Eliquis  prescribed by a noncardiology provider (pulmonology) therefore recommendations for holding deferred to prescribing provider.    I will route this recommendation to the requesting party via Epic fax function and remove from pre-op pool. Please call with questions.  Morey Ar, NP 07/10/2023, 3:49 PM

## 2023-07-10 NOTE — Telephone Encounter (Signed)
 Patient is taking Eliquis  for PE/DVT. Prescribed by Dr. Diania Fortes with pulmonary. Will defer to pulmonary.

## 2023-07-10 NOTE — Telephone Encounter (Signed)
 Please advise on med change I think Julie Cruz was ordered for future at her visit in Dec  Copied from CRM [id number]. Topic: General - Other >> Jul 10, 2023  4:48 PM Julie Cruz wrote: Reason for CRM: patient calling because she needs to get an echocardiogram, she is confused who she needs to speak to in regards of her treatment, patient is on methotrexate  (RHEUMATREX) and want to be switched to prednisone . Please call patient with further assistance.

## 2023-07-11 ENCOUNTER — Telehealth (HOSPITAL_COMMUNITY): Payer: Self-pay | Admitting: Cardiology

## 2023-07-11 ENCOUNTER — Telehealth: Payer: Self-pay | Admitting: Pulmonary Disease

## 2023-07-11 NOTE — Telephone Encounter (Signed)
 PT calling about questions she has about her care. Please see last tel encounter to us  for more details. Please contact PT @ [phone] She is a little upset because of her illness and states no one has called back.. States she had a simple question. When I read last encounter notes to her she said that it was wrong.Simple question  is as follows.  Wants to know if she should add on Pred to her meds.

## 2023-07-11 NOTE — Telephone Encounter (Signed)
 Patient would like to know if its ok to change meds to prednisone  for sarcoid treatment  Aware she was referred to [facility] for further management however she is still waiting for an appt.  In the meantime would like to start prednisone     Please advise

## 2023-07-12 MED ORDER — ENOXAPARIN SODIUM 150 MG/ML IJ SOSY: 150.0000 mg | PREFILLED_SYRINGE | Freq: Two times a day (BID) | INTRAMUSCULAR | 0 refills | Status: AC

## 2023-07-12 NOTE — Telephone Encounter (Signed)
 Spoke w/ PT verbalized understanding she will reschedule her echo ,but had done a Ct , Pet w/ another provider those e are available in chart

## 2023-07-12 NOTE — Telephone Encounter (Signed)
 Spoke with patient to discuss anticoagulation bridging schedule prior to lumbar epidural by [facility]. Patient initially reluctant to take Lovenox , but agreed after discussion that it would be in her best interest in order to undergo the epidural.   Anticoagulation plan is as below:   Anticoagulation Plan  2 days before injection Hold Eliquis   Take Lovenox  150 mg under the skin once in the morning and again in the evening 12 hours later  1 day before injection  Hold Eliquis   Take Lovenox  150 mg under the skin once in the morning SKIP evening dose  Day of injection  Hold Eliquis  and Lovenox    Post-injection Julie Cruz will let you know when to restart Eliquis     Julie Cruz, PharmD [facility] PGY-1

## 2023-07-12 NOTE — Addendum Note (Signed)
 Addended by: Burna Carrier on: 07/12/2023 04:28 PM   Modules accepted: Orders

## 2023-07-13 NOTE — Telephone Encounter (Signed)
Pt aware.

## 2023-07-13 NOTE — Telephone Encounter (Signed)
 LMOM

## 2023-07-17 ENCOUNTER — Other Ambulatory Visit (HOSPITAL_BASED_OUTPATIENT_CLINIC_OR_DEPARTMENT_OTHER)

## 2023-07-19 ENCOUNTER — Ambulatory Visit (HOSPITAL_BASED_OUTPATIENT_CLINIC_OR_DEPARTMENT_OTHER)

## 2023-07-31 ENCOUNTER — Ambulatory Visit (HOSPITAL_BASED_OUTPATIENT_CLINIC_OR_DEPARTMENT_OTHER)
Admission: RE | Admit: 2023-07-31 | Discharge: 2023-07-31 | Disposition: A | Discharge status: Home / Self Care | Source: Ambulatory Visit | Attending: Hematology and Oncology | Admitting: Hematology and Oncology

## 2023-07-31 DIAGNOSIS — Z780 Asymptomatic menopausal state: Secondary | ICD-10-CM | POA: Insufficient documentation

## 2023-08-02 ENCOUNTER — Other Ambulatory Visit (HOSPITAL_COMMUNITY): Payer: Self-pay | Admitting: Cardiology

## 2023-08-08 ENCOUNTER — Telehealth: Payer: Self-pay | Admitting: *Deleted

## 2023-08-08 NOTE — Telephone Encounter (Signed)
 Received call from pt to review recent bone density report.  Per MD pt scan is WNL.  Pt educated and verbalized understanding.

## 2023-08-28 ENCOUNTER — Ambulatory Visit: Payer: Self-pay

## 2023-08-28 ENCOUNTER — Encounter (HOSPITAL_COMMUNITY): Payer: Self-pay

## 2023-08-28 ENCOUNTER — Emergency Department (HOSPITAL_COMMUNITY)

## 2023-08-28 ENCOUNTER — Emergency Department (HOSPITAL_COMMUNITY)
Admission: EM | Admit: 2023-08-28 | Discharge: 2023-08-28 | Disposition: A | Discharge status: Home / Self Care | Source: Ambulatory Visit | Attending: Emergency Medicine | Admitting: Emergency Medicine

## 2023-08-28 ENCOUNTER — Other Ambulatory Visit: Payer: Self-pay

## 2023-08-28 DIAGNOSIS — Z79899 Other long term (current) drug therapy: Secondary | ICD-10-CM | POA: Insufficient documentation

## 2023-08-28 DIAGNOSIS — Z853 Personal history of malignant neoplasm of breast: Secondary | ICD-10-CM | POA: Insufficient documentation

## 2023-08-28 DIAGNOSIS — R609 Edema, unspecified: Secondary | ICD-10-CM | POA: Diagnosis not present

## 2023-08-28 DIAGNOSIS — Z86711 Personal history of pulmonary embolism: Secondary | ICD-10-CM | POA: Insufficient documentation

## 2023-08-28 DIAGNOSIS — Z7901 Long term (current) use of anticoagulants: Secondary | ICD-10-CM | POA: Insufficient documentation

## 2023-08-28 DIAGNOSIS — I10 Essential (primary) hypertension: Secondary | ICD-10-CM | POA: Insufficient documentation

## 2023-08-28 DIAGNOSIS — Z86718 Personal history of other venous thrombosis and embolism: Secondary | ICD-10-CM | POA: Diagnosis not present

## 2023-08-28 DIAGNOSIS — M79605 Pain in left leg: Secondary | ICD-10-CM | POA: Diagnosis present

## 2023-08-28 LAB — CBC WITH DIFFERENTIAL/PLATELET
Abs Immature Granulocytes: 0.01 10*3/uL (ref 0.00–0.07)
Basophils Absolute: 0 10*3/uL (ref 0.0–0.1)
Basophils Relative: 0 %
Eosinophils Absolute: 0.1 10*3/uL (ref 0.0–0.5)
Eosinophils Relative: 2 %
HCT: 39.6 % (ref 36.0–46.0)
Hemoglobin: 12.8 g/dL (ref 12.0–15.0)
Immature Granulocytes: 0 %
Lymphocytes Relative: 38 %
Lymphs Abs: 1.8 10*3/uL (ref 0.7–4.0)
MCH: 32.5 pg (ref 26.0–34.0)
MCHC: 32.3 g/dL (ref 30.0–36.0)
MCV: 100.5 fL — ABNORMAL HIGH (ref 80.0–100.0)
Monocytes Absolute: 0.6 10*3/uL (ref 0.1–1.0)
Monocytes Relative: 13 %
Neutro Abs: 2.3 10*3/uL (ref 1.7–7.7)
Neutrophils Relative %: 47 %
Platelets: 157 10*3/uL (ref 150–400)
RBC: 3.94 MIL/uL (ref 3.87–5.11)
RDW: 15.9 % — ABNORMAL HIGH (ref 11.5–15.5)
WBC: 4.9 10*3/uL (ref 4.0–10.5)
nRBC: 0 % (ref 0.0–0.2)

## 2023-08-28 LAB — BASIC METABOLIC PANEL WITH GFR
Anion gap: 9 (ref 5–15)
BUN: 10 mg/dL (ref 6–20)
CO2: 28 mmol/L (ref 22–32)
Calcium: 9.1 mg/dL (ref 8.9–10.3)
Chloride: 100 mmol/L (ref 98–111)
Creatinine, Ser: 1.2 mg/dL — ABNORMAL HIGH (ref 0.44–1.00)
GFR, Estimated: 53 mL/min — ABNORMAL LOW (ref >60)
Glucose, Bld: 100 mg/dL — ABNORMAL HIGH (ref 70–99)
Potassium: 3.3 mmol/L — ABNORMAL LOW (ref 3.5–5.1)
Sodium: 137 mmol/L (ref 135–145)

## 2023-08-28 LAB — BRAIN NATRIURETIC PEPTIDE: B Natriuretic Peptide: 24.3 pg/mL (ref 0.0–100.0)

## 2023-08-28 NOTE — Telephone Encounter (Signed)
 FYI Only or Action Required?: FYI only for provider.  Patient is followed in Pulmonology for Sarcoidosis, last seen on 02/19/2023 by Kara Dorn NOVAK, MD. Called Nurse Triage reporting Leg Pain. Symptoms began a week ago. Interventions attempted: Maintenance inhaler. Symptoms are: gradually worsening.  Triage Disposition: Go to ED Now (Notify PCP)  Patient/caregiver understands and will follow disposition?: Yes    Copied from CRM [id number]. Topic: Clinical - Red Word Triage >> Aug 28, 2023 11:52 AM Rilla NOVAK wrote: Red Word that prompted transfer to Nurse Triage: previous blood clot legs/lungs. Patient not feeling well. Pain in leg, unbearable. Reason for Disposition  Chest pain  Answer Assessment - Initial Assessment Questions E2C2 Pulmonary Triage - Initial Assessment Questions Chief Complaint (e.g., cough, sob, wheezing, fever, chills, sweat or additional symptoms) *Go to specific symptom protocol after initial questions. Pain in legs/calf same pain I had when I had a blood clot I want to know if you can do something to check my leg out... because I do not want to go to the ED Has been using IceyHot and then wrapping with ACE wrap-- hot to touch, painful  How long have symptoms been present? X 1-2 weeks  Have you tested for COVID or Flu? Note: If not, ask patient if a home test can be taken. If so, instruct patient to call back for positive results. No  MEDICINES:   Have you used any OTC meds to help with symptoms? No If yes, ask What medications? N/a  Have you used your inhalers/maintenance medication? Yes If yes, What medications? DuoNeb Proventil   If inhaler, ask How many puffs and how often? Note: Review instructions on medication in the chart. As prescribed  OXYGEN : Do you wear supplemental oxygen ? Yes If yes, How many liters are you supposed to use? 2L O2 while sleeping  Do you monitor your oxygen  levels? No If yes, What is your reading  (oxygen  level) today? N/a  What is your usual oxygen  saturation reading?  (Note: Pulmonary O2 sats should be 90% or greater) N/a     1. ONSET: When did the pain start?      See above 2. LOCATION: Where is the pain located?      L leg/calf 3. PAIN: How bad is the pain?    (Scale 1-10; or mild, moderate, severe)   -  MILD (1-3): doesn't interfere with normal activities    -  MODERATE (4-7): interferes with normal activities (e.g., work or school) or awakens from sleep, limping    -  SEVERE (8-10): excruciating pain, unable to do any normal activities, unable to walk     severe 4. WORK OR EXERCISE: Has there been any recent work or exercise that involved this part of the body?      denies 5. CAUSE: What do you think is causing the leg pain?     Possible DVT 6. OTHER SYMPTOMS: Do you have any other symptoms? (e.g., chest pain, back pain, breathing difficulty, swelling, rash, fever, numbness, weakness)     Warm to touch, red, swollen, numbness, tingling Intermittent chest tightness Denies SOB 7. PREGNANCY: Is there any chance you are pregnant? When was your last menstrual period?     N/a  Protocols used: Leg Pain-A-AH

## 2023-08-28 NOTE — Discharge Instructions (Addendum)
 ED Discharge Orders          Ordered    LE VENOUS  Status:  ACTIVE      08/28/23 2051    LE Venous       Comments: IMPORTANT PATIENT INSTRUCTIONS: You have been scheduled for an Outpatient Vascular Study at [facility].  If tomorrow is a Saturday, Sunday or holiday, please go to the [facility] Emergency Department Registration Desk at 11 am tomorrow morning and tell them you are there for a vascular study.  If tomorrow is a weekday (Monday-Friday), please go to the [facility] (address [address], [place]) at 8 am and report to the 4th floor registration Zone A.  Inform registration that you are there for a vascular study.   08/28/23 2052

## 2023-08-28 NOTE — ED Triage Notes (Signed)
 Patient arrived reporting LLW pain/swelling. 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 Patient denies recent travel or any known illness. No other issues reported. Denies shob at this time.

## 2023-08-28 NOTE — ED Provider Notes (Signed)
 [place] EMERGENCY DEPARTMENT AT [facility] Provider Note   CSN: [id number] Arrival date & time: 08/28/23  1733     Patient presents with: Leg Swelling and Leg Pain   Julie Cruz is a 58 y.o. female.   The history is provided by the patient and medical records. No language interpreter was used.  Leg Pain  58 year old female significant history of general anxiety disorder, DVT, PE, breast cancer, sarcoidosis, pulmonary hypertension, who presenting with complaints of left leg pain.  Patient states she has had persistent pain about her left leg ongoing for several months.  Pain is more localized to her left calf.  Pain felt similar to prior DVT that she has had in the past.  She does not endorse any fever chills no productive cough or hemoptysis.  She denies any recent injury.  She has been very compliant with her Eliquis .  She noticed progressive worsening pain and thought to reach out to her doctor to request for an ultrasound of her left leg.  However she spoke with the on-call nurse who instructed patient to come to the ER.  Patient otherwise denies any numbness or weakness.  Prior to Admission medications   Medication Sig Start Date End Date Taking? Authorizing Provider  acetaminophen  (TYLENOL ) 500 MG tablet Take 500-750 mg by mouth every 8 (eight) hours as needed for mild pain (pain score 1-3) or moderate pain (pain score 4-6).    [provider]  Calcium-Magnesium-Zinc (CAL-MAG-ZINC PO) Take 1 tablet by mouth daily.    [provider]  clonazePAM  (KLONOPIN ) 1 MG disintegrating tablet Take 1 mg by mouth 2 (two) times daily. 01/02/23   [provider]  Cold Packs (ICE PACK) MISC 1 each by Other route daily. Heating pad and ice pack in towel    [provider]  cyclobenzaprine  (FLEXERIL ) 10 MG tablet Take 20 mg by mouth daily as needed.    [provider]  dorzolamide  (TRUSOPT ) 2 % ophthalmic solution Place 1 drop into the  right eye 3 (three) times daily. 04/07/21   [provider]  ELIQUIS  5 MG TABS tablet TAKE 1 TABLET TWICE DAILY 06/26/23   Kara Dorn NOVAK, MD  enoxaparin  (LOVENOX ) 150 MG/ML injection Inject 1 mL (150 mg total) into the skin every 12 (twelve) hours. **Before lumbar epidural 07/12/23   Kara Dorn NOVAK, MD  folic acid (FOLVITE) 1 MG tablet Take 1 mg by mouth daily. 01/03/22   [provider]  hydrochlorothiazide (HYDRODIURIL) 50 MG tablet Take 50 mg by mouth in the morning. 06/09/20   [provider]  ipratropium-albuterol  (DUONEB) 0.5-2.5 (3) MG/3ML SOLN Take 3 mLs by nebulization every 6 (six) hours as needed. 06/26/23   Kara Dorn NOVAK, MD  lisinopril  (ZESTRIL ) 40 MG tablet Take 1 tablet (40 mg total) by mouth in the morning. 07/27/22   Kate Lonni CROME, MD  LUMIGAN 0.01 % SOLN Place 1 drop into the left eye at bedtime. 05/11/21   [provider]  Menthol, Topical Analgesic, (ICY HOT EX) Apply 1 application  topically daily as needed (pain).    [provider]  methotrexate  (RHEUMATREX) 2.5 MG tablet Take 12.5 mg by mouth See admin instructions. Take only on Tuesday's twice a day 12.5 in the morning and 12.5 mg in the evening    [provider]  Multiple Vitamins-Minerals (MULTIVITAMIN WITH MINERALS) tablet Take 1 tablet by mouth daily. No iron    [provider]  oxyCODONE -acetaminophen  (PERCOCET) 10-325 MG  tablet Take 2 tablets by mouth 2 (two) times daily. 04/29/21   [provider]  pantoprazole  (PROTONIX ) 40 MG tablet Take 40 mg by mouth in the morning. 06/09/20   [provider]  potassium chloride  (KLOR-CON ) 10 MEQ tablet Take 20 mEq by mouth in the morning. May take a third 10 meq tablet as needed for palpitations 06/09/20   [provider]  Protein POWD Take 2 Scoops by mouth 3 (three) times a week.    [provider]  sertraline  (ZOLOFT ) 100 MG tablet Take 200 mg by mouth in the morning.  06/09/20   [provider]  traZODone  (DESYREL ) 50 MG tablet Take 50 mg by mouth at bedtime as needed for sleep.    [provider]  verapamil  (VERELAN ) 360 MG 24 hr capsule TAKE 1 CAPSULE EVERY MORNING 08/02/23   Sabharwal, Aditya, DO    Allergies: Patient has no known allergies.    Review of Systems  All other systems reviewed and are negative.   Updated Vital Signs BP 139/80 (BP Location: Left Leg)   Pulse 95   Temp 98 F (36.7 C) (Oral)   Resp 18   Ht 5' 11 (1.803 m)   Wt (!) 140 kg   SpO2 100%   BMI 43.05 kg/m   Physical Exam Vitals and nursing note reviewed.  Constitutional:      General: She is not in acute distress.    Appearance: She is well-developed. She is obese.  HENT:     Head: Atraumatic.   Eyes:     Conjunctiva/sclera: Conjunctivae normal.    Cardiovascular:     Rate and Rhythm: Normal rate and regular rhythm.     Pulses: Normal pulses.     Heart sounds: Normal heart sounds.  Pulmonary:     Effort: Pulmonary effort is normal.  Abdominal:     Palpations: Abdomen is soft.   Musculoskeletal:        General: Tenderness (Left lower extremity: Tenderness to palpation of the calf and ankle without any overlying edema erythema or warmth noted.  Intact DP pulse with brisk cap refill) present.     Cervical back: Neck supple.   Skin:    Findings: No rash.   Neurological:     Mental Status: She is alert. Mental status is at baseline.   Psychiatric:        Mood and Affect: Mood normal.     (all labs ordered are listed, but only abnormal results are displayed) Labs Reviewed  BASIC METABOLIC PANEL WITH GFR - Abnormal; Notable for the following components:      Result Value   Potassium 3.3 (*)    Glucose, Bld 100 (*)    Creatinine, Ser 1.20 (*)    GFR, Estimated 53 (*)    All other components within normal limits  CBC WITH DIFFERENTIAL/PLATELET - Abnormal; Notable for the following components:   MCV 100.5 (*)    RDW 15.9 (*)     All other components within normal limits  BRAIN NATRIURETIC PEPTIDE    EKG: None  Radiology: DG Chest 2 View Result Date: 08/28/2023 CLINICAL DATA:  Left lower extremity pain and swelling. Edema. Chest discomfort. History of sarcoid EXAM: CHEST - 2 VIEW COMPARISON:  12/08/2022 FINDINGS: Perihilar and peribronchial thickening. No focal consolidation, pneumothorax or pleural effusion. Normal cardiomediastinal silhouette. No acute bone abnormality. IMPRESSION: Bronchitis/reactive airways. Electronically Signed   By: Norman Gatlin M.D.   On: 08/28/2023 19:30  Procedures   Medications Ordered in the ED - No data to display  Clinical Course as of 08/28/23 2051  Tue Aug 28, 2023  2045 Potassium(!): 3.3 [SS]    Clinical Course User Index [SS] Stotelmyre, Sera, Student-PA                                 Medical Decision Making Amount and/or Complexity of Data Reviewed Labs: ordered. Radiology: ordered.   BP 139/80 (BP Location: Left Leg)   Pulse 95   Temp 98 F (36.7 C) (Oral)   Resp 18   Ht 5' 11 (1.803 m)   Wt (!) 140 kg   SpO2 100%   BMI 43.05 kg/m   17:32 PM 58 year old female significant history of general anxiety disorder, DVT, PE, breast cancer, sarcoidosis, pulmonary hypertension, who presenting with complaints of left leg pain.  Patient states she has had persistent pain about her left leg ongoing for several months.  Pain is more localized to her left calf.  Pain felt similar to prior DVT that she has had in the past.  She does not endorse any fever chills no productive cough or hemoptysis.  She denies any recent injury.  She has been very compliant with her Eliquis .  She noticed progressive worsening pain and thought to reach out to her doctor to request for an ultrasound of her left leg.  However she spoke with the on-call nurse who instructed patient to come to the ER.  Patient otherwise denies any numbness or weakness.  On exam patient is resting comfortably  appears to be in no acute discomfort.  She has some tenderness about her left calf on her left lower extremity and left ankle however she does not have any overlying skin changes no swelling no erythema no warmth.  She is neurovascular intact.  Her lungs are clear.  No significant signs of peripheral edema.  Labs obtained independent viewed interpreted by me with normal BNP.  Potassium is 3.3, creatinine is 1.2.  Chest x-ray obtained showing bronchiolitis/reactive airway changes however patient has history of sarcoidosis and this is likely related to that.  She does not endorse any significant shortness of breath or cough or any cold symptoms.  At this time, I will order for a vascular ultrasound study to be done tomorrow as we do not have ultrasound availability at this time of the day.  Patient is compliant with her Eliquis  therefore I do not think Lovenox  injections indicated at this moment. \  DDx: DVT, cellulitis, sciatica, malignancy, fracture, dislocation, strain, sprain, contusion      Final diagnoses:  Left leg pain    ED Discharge Orders          Ordered    LE VENOUS  Status:  Canceled        08/28/23 2051    LE Venous       Comments: IMPORTANT PATIENT INSTRUCTIONS: You have been scheduled for an Outpatient Vascular Study at [facility].  If tomorrow is a Saturday, Sunday or holiday, please go to the [facility] Emergency Department Registration Desk at 11 am tomorrow morning and tell them you are there for a vascular study.  If tomorrow is a weekday (Monday-Friday), please go to the [facility] (address [address] [place]) at 8 am and report to the 4th floor registration Zone A.  Inform registration that you are there  for a vascular study.   08/28/23 2052               Nivia Colon, PA-C 08/28/23 [id number]    Armenta Canning, MD 09/06/23 KENITH

## 2023-08-29 ENCOUNTER — Telehealth: Payer: Self-pay | Admitting: *Deleted

## 2023-08-29 NOTE — Telephone Encounter (Signed)
 Patient called and some questions as follow up to her ER visit to [facility] yesterday.  She stated she was at the [facility] ER yesterday for 3 hours and was told that the technicial for US  was gone for the day and scheduled her for dopplers to be done at [address] [place],  [place]  [address].  I provided her with the phone number listed as well.  She said she is taking transportation to the appointment. I spoke with the patient and she wanted to know what hospital she was admitted to in 2023 when she had the DVT initially.  I told her I di not see an admission for February of 2023, there was only an ED visit for March of 2023 and it was not for a DVT.  I went through her chart telling her when she had been to [facility] and when she had been to [facility].  I advised her that they are 2 separate hospitals, however they are in the same system, [facility] is just a smaller hospital than [facility].  She wanted to know where we send our patients for CT scans.  I told her we generally send them to an outpatient facility like [facility] on [address].  I told her dopplers we sometimes send to the hospital if we suspect a DVT.  She verbalized understanding.  Nothing further needed.

## 2023-08-29 NOTE — Telephone Encounter (Signed)
 Copied from CRM [id number]. Topic: Clinical - Medical Advice >> Aug 29, 2023  2:14 PM Leila C wrote: Reason for CRM: Patient [phone] states called yesterday and spoke with NT regarding a possible blood clot in left leg and was advised to go to ER. Patient was in the [facility] ER for hours and was told the radiology department for vascular was not closed and to go to [facility] to do the vascular ultrasound. Patient states urgent and wants to speak with nurse now and find out what hospital she went 2 years ago, because she had the same issues. Warm transferred CAL.  See phone note from today, 08/29/23.  Nothing further needed.

## 2023-08-30 ENCOUNTER — Ambulatory Visit (HOSPITAL_COMMUNITY)
Admission: RE | Admit: 2023-08-30 | Discharge: 2023-08-30 | Disposition: A | Discharge status: Home / Self Care | Source: Ambulatory Visit | Attending: Vascular Surgery | Admitting: Vascular Surgery

## 2023-08-30 DIAGNOSIS — Z86711 Personal history of pulmonary embolism: Secondary | ICD-10-CM | POA: Insufficient documentation

## 2023-08-30 DIAGNOSIS — M7989 Other specified soft tissue disorders: Secondary | ICD-10-CM | POA: Diagnosis not present

## 2023-08-30 DIAGNOSIS — M79605 Pain in left leg: Secondary | ICD-10-CM | POA: Diagnosis present

## 2023-08-30 DIAGNOSIS — Z86718 Personal history of other venous thrombosis and embolism: Secondary | ICD-10-CM | POA: Insufficient documentation

## 2023-09-19 NOTE — Progress Notes (Signed)
 BNP to assess for potential heart failure as pt report leg swelling

## 2023-10-18 ENCOUNTER — Other Ambulatory Visit: Payer: Self-pay | Admitting: *Deleted

## 2023-10-18 DIAGNOSIS — Z170 Estrogen receptor positive status [ER+]: Secondary | ICD-10-CM

## 2023-10-18 NOTE — Progress Notes (Signed)
 Received call from pt stating stating her mammogram orders need to be sent to [facility] imaging. Pt also requesting breast US  of right breast because she is experiencing pain, swelling, warmth and redness.Orders faxed to [facility] at [phone].  RN also advised pt that she will need to call surgeons office for assessment of right breast discomfort with pt hx of right breast surgery.  Pt verbalized understanding.

## 2023-10-31 ENCOUNTER — Telehealth: Payer: Self-pay

## 2023-10-31 NOTE — Telephone Encounter (Signed)
 Called pt per MD to advise [employer] testing was negative/not detected. Pt verbalized understanding of results and knows [facility] will be in touch to schedule 6 mo repeat lab.

## 2023-11-02 ENCOUNTER — Encounter: Payer: Self-pay | Admitting: Hematology and Oncology

## 2023-12-17 ENCOUNTER — Ambulatory Visit: Admitting: Adult Health

## 2023-12-17 ENCOUNTER — Ambulatory Visit: Payer: Self-pay | Admitting: Adult Health

## 2023-12-17 NOTE — Telephone Encounter (Signed)
 Patient called to cancel her appointment today and be rescheduled. Patient endorses anxiety attack today. Patient with long history of anxiety. Patient endorses taking her anxiety medication prior to calling. Patient recommended to speak with PCP in regards to her anxiety. Patient verbalized understanding and all questions answered. Patient was transferred to agent to be rescheduled.   FYI Only or Action Required?: FYI only for provider.  Patient is followed in Pulmonology for Sarcoidosis, last seen on 02/19/2023 by Kara Dorn NOVAK, MD.  Called Nurse Triage reporting Anxiety.  Symptoms began several months ago.  Interventions attempted: Nothing.  Symptoms are: stable.  Triage Disposition: See Physician Within 24 Hours  Patient/caregiver understands and will follow disposition?: Yes  Copied from CRM #[id number]. Topic: Clinical - Red Word Triage >> Dec 17, 2023  1:21 PM Rozanna MATSU wrote: [facility] that prompted transfer to Nurse Triage: anxiety attack Reason for Disposition  Patient sounds very upset or troubled to the triager  Answer Assessment - Initial Assessment Questions 1. CONCERN: Did anything happen that prompted you to call today?      Increased anxiety 2. ANXIETY SYMPTOMS: Can you describe how you (your loved one; patient) have been feeling? (e.g., tense, restless, panicky, anxious, keyed up, overwhelmed, sense of impending doom).      Anxiety, overwhelmed 3. ONSET: How long have you been feeling this way? (e.g., hours, days, weeks)     Several weeks 4. SEVERITY: How would you rate the level of anxiety? (e.g., 0 - 10; or mild, moderate, severe).     moderate 5. FUNCTIONAL IMPAIRMENT: How have these feelings affected your ability to do daily activities? Have you had more difficulty than usual doing your normal daily activities? (e.g., getting better, same, worse; self-care, school, work, interactions)     Feels like anxiety is affecting her ability to do daily  activities 6. HISTORY: Have you felt this way before? Have you ever been diagnosed with an anxiety problem in the past? (e.g., generalized anxiety disorder, panic attacks, PTSD). If Yes, ask: How was this problem treated? (e.g., medicines, counseling, etc.)     yes 7. RISK OF HARM - SUICIDAL IDEATION: Do you ever have thoughts of hurting or killing yourself? If Yes, ask:  Do you have these feelings now? Do you have a plan on how you would do this?     no 8. TREATMENT:  What has been done so far to treat this anxiety? (e.g., medicines, relaxation strategies). What has helped?     medications 9. THERAPIST: Do you have a counselor or therapist? If Yes, ask: What is their name?     no 10. POTENTIAL TRIGGERS: Do you drink caffeinated beverages (e.g., coffee, colas, teas), and how much daily? Do you drink alcohol or use any drugs? Have you started any new medicines recently?       NA 11. PATIENT SUPPORT: Who is with you now? Who do you live with? Do you have family or friends who you can talk to?        Daughter is with her 89. OTHER SYMPTOMS: Do you have any other symptoms? (e.g., feeling depressed, trouble concentrating, trouble sleeping, trouble breathing, palpitations or fast heartbeat, chest pain, sweating, nausea, or diarrhea)       Trouble concentrating, trouble sleeping,  Protocols used: Anxiety and Panic Attack-A-AH

## 2024-01-07 NOTE — Assessment & Plan Note (Deleted)
 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   Surveillance plan: Apart from annual mammograms we will do guardant reveal for MRD testing. Return to clinic in 1 year for follow-up

## 2024-01-08 ENCOUNTER — Inpatient Hospital Stay: Attending: Hematology and Oncology | Admitting: Hematology and Oncology

## 2024-01-08 DIAGNOSIS — C50511 Malignant neoplasm of lower-outer quadrant of right female breast: Secondary | ICD-10-CM | POA: Insufficient documentation

## 2024-01-08 DIAGNOSIS — Z170 Estrogen receptor positive status [ER+]: Secondary | ICD-10-CM | POA: Insufficient documentation

## 2024-01-08 DIAGNOSIS — G9332 Myalgic encephalomyelitis/chronic fatigue syndrome: Secondary | ICD-10-CM | POA: Insufficient documentation

## 2024-01-23 ENCOUNTER — Inpatient Hospital Stay: Admitting: Hematology and Oncology

## 2024-01-23 ENCOUNTER — Inpatient Hospital Stay

## 2024-01-23 VITALS — BP 135/82 | HR 71 | Temp 97.6°F | Resp 20 | Ht 71.0 in | Wt 305.0 lb

## 2024-01-23 DIAGNOSIS — Z170 Estrogen receptor positive status [ER+]: Secondary | ICD-10-CM

## 2024-01-23 DIAGNOSIS — C50511 Malignant neoplasm of lower-outer quadrant of right female breast: Secondary | ICD-10-CM

## 2024-01-23 DIAGNOSIS — G9332 Myalgic encephalomyelitis/chronic fatigue syndrome: Secondary | ICD-10-CM | POA: Diagnosis not present

## 2024-01-23 MED ORDER — CYANOCOBALAMIN 1000 MCG/ML IJ SOLN
1000.0000 ug | Freq: Once | INTRAMUSCULAR | Status: AC
  Administered 2024-01-23: 1000 ug via INTRAMUSCULAR
  Filled 2024-01-23: qty 1

## 2024-01-23 NOTE — Progress Notes (Signed)
 Patient Care Team: Ilah Crigler, MD as PCP - General (Family Medicine) Kate Lonni CROME, MD as PCP - Cardiology (Cardiology) Odean Potts, MD as Consulting Physician (Hematology and Oncology) Izell Domino, MD as Attending Physician (Radiation Oncology) Aron Shoulders, MD as Consulting Physician (General Surgery) Maree Paticia BRAVO, MD as Referring Physician (Ophthalmology)  DIAGNOSIS:  Encounter Diagnosis  Name Primary?   Malignant neoplasm of lower-outer quadrant of right breast of female, estrogen receptor positive (HCC) Yes    SUMMARY OF ONCOLOGIC HISTORY: Oncology History  Malignant neoplasm of lower-outer quadrant of right breast of female, estrogen receptor positive (HCC)  09/20/2022 Genetic Testing   Negative genetic testing on the CancerNext-Expanded+RNAinsight. CDKN2A VUS identified. Testing was ordered in July 2024 through her OB/GYN office.  The CancerNext-Expanded gene panel offered by [facility] and includes sequencing, rearrangement, and RNA analysis for the following 76 genes: AIP, ALK, APC, ATM, AXIN2, BAP1, BARD1, BMPR1A, BRCA1, BRCA2, BRIP1, CDC73, CDH1, CDK4, CDKN1B, CDKN2A, CEBPA, CHEK2, CTNNA1, DDX41, DICER1, ETV6, FH, FLCN, GATA2, LZTR1, MAX, MBD4, MEN1, MET, MLH1, MSH2, MSH3, MSH6, MUTYH, NF1, NF2, NTHL1, PALB2, PHOX2B, PMS2, POT1, PRKAR1A, PTCH1, PTEN, RAD51C, RAD51D, RB1, RET, RUNX1, SDHA, SDHAF2, SDHB, SDHC, SDHD, SMAD4, SMARCA4, SMARCB1, SMARCE1, STK11, SUFU, TMEM127, TP53, TSC1, TSC2, VHL, and WT1 (sequencing and deletion/duplication); EGFR, HOXB13, KIT, MITF, PDGFRA, POLD1, and POLE (sequencing only); EPCAM and GREM1 (deletion/duplication only).     12/28/2022 Surgery   Right breast lumpectomy: Invasive ductal carcinoma, 1.5 mm, lateral margin positive, ER positive, PR positive, Ki67 2%, HER2- (0+).   01/17/2023 Surgery   Re-excision cleared margins   03/08/2023 Cancer Staging   Staging form: Breast, AJCC 8th Edition - Pathologic stage from 03/08/2023:  Stage IA (pT1a, pN0, cM0, G1, ER+, PR+, HER2-) - Signed by Odean Potts, MD on 04/04/2023 Stage prefix: Initial diagnosis Method of lymph node assessment: Clinical Multigene prognostic tests performed: None Histologic grading system: 3 grade system   03/13/2023 - 04/03/2023 Radiation Therapy   Plan Name: Breast_R Site: Breast, Right Technique: 3D Mode: Photon Dose Per Fraction: 2.67 Gy Prescribed Dose (Delivered / Prescribed): 40.05 Gy / 40.05 Gy Prescribed Fxs (Delivered / Prescribed): 15 / 15     CHIEF COMPLIANT: Surveillance of breast cancer  HISTORY OF PRESENT ILLNESS:   History of Present Illness Julie MESKE is a 58 year old female with sarcoidosis who presents with severe fatigue and pain management issues. She is accompanied by her daughter.   She has debilitating fatigue and pain that limit basic activities such as cleaning and laundry. Her daughter sometimes has to apply pressure to her back for relief. She feels her sarcoidosis symptoms contribute to her current condition.  She completed radiation therapy in March 2025 and had cancer surgery one year ago. Since then she has had persistent, severe fatigue that she likens to chronic fatigue syndrome. She notes worsening fatigue and difficulty concentrating since COVID-19, with ongoing impact on daily functioning.  She has pain in her spine, ankle, and knee. Orthopedics recently gave two injections and knee braces for advanced knee disease requiring replacement that is currently deferred. She also reports ankle bone spurs with prior x-ray.  She takes long-term methotrexate  for sarcoidosis involving her vision and uses a heating pad and pain medications for symptom control. She follows a high-protein diet and uses herbal teas and protein supplements for energy.  Her pain and fatigue restrict participation in family activities and household tasks. She is concerned about maintaining her role at home and tries to manage  symptoms  discreetly around her granddaughter.     ALLERGIES:  has no known allergies.  MEDICATIONS:  Current Outpatient Medications  Medication Sig Dispense Refill   acetaminophen  (TYLENOL ) 500 MG tablet Take 500-750 mg by mouth every 8 (eight) hours as needed for mild pain (pain score 1-3) or moderate pain (pain score 4-6).     Calcium-Magnesium-Zinc (CAL-MAG-ZINC PO) Take 1 tablet by mouth daily.     clonazePAM  (KLONOPIN ) 1 MG disintegrating tablet Take 1 mg by mouth 2 (two) times daily.     cyclobenzaprine  (FLEXERIL ) 10 MG tablet Take 20 mg by mouth daily as needed.     dorzolamide  (TRUSOPT ) 2 % ophthalmic solution Place 1 drop into the right eye 3 (three) times daily.     ELIQUIS  5 MG TABS tablet TAKE 1 TABLET TWICE DAILY 180 tablet 3   enoxaparin  (LOVENOX ) 150 MG/ML injection Inject 1 mL (150 mg total) into the skin every 12 (twelve) hours. **Before lumbar epidural 3 mL 0   folic acid (FOLVITE) 1 MG tablet Take 1 mg by mouth daily.     hydrochlorothiazide (HYDRODIURIL) 50 MG tablet Take 50 mg by mouth in the morning.     ipratropium-albuterol  (DUONEB) 0.5-2.5 (3) MG/3ML SOLN Take 3 mLs by nebulization every 6 (six) hours as needed. 120 mL 3   lisinopril  (ZESTRIL ) 40 MG tablet Take 1 tablet (40 mg total) by mouth in the morning. 90 tablet 3   LUMIGAN 0.01 % SOLN Place 1 drop into the left eye at bedtime.     Menthol, Topical Analgesic, (ICY HOT EX) Apply 1 application  topically daily as needed (pain).     methotrexate  (RHEUMATREX) 2.5 MG tablet Take 12.5 mg by mouth See admin instructions. Take only on Tuesday's twice a day 12.5 in the morning and 12.5 mg in the evening     Multiple Vitamins-Minerals (MULTIVITAMIN WITH MINERALS) tablet Take 1 tablet by mouth daily. No iron     oxyCODONE -acetaminophen  (PERCOCET) 10-325 MG tablet Take 2 tablets by mouth 2 (two) times daily.     pantoprazole  (PROTONIX ) 40 MG tablet Take 40 mg by mouth in the morning.     potassium chloride  (KLOR-CON ) 10 MEQ tablet  Take 20 mEq by mouth in the morning. May take a third 10 meq tablet as needed for palpitations     Protein POWD Take 2 Scoops by mouth 3 (three) times a week.     sertraline  (ZOLOFT ) 100 MG tablet Take 200 mg by mouth in the morning.     traZODone  (DESYREL ) 50 MG tablet Take 50 mg by mouth at bedtime as needed for sleep.     verapamil  (VERELAN ) 360 MG 24 hr capsule TAKE 1 CAPSULE EVERY MORNING 90 capsule 3   Cold Packs (ICE PACK) MISC 1 each by Other route daily. Heating pad and ice pack in towel (Patient not taking: Reported on 01/23/2024)     No current facility-administered medications for this visit.    PHYSICAL EXAMINATION: ECOG PERFORMANCE STATUS: 1 - Symptomatic but completely ambulatory  Vitals:   01/23/24 1000  BP: 135/82  Pulse: 71  Resp: 20  Temp: 97.6 F (36.4 C)  SpO2: 97%   Filed Weights   01/23/24 1000  Weight: (!) 305 lb (138.3 kg)    Physical Exam   (exam performed in the presence of a chaperone)  LABORATORY DATA:  I have reviewed the data as listed    Latest Ref Rng & Units 08/28/2023    7:26 PM 06/02/2023  11:34 AM 01/17/2023   10:32 AM  CMP  Glucose 70 - 99 mg/dL 899   94   BUN 6 - 20 mg/dL 10   8   Creatinine 9.55 - 1.00 mg/dL 8.79  8.89  8.91   Sodium 135 - 145 mmol/L 137   139   Potassium 3.5 - 5.1 mmol/L 3.3   3.4   Chloride 98 - 111 mmol/L 100   100   CO2 22 - 32 mmol/L 28   27   Calcium 8.9 - 10.3 mg/dL 9.1   9.1     Lab Results  Component Value Date   WBC 4.9 08/28/2023   HGB 12.8 08/28/2023   HCT 39.6 08/28/2023   MCV 100.5 (H) 08/28/2023   PLT 157 08/28/2023   NEUTROABS 2.3 08/28/2023    ASSESSMENT & PLAN:  Malignant neoplasm of lower-outer quadrant of right breast of female, 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, heart) Diffuse body pains, chronic fatigue, severe hot flashes, endometrial hypertrophy, history of DVT, pulmonary hypertension   August 2024: 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   Surveillance plan:  Breast exam 01/23/2024: Benign  Mammogram  guardant reveal for MRD testing 10/23/2023: Negative  Chronic fatigue syndrome: Will start B12 injections monthly. Return to clinic in 1 year for follow-up ------------------------------------- Assessment and Plan Assessment & Plan History of estrogen receptor positive right breast cancer, status post surgery and radiation Completed surgery and radiation therapy in March 2025. Recent mammogram in September was normal. Hormone therapy blockers contraindicated due to history of DVT and body pains. - Continue annual mammograms.  Chronic pain syndrome Severe pain in spine, ankle, and knees. Current pain management includes acetaminophen  and oxycodone -acetaminophen . Discussed risks of long-term narcotic use and emphasized non-narcotic options.  Myalgic encephalomyelitis/chronic fatigue syndrome Persistent fatigue and weakness, possibly exacerbated by sarcoidosis and menopausal symptoms. Discussed potential benefits of B12 supplementation for energy levels. - Ordered B12 injection and will assess effectiveness. - Consider monthly B12 injections if beneficial.  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 Recent x-ray showed bone spurs and Achilles issues. Methotrexate  likely continued long-term due to sarcoidosis and vision concerns. - Continue methotrexate  as prescribed by  ophthalmologist. - Consider taking prednisone  for inflammation as advised by orthopedic specialist.  Menopausal symptoms Experiencing severe hot flashes and other menopausal symptoms. Hormone therapy contraindicated due to history of DVT and body pains. - Continue current management without hormone therapy.      No orders of the defined types were placed in this encounter.  The patient has a good understanding of the overall plan. she agrees with it. she will call with any problems that may develop before the next visit here.  I personally spent a total of 30 minutes in the care of the patient today including preparing to see the patient, getting/reviewing separately obtained history, performing a medically appropriate exam/evaluation, counseling and educating, placing orders, referring and communicating with other health care professionals, documenting clinical information in the EHR, independently  interpreting results, communicating results, and coordinating care.   Viinay K Jeweldean Drohan, MD 01/23/24

## 2024-01-23 NOTE — Assessment & Plan Note (Signed)
 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   Surveillance plan:  Breast exam 01/23/2024: Benign  Mammogram  guardant reveal for MRD testing 10/23/2023: Negative  Return to clinic in 1 year for follow-up

## 2024-01-29 ENCOUNTER — Ambulatory Visit: Admitting: Pulmonary Disease

## 2024-02-19 ENCOUNTER — Encounter: Payer: Self-pay | Admitting: *Deleted

## 2024-02-20 ENCOUNTER — Inpatient Hospital Stay: Attending: Hematology and Oncology

## 2024-02-20 DIAGNOSIS — G9332 Myalgic encephalomyelitis/chronic fatigue syndrome: Secondary | ICD-10-CM | POA: Diagnosis present

## 2024-02-20 MED ORDER — CYANOCOBALAMIN 1000 MCG/ML IJ SOLN
1000.0000 ug | Freq: Once | INTRAMUSCULAR | Status: AC
  Administered 2024-02-20: 1000 ug via INTRAMUSCULAR
  Filled 2024-02-20: qty 1

## 2024-03-13 ENCOUNTER — Ambulatory Visit

## 2024-03-13 ENCOUNTER — Ambulatory Visit: Admitting: Adult Health

## 2024-03-13 ENCOUNTER — Encounter: Payer: Self-pay | Admitting: Adult Health

## 2024-03-13 VITALS — BP 106/75 | HR 87 | Temp 97.4°F | Ht 71.0 in | Wt 309.0 lb

## 2024-03-13 DIAGNOSIS — G4733 Obstructive sleep apnea (adult) (pediatric): Secondary | ICD-10-CM

## 2024-03-13 DIAGNOSIS — Z86711 Personal history of pulmonary embolism: Secondary | ICD-10-CM

## 2024-03-13 DIAGNOSIS — I2609 Other pulmonary embolism with acute cor pulmonale: Secondary | ICD-10-CM

## 2024-03-13 DIAGNOSIS — D869 Sarcoidosis, unspecified: Secondary | ICD-10-CM | POA: Diagnosis not present

## 2024-03-13 DIAGNOSIS — Z6841 Body Mass Index (BMI) 40.0 and over, adult: Secondary | ICD-10-CM | POA: Diagnosis not present

## 2024-03-13 DIAGNOSIS — I5022 Chronic systolic (congestive) heart failure: Secondary | ICD-10-CM

## 2024-03-13 DIAGNOSIS — F419 Anxiety disorder, unspecified: Secondary | ICD-10-CM

## 2024-03-13 DIAGNOSIS — H209 Unspecified iridocyclitis: Secondary | ICD-10-CM

## 2024-03-13 DIAGNOSIS — F32A Depression, unspecified: Secondary | ICD-10-CM | POA: Diagnosis not present

## 2024-03-13 DIAGNOSIS — D8689 Sarcoidosis of other sites: Secondary | ICD-10-CM

## 2024-03-13 DIAGNOSIS — Z87891 Personal history of nicotine dependence: Secondary | ICD-10-CM

## 2024-03-13 DIAGNOSIS — I2699 Other pulmonary embolism without acute cor pulmonale: Secondary | ICD-10-CM

## 2024-03-13 NOTE — Patient Instructions (Addendum)
 Continue on Methotrexate .  Follow up with Rheumatology next month as planned  Follow up with Ophthalmology as planned this sprin.  Continue on BIPAP At bedtime  with Oxygen  .  Albuterol  inhaler As needed   Continue on Eliquis  5mg  Twice daily   Chest xray today.  Refer to Cardiology for follow up .  Follow up with Dr. Kassie in 4 months with PFT (new patient slot -Sarcoid)

## 2024-03-13 NOTE — Progress Notes (Signed)
 "  @Patient  ID: Julie Cruz, female    DOB: August 30, 1965, 59 y.o.   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  Treated initially with steroids. Followed by rheumatology and ophthalmology on methotrexate      CTA Chest 04/25/22 1. Acute pulmonary embolus with moderate embolic burden and CT evidence of right heart strain (RV/LV equals 1.47). 2. Minimal scattered nodular infiltrates within the upper lobes bilaterally which may be infectious or inflammatory in the acute setting.   CTA Chest 07/16/21 1. No evidence of pulmonary embolism. 2. Subtle perilymphatic nodular thickening most bilaterally with mild mosaic attenuation. Appearance can be seen in the setting of pulmonary sarcoidosis. A nonspecific viral bronchiolitis also result in this appearance. 3. Redemonstration of multiple enlarged mediastinal and bilateral hilar lymph nodes, likely related to history of sarcoidosis. 4. Mildly dilated main pulmonary trunk, which can be seen in the setting of pulmonary arterial hypertension.   HRCT Chest 10/19/20 1. Examination of the lungs is somewhat limited by body habitus and related photopenia. Within this limitation, there is mild, bandlike bibasilar scarring and or partial atelectasis without specific findings of pulmonary parenchymal sarcoidosis or other fibrotic interstitial lung disease. 2. Numerous prominent mediastinal and  hilar lymph nodes, nonspecific although potentially in keeping with nodal sarcoidosis. 3. Lobular air trapping on expiratory phase imaging, suggestive of small airways disease. 4. Enlargement of the main pulmonary artery, as can be seen in pulmonary hypertension.   MRI Cardiac 02/07/2019 - report reviewed from records from [place] Multiple conglomerated lymph nodes within the superior prevascular, right paratracheal and bilateral hilar, precarinal and subcarinal lymph nodes measuring 3.8 x 2.8cm para tracheal nodes. Bilateral multiple subpectoral and axillary lymph nodes measuring up to 1.1 x 0.8cm. New bilateral hypermetabolic diffuse groundglass and subcentimeter nodular opacities.    Echo 09/28/20: EF 70-75%. Mild LVH. Grade I diastolic dysfunction. RV systolic function is normal. RV size is normal. LA is mildly dilated.    EKG 09/15/20: Normal sinus rhythm, PR 160, QRS 94, Qtc 493   LHC 04/24/2017 - from [place] records LVEDP mildly elevated LV systolic function is normal   PET CT 05/2022 -FDG uptake findings are consistent with active myocardial inflammation/sarcoidosis.   FDG uptake was observed. FDG uptake was focal. FDG uptake was present in the basal inferior and inferolateral segment(s). LV perfusion is normal. There is no evidence of infarction.   Excellent patient dietary preparation.   Left ventricular function is normal. EF: 60 %. End diastolic cavity size is normal. End systolic cavity size is normal.  CT chest April 24, 2022 showed minimal scattered nodular infiltrates in the upper lobe, no adenopathy. PFTs October 2022 showed moderate to severe restriction with no airflow obstruction. FEV1 49%, ratio 79, FVC 50%, DLCO 81%.   Sleep study 02/2021 AHI 37/hr , titration portion optimal BiPAP therapy on 21/17 cm H2O with a Medium size Fisher&Paykel Full Face Mask Simplus mask and heated humidification with 2L oxygen  piped in to system   Discussed the use  of AI scribe software for clinical  note transcription with the patient, who gave verbal consent to proceed.  History of Present Illness Julie Cruz is a 59 year old female with sarcoidosis and sleep apnea who presents for follow-up   She continues to use a BiPAP machine with a full face mask for sleep apnea, achieving about five to six hours of sleep per night. However, she experiences poor sleep quality, insomnia, and often sleeps in short pockets.  BiPAP download download shows 90% compliance.  Daily average usage at 5.5 hours.  AHI 2.2/hour.  Patient is on auto BiPAP IPAP max 21, EPAP minimum 17 pressure support 4.  She uses a fullface mask.    She is followed for chronic sarcoidosis with multisystem involvement.  She is followed by ophthalmology for uveitis.  She is followed by cardiology with cardiac involvement noted on previous PET cardiac CT.  She has been recommended by cardiology for referral to [facility] or [facility] but has declined.  Repeat PET cardiac CT April 2025 showed findings consistent with active myocardial inflammation/sarcoidosis. She remains on methotrexate .  Follows with ophthalmology on a routine basis. She has a history of pulmonary embolism endorses compliance on Eliquis .  Denies any hemoptysis.  Patient education given on anticoagulation therapy  She reports chronic pain, including bone spurs in her ankle, and has received injections in both knees. She requires knee replacements but needs to reach a certain weight before surgery.   She has a complex social situation, caring for a 57 year old autistic daughter and a 54-year-old granddaughter, which adds to her stress. She feels trapped by her health and circumstances, expressing a desire to run away.  Says that she is under a lot of stress.  Does have anxiety and depression that is managed by primary care..     Allergies[1]  Immunization History  Administered Date(s) Administered   Influenza, Seasonal, Injecte, Preservative Fre 12/19/2022    Influenza,inj,Quad PF,6+ Mos 12/15/2020   PNEUMOCOCCAL CONJUGATE-20 12/15/2020    Past Medical History:  Diagnosis Date   Acute medial meniscus tear    Anxiety    Arthritis    Bipolar 1 disorder (HCC)    Bronchitis 12/08/2022   Cancer (HCC)    right breast ca   CHF (congestive heart failure) (HCC)    COPD (chronic obstructive pulmonary disease) (HCC)    Depression    Dyspnea    Family history of breast cancer    Family history of pancreatic cancer    Fibromyalgia    H/O spinal cord injury    T, L ans CSpine   Hypertension    Menopause    Myocardial infarction (HCC)    mild per patient in ? 2014   Pulmonary hypertension (HCC)    Sarcoid, cardiac    Sleep apnea    uses BiPAP nightly    Tobacco History: Tobacco Use History[2] Counseling given: Not Answered   Outpatient Medications Prior to Visit  Medication Sig Dispense Refill   acetaminophen  (TYLENOL ) 500 MG tablet Take 500-750 mg by mouth every 8 (eight) hours as needed for mild pain (pain score 1-3) or moderate pain (pain score 4-6).     Calcium-Magnesium-Zinc (CAL-MAG-ZINC PO) Take 1 tablet by mouth daily.     clonazePAM  (KLONOPIN ) 1 MG disintegrating tablet Take 1 mg by mouth 2 (two) times daily.     Cold Packs (ICE PACK) MISC 1 each by Other route daily. Heating pad and ice pack in towel     Cyanocobalamin  (  B-12 COMPLIANCE INJECTION IJ) Inject as directed every 30 (thirty) days.     cyclobenzaprine  (FLEXERIL ) 10 MG tablet Take 20 mg by mouth daily as needed.     dorzolamide  (TRUSOPT ) 2 % ophthalmic solution Place 1 drop into the right eye 3 (three) times daily.     ELIQUIS  5 MG TABS tablet TAKE 1 TABLET TWICE DAILY 180 tablet 3   folic acid (FOLVITE) 1 MG tablet Take 1 mg by mouth daily.     hydrochlorothiazide (HYDRODIURIL) 50 MG tablet Take 50 mg by mouth in the morning.     ipratropium-albuterol  (DUONEB) 0.5-2.5 (3) MG/3ML SOLN Take 3 mLs by nebulization every 6 (six) hours as needed. 120 mL 3   lisinopril   (ZESTRIL ) 40 MG tablet Take 1 tablet (40 mg total) by mouth in the morning. 90 tablet 3   LUMIGAN 0.01 % SOLN Place 1 drop into the left eye at bedtime.     Menthol, Topical Analgesic, (ICY HOT EX) Apply 1 application  topically daily as needed (pain).     methotrexate  (RHEUMATREX) 2.5 MG tablet Take 12.5 mg by mouth See admin instructions. Take only on Tuesday's twice a day 12.5 in the morning and 12.5 mg in the evening     Multiple Vitamins-Minerals (MULTIVITAMIN WITH MINERALS) tablet Take 1 tablet by mouth daily. No iron     oxyCODONE -acetaminophen  (PERCOCET) 10-325 MG tablet Take 2 tablets by mouth 2 (two) times daily.     pantoprazole  (PROTONIX ) 40 MG tablet Take 40 mg by mouth in the morning.     potassium chloride  (KLOR-CON ) 10 MEQ tablet Take 20 mEq by mouth in the morning. May take a third 10 meq tablet as needed for palpitations     Protein POWD Take 2 Scoops by mouth 3 (three) times a week.     sertraline  (ZOLOFT ) 100 MG tablet Take 200 mg by mouth in the morning.     traZODone  (DESYREL ) 50 MG tablet Take 50 mg by mouth at bedtime as needed for sleep.     verapamil  (VERELAN ) 360 MG 24 hr capsule TAKE 1 CAPSULE EVERY MORNING 90 capsule 3   enoxaparin  (LOVENOX ) 150 MG/ML injection Inject 1 mL (150 mg total) into the skin every 12 (twelve) hours. **Before lumbar epidural (Patient not taking: Reported on 03/13/2024) 3 mL 0   No facility-administered medications prior to visit.     Review of Systems:   Constitutional:   No  weight loss, night sweats,  Fevers, chills, +fatigue, or  lassitude.  HEENT:   No headaches,  Difficulty swallowing,  Tooth/dental problems, or  Sore throat,                No sneezing, itching, ear ache, nasal congestion, post nasal drip,   CV:  No chest pain,  Orthopnea, PND, swelling in lower extremities, anasarca, dizziness, palpitations, syncope.   GI  No heartburn, indigestion, abdominal pain, nausea, vomiting, diarrhea, change in bowel habits, loss of  appetite, bloody stools.   Resp:    No chest wall deformity  Skin: no rash or lesions.  GU: no dysuria, change in color of urine, no urgency or frequency.  No flank pain, no hematuria   MS:  +joint pain     Physical Exam  BP 106/75   Pulse 87   Temp (!) 97.4 F (36.3 C)   Ht 5' 11 (1.803 m) Comment: Per pt  Wt (!) 309 lb (140.2 kg) Comment: Per pt  SpO2 94% Comment: RA  BMI  43.10 kg/m   GEN: A/Ox3; pleasant , NAD, well nourished , cane    HEENT:  [place]/AT,  , NOSE-clear, THROAT-clear, no lesions, no postnasal drip or exudate noted.   NECK:  Supple w/ fair ROM; no JVD; normal carotid impulses w/o bruits; no thyromegaly or nodules palpated; no lymphadenopathy.    RESP  Clear  P & A; w/o, wheezes/ rales/ or rhonchi. no accessory muscle use, no dullness to percussion  CARD:  RRR, no m/r/g, tr  peripheral edema, pulses intact, no cyanosis or clubbing.  GI:   Soft & nt; nml bowel sounds; no organomegaly or masses detected.   Musco: Warm bil, no deformities or joint swelling noted.   Neuro: alert, no focal deficits noted.    Skin: Warm, no lesions or rashes    Lab Results:Reviewed 03/13/2024   CBC     Imaging:      Latest Ref Rng & Units 02/19/2023    9:07 AM 12/15/2020    3:04 PM  PFT Results  FVC-Pre L 2.40  1.83   FVC-Predicted Pre % 55  50   FVC-Post L 2.72  1.68   FVC-Predicted Post % 62  45   Pre FEV1/FVC % % 80  79   Post FEV1/FCV % % 77  83   FEV1-Pre L 1.92  1.45   FEV1-Predicted Pre % 56  49   FEV1-Post L 2.09  1.39   DLCO uncorrected ml/min/mmHg 17.53  21.12   DLCO UNC% % 67  81   DLCO corrected ml/min/mmHg 18.44    DLCO COR %Predicted % 71    DLVA Predicted % 112  128   TLC L 4.48  4.68   TLC % Predicted % 73  76   RV % Predicted % 85  92     No results found for: NITRICOXIDE      No data to display              Assessment & Plan:   Assessment and Plan Assessment & Plan Sarcoidosis  -with multisystem involvement  including pulmonary, cardiac and eyes Recent PET cardiac CT with active cardiac inflammation consistent with sarcoidosis.  CT chest portion showed mild bibasilar scarring atelectasis and noncalcified mediastinal lymph nodes largest right paratracheal node. Followed by ophthalmology for history of uveitis on weekly methotrexate  at 12.5 mg weekly  She prefers local care and is reluctant to travel to [facility] or [facility] for further evaluation.  Check chest x-ray today. Repeat PFTs on follow-up visit.  Discussed referral to Dr. Kassie for Sarcoidosis.   Obstructive sleep apnea   Obstructive sleep apnea is managed with BiPAP and supplemental oxygen . She reports dissatisfaction with BiPAP due to discomfort and its impact on sleep quality. She is not a candidate for Inspire therapy due to high BMI and the need for supplemental oxygen . Chronic insomnia is exacerbated by anxiety, depression, and chronic pain. Her current BMI is 43, with a goal to reduce weight to 280 lbs to potentially qualify for Inspire therapy. Continuation of BiPAP with supplemental oxygen  is advised.  Encouraged on healthy sleep regimen.  Would avoid sleep aids if possible.  Weight loss is encouraged to potentially qualify for Inspire therapy in the future, and sleep hygiene strategies are discussed to improve sleep quality.  Referral to set up follow-up with cardiology.  Anxiety, depression and chronic stress.  Continue follow-up primary care.  Discussed referral for counseling.  Patient says she will discuss this with her primary care provider.  Morbid obesity.  Encouraged on healthy weight management  History of pulmonary embolism-continue on anticoagulation therapy with Eliquis  Patient education given.    Plan  Patient Instructions  Continue on Methotrexate .  Follow up with Rheumatology next month as planned  Follow up with Ophthalmology as planned this sprin.  Continue on BIPAP At bedtime  with Oxygen  .  Albuterol   inhaler As needed   Continue on Eliquis  5mg  Twice daily   Chest xray today.  Refer to Cardiology for follow up .  Follow up with Dr. Kassie in 4 months with PFT (new patient slot -Sarcoid)           Fischer Halley, NP 03/13/2024       [1] No Known Allergies [2]  Social History Tobacco Use  Smoking Status Former   Current packs/day: 0.00   Types: Cigarettes   Quit date: 2007   Years since quitting: 19.0  Smokeless Tobacco Never   "

## 2024-03-18 ENCOUNTER — Other Ambulatory Visit: Payer: Self-pay | Admitting: Hematology and Oncology

## 2024-03-18 ENCOUNTER — Telehealth: Payer: Self-pay

## 2024-03-18 ENCOUNTER — Other Ambulatory Visit: Payer: Self-pay | Admitting: Pharmacist

## 2024-03-18 MED ORDER — CYANOCOBALAMIN 1000 MCG/ML IJ SOLN: 1000.0000 ug | INTRAMUSCULAR | 3 refills | Status: AC

## 2024-03-18 NOTE — Telephone Encounter (Signed)
 Pt called wondering if she could get her RX for her Vitamin B12 injection sent to her pharmacy for self administration. Odean MD put the order in at her pharmacy. RN called pt and educated her on how to self administer the injection. RN educated pt that she would be receiving a 10 ml vial of the B12 and that she would need to draw up 1ml in a syringe and administer the injection intramuscularly in either her deltoid or vastus lateralis. Pt also instructed to seek further education from her pharmacist if she needs further instruction or to call us  back if needed. Pt verbalized understanding. Pharmacist Norleen Parkinson notified to adjust pt treatment plan.

## 2024-03-18 NOTE — Progress Notes (Signed)
 Patient wishes to receive B12 injections at home.  I sent a prescription to her pharmacy.

## 2024-03-19 ENCOUNTER — Other Ambulatory Visit: Payer: Self-pay | Admitting: Pharmacist

## 2024-03-19 ENCOUNTER — Inpatient Hospital Stay

## 2024-03-25 ENCOUNTER — Ambulatory Visit: Payer: Self-pay | Admitting: Adult Health

## 2024-04-16 ENCOUNTER — Inpatient Hospital Stay

## 2024-04-16 ENCOUNTER — Inpatient Hospital Stay: Attending: Hematology and Oncology

## 2024-05-05 ENCOUNTER — Ambulatory Visit (HOSPITAL_BASED_OUTPATIENT_CLINIC_OR_DEPARTMENT_OTHER): Admitting: Cardiology

## 2024-05-14 ENCOUNTER — Inpatient Hospital Stay

## 2024-05-14 ENCOUNTER — Inpatient Hospital Stay: Attending: Hematology and Oncology

## 2024-06-11 ENCOUNTER — Inpatient Hospital Stay: Attending: Hematology and Oncology

## 2024-06-11 ENCOUNTER — Inpatient Hospital Stay

## 2024-07-09 ENCOUNTER — Inpatient Hospital Stay

## 2024-07-11 ENCOUNTER — Inpatient Hospital Stay: Attending: Hematology and Oncology

## 2024-07-23 ENCOUNTER — Encounter (HOSPITAL_BASED_OUTPATIENT_CLINIC_OR_DEPARTMENT_OTHER)

## 2024-07-23 ENCOUNTER — Encounter (HOSPITAL_BASED_OUTPATIENT_CLINIC_OR_DEPARTMENT_OTHER): Admitting: Pulmonary Disease

## 2024-08-06 ENCOUNTER — Inpatient Hospital Stay

## 2024-08-11 ENCOUNTER — Inpatient Hospital Stay: Attending: Hematology and Oncology

## 2024-09-03 ENCOUNTER — Inpatient Hospital Stay

## 2024-09-10 ENCOUNTER — Inpatient Hospital Stay: Attending: Hematology and Oncology

## 2024-10-01 ENCOUNTER — Inpatient Hospital Stay

## 2024-10-10 ENCOUNTER — Inpatient Hospital Stay: Attending: Hematology and Oncology

## 2024-10-29 ENCOUNTER — Inpatient Hospital Stay

## 2024-11-11 ENCOUNTER — Inpatient Hospital Stay: Attending: Hematology and Oncology

## 2024-11-26 ENCOUNTER — Inpatient Hospital Stay

## 2024-12-11 ENCOUNTER — Inpatient Hospital Stay: Attending: Hematology and Oncology

## 2024-12-24 ENCOUNTER — Inpatient Hospital Stay

## 2025-01-12 ENCOUNTER — Inpatient Hospital Stay: Attending: Hematology and Oncology | Admitting: Hematology and Oncology

## 2025-01-12 ENCOUNTER — Inpatient Hospital Stay

## 2025-01-21 ENCOUNTER — Inpatient Hospital Stay: Admitting: Hematology and Oncology

## 2025-01-21 ENCOUNTER — Inpatient Hospital Stay
